# Patient Record
Sex: Female | Born: 1955 | Race: White | Hispanic: No | Marital: Married | Smoking: Never smoker
Health system: Southern US, Community
[De-identification: ages and names within clinical notes are randomized; demographics above are authoritative.]

## PROBLEM LIST (undated history)

## (undated) DIAGNOSIS — E039 Hypothyroidism, unspecified: Secondary | ICD-10-CM

## (undated) DIAGNOSIS — I2699 Other pulmonary embolism without acute cor pulmonale: Secondary | ICD-10-CM

## (undated) DIAGNOSIS — C801 Malignant (primary) neoplasm, unspecified: Secondary | ICD-10-CM

## (undated) DIAGNOSIS — R51 Headache: Secondary | ICD-10-CM

## (undated) DIAGNOSIS — T4145XA Adverse effect of unspecified anesthetic, initial encounter: Secondary | ICD-10-CM

## (undated) DIAGNOSIS — E669 Obesity, unspecified: Secondary | ICD-10-CM

## (undated) DIAGNOSIS — R2243 Localized swelling, mass and lump, lower limb, bilateral: Secondary | ICD-10-CM

## (undated) DIAGNOSIS — R42 Dizziness and giddiness: Secondary | ICD-10-CM

## (undated) DIAGNOSIS — E1169 Type 2 diabetes mellitus with other specified complication: Secondary | ICD-10-CM

## (undated) DIAGNOSIS — R519 Headache, unspecified: Secondary | ICD-10-CM

## (undated) DIAGNOSIS — I10 Essential (primary) hypertension: Secondary | ICD-10-CM

## (undated) DIAGNOSIS — R202 Paresthesia of skin: Secondary | ICD-10-CM

## (undated) DIAGNOSIS — R112 Nausea with vomiting, unspecified: Secondary | ICD-10-CM

## (undated) DIAGNOSIS — R600 Localized edema: Secondary | ICD-10-CM

## (undated) DIAGNOSIS — T8859XA Other complications of anesthesia, initial encounter: Secondary | ICD-10-CM

## (undated) DIAGNOSIS — E785 Hyperlipidemia, unspecified: Secondary | ICD-10-CM

## (undated) DIAGNOSIS — F32A Depression, unspecified: Secondary | ICD-10-CM

## (undated) DIAGNOSIS — M1990 Unspecified osteoarthritis, unspecified site: Secondary | ICD-10-CM

## (undated) DIAGNOSIS — K219 Gastro-esophageal reflux disease without esophagitis: Secondary | ICD-10-CM

## (undated) DIAGNOSIS — Z9889 Other specified postprocedural states: Secondary | ICD-10-CM

## (undated) DIAGNOSIS — M542 Cervicalgia: Secondary | ICD-10-CM

## (undated) DIAGNOSIS — F329 Major depressive disorder, single episode, unspecified: Secondary | ICD-10-CM

## (undated) DIAGNOSIS — R200 Anesthesia of skin: Secondary | ICD-10-CM

## (undated) DIAGNOSIS — R0600 Dyspnea, unspecified: Secondary | ICD-10-CM

## (undated) HISTORY — DX: Hyperlipidemia, unspecified: E78.5

## (undated) HISTORY — DX: Gastro-esophageal reflux disease without esophagitis: K21.9

## (undated) HISTORY — DX: Essential (primary) hypertension: I10

## (undated) HISTORY — PX: OTHER SURGICAL HISTORY: SHX169

## (undated) HISTORY — PX: THYROIDECTOMY: SHX17

---
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
## 2004-12-12 ENCOUNTER — Ambulatory Visit: Payer: Self-pay | Admitting: Family Medicine

## 2006-05-27 ENCOUNTER — Other Ambulatory Visit: Payer: Self-pay

## 2006-05-27 ENCOUNTER — Emergency Department: Payer: Self-pay | Admitting: Emergency Medicine

## 2007-01-22 ENCOUNTER — Ambulatory Visit: Payer: Self-pay | Admitting: Family Medicine

## 2007-01-29 ENCOUNTER — Ambulatory Visit: Payer: Self-pay | Admitting: Family Medicine

## 2007-04-25 ENCOUNTER — Ambulatory Visit: Payer: Self-pay | Admitting: Unknown Physician Specialty

## 2007-06-30 ENCOUNTER — Emergency Department: Payer: Self-pay | Admitting: Emergency Medicine

## 2007-07-25 ENCOUNTER — Ambulatory Visit (HOSPITAL_COMMUNITY): Admission: RE | Admit: 2007-07-25 | Discharge: 2007-07-26 | Payer: Self-pay | Admitting: Surgery

## 2007-07-25 ENCOUNTER — Encounter (INDEPENDENT_AMBULATORY_CARE_PROVIDER_SITE_OTHER): Payer: Self-pay | Admitting: Surgery

## 2007-08-30 ENCOUNTER — Inpatient Hospital Stay: Payer: Self-pay | Admitting: Internal Medicine

## 2007-08-30 ENCOUNTER — Ambulatory Visit: Payer: Self-pay | Admitting: Family Medicine

## 2007-08-30 ENCOUNTER — Other Ambulatory Visit: Payer: Self-pay

## 2007-10-21 ENCOUNTER — Ambulatory Visit (HOSPITAL_COMMUNITY): Admission: RE | Admit: 2007-10-21 | Discharge: 2007-10-21 | Payer: Self-pay | Admitting: Surgery

## 2007-10-23 ENCOUNTER — Ambulatory Visit (HOSPITAL_COMMUNITY): Admission: RE | Admit: 2007-10-23 | Discharge: 2007-10-23 | Payer: Self-pay | Admitting: Surgery

## 2007-11-01 ENCOUNTER — Encounter (HOSPITAL_COMMUNITY): Admission: RE | Admit: 2007-11-01 | Discharge: 2008-01-30 | Payer: Self-pay | Admitting: Surgery

## 2007-12-11 ENCOUNTER — Emergency Department: Payer: Self-pay | Admitting: Emergency Medicine

## 2008-03-19 ENCOUNTER — Ambulatory Visit: Payer: Self-pay | Admitting: Family Medicine

## 2008-07-09 ENCOUNTER — Ambulatory Visit: Payer: Self-pay | Admitting: Family Medicine

## 2008-07-10 ENCOUNTER — Ambulatory Visit: Payer: Self-pay | Admitting: Family Medicine

## 2009-01-26 ENCOUNTER — Ambulatory Visit: Payer: Self-pay | Admitting: Family Medicine

## 2009-04-01 ENCOUNTER — Ambulatory Visit: Payer: Self-pay | Admitting: Family Medicine

## 2009-07-26 ENCOUNTER — Ambulatory Visit: Payer: Self-pay | Admitting: Internal Medicine

## 2009-08-25 ENCOUNTER — Emergency Department: Payer: Self-pay | Admitting: Emergency Medicine

## 2009-08-25 ENCOUNTER — Encounter: Payer: Self-pay | Admitting: Cardiovascular Disease

## 2009-09-22 ENCOUNTER — Ambulatory Visit: Payer: Self-pay | Admitting: Cardiovascular Disease

## 2009-09-22 DIAGNOSIS — E785 Hyperlipidemia, unspecified: Secondary | ICD-10-CM | POA: Insufficient documentation

## 2009-09-22 DIAGNOSIS — R079 Chest pain, unspecified: Secondary | ICD-10-CM | POA: Insufficient documentation

## 2009-09-22 DIAGNOSIS — E119 Type 2 diabetes mellitus without complications: Secondary | ICD-10-CM | POA: Insufficient documentation

## 2009-09-22 DIAGNOSIS — R5383 Other fatigue: Secondary | ICD-10-CM

## 2009-09-22 DIAGNOSIS — R609 Edema, unspecified: Secondary | ICD-10-CM | POA: Insufficient documentation

## 2009-09-22 DIAGNOSIS — I10 Essential (primary) hypertension: Secondary | ICD-10-CM

## 2009-09-22 DIAGNOSIS — R5381 Other malaise: Secondary | ICD-10-CM | POA: Insufficient documentation

## 2010-07-24 ENCOUNTER — Encounter: Payer: Self-pay | Admitting: Surgery

## 2010-08-02 NOTE — Progress Notes (Signed)
Summary: PHI  PHI   Imported By: Harlon Flor 09/23/2009 09:10:17  _____________________________________________________________________  External Attachment:    Type:   Image     Comment:   External Document

## 2010-08-02 NOTE — Assessment & Plan Note (Signed)
Summary: NP6/AMD   Visit Type:  new patient  Referring Provider:  [facility] Primary Provider:  Jerl Mina, MD  CC:  numbness in hands and lower arms. no cp's. no sob. edema everyday...  History of Present Illness: Alisha Sanchez is a pleasant 55 year old woman, patient of Dr. Burnett Sheng,  seen in the emergency room in February of this year for generalized malaise, arm pain, back pain, nausea and "feeling funny all over ".  she had a chest CT scan to rule out PE, chest x-ray, routine blood work which was negative. EKG showed no notable changes. She was discharged home. Since then she has had no further episodes. She has had significant stress at home. She reports occasional nausea, some tingling in her arms bilaterally and below the elbow to her hands on a very occasional basis. 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 Otherwise she feels well  She does not do any exercise program I was very active at baseline and has no significant symptoms as detailed above. No lightheadedness, no dizziness. the nausea comes on sometimes at rest and she is uncertain if this is associated with food   Medications from what she can remember include HCTZ, metformin 1000 mg b.i.d., simvastatin. She's also on a second blood pressure medication. She is uncertain of her doses.  Preventive Screening-Counseling & Management  Alcohol-Tobacco     Alcohol drinks/day: <1     Smoking Status: never  Caffeine-Diet-Exercise     Caffeine use/day: 3      Does Patient Exercise: yes      Drug Use:  no.    Current Problems (verified): 1)  Other and Unspecified Hyperlipidemia  (ICD-272.4) 2)  Unspecified Essential Hypertension  (ICD-401.9) 3)  Chest Pain Unspecified  (ICD-786.50)  Allergies (verified): No Known Drug Allergies  Past History:  Past Medical History: G E R  D Hyperlipidemia Hypertension  Past Surgical History: thyroidectomy 2 c-sections  Family History: Mother: living 82: valve replaced Father: deceased 47: lung cancer and bone marrow cancer and liver cancer Sister: living 21 : heathly Brother: living 73: healthy  Social History: Full Time Married  Tobacco Use - No.  Alcohol Use - yes Regular Exercise - yes Drug Use - no Alcohol drinks/day:  <1 Smoking Status:  never Caffeine use/day:  3  Does Patient Exercise:  yes Drug Use:  no  Review of Systems  The patient denies fatigue, malaise, fever, weight gain/loss, vision loss, decreased hearing, hoarseness, chest pain, palpitations, shortness of breath, prolonged cough, wheezing, sleep apnea, coughing up blood, abdominal pain, blood in stool, nausea, vomiting, diarrhea, heartburn, incontinence, blood in urine, muscle weakness, joint pain, leg swelling, rash, skin lesions, headache, fainting, dizziness, depression, anxiety, enlarged lymph nodes, easy bruising or bleeding, environmental allergies, and dyspnea on exertion.         Nausea, arm tingling and numbness, back pain  Vital Signs:  Patient profile:   55 year old female Height:      67 inches Weight:      210.50 pounds BMI:     33.09 Pulse rate:   87 / minute Pulse rhythm:   regular BP sitting:   140 / 94  (left arm) Cuff size:   large  Vitals Entered By: Alisha Sanchez (September 22, 2009 3:42 PM)  Physical Exam  General:  well-appearing middle-aged woman in no apparent distress,  HENT exam is benign, neck is supple with no JVP or carotid bruits, heart sounds are regular with S1-S2 and no murmurs appreciated, lungs are clear to auscultation with no wheezes Rales, abdominal exam is benign, she has trace to 1+ lower extremity edema to below the knees, neurologic exam grossly nonfocal. Skin is warm and dry. Pulses are equal and symmetrical in her upper and lower extremities.    EKG  Procedure date:   08/25/2009  Findings:      normal sinus rhythm with rate of 81 beats per minute, no significant ST or T wave changes.  Impression & Recommendations:  Problem # 1:  FATIGUE / MALAISE (ICD-780.79) etiology of her symptoms of malaise, nausea or, arm and back pain is uncertain. Back in February when her symptoms last presented, she had negative CT and negative workup at that time. She's otherwise been very reactive and seems to not have any active cardiac symptoms suggestive of ischemia.  I suggested that we hold off on any additional workup unless she has worsening symptoms with exertion to suggest cardiac etiology.  Problem # 2:  DIAB W/O COMP TYPE II/UNS NOT STATED UNCNTRL (ICD-250.00) type 2 diabetes. She states that her sugars have been in the 100s to 130 range. She takes metformin 1000 mg b.i.d. She is uncertain of her cholesterol and takes simvastatin though she does not know the dose. We'll try to obtain a copy of her lipid panel for our records.  Her updated medication list for this problem includes:    Metformin Hcl 1000 Mg Tabs (Metformin hcl) .Marland Kitchen... 1 by mouth once daily  Problem # 3:  EDEMA (ICD-782.3) Her edema is likely due to chronic venous stasis. I suggested that she purchase some TED hose and I have given her a prescription for this. She does comment on having a dry mouth all the time and I've asked her to talk about possibly changing her HCTZ to an alternate blood pressure medication as she states this medication has not significantly helped her edema.  Patient Instructions: 1)  Your physician recommends that you schedule a follow-up appointment as needed. 2)  Your physician has requested you begin wearing TED hose.

## 2010-11-15 NOTE — Op Note (Signed)
NAME:  Alisha Sanchez, Alisha Sanchez          ACCOUNT NO.:  [id number]   MEDICAL RECORD NO.:  [id number]          PATIENT TYPE:  AMB   LOCATION:  DAY                          FACILITY:  [facility]   PHYSICIAN:  Velora Heckler, MD      DATE OF BIRTH:  10/03/1955   DATE OF PROCEDURE:  07/25/2007  DATE OF DISCHARGE:                               OPERATIVE REPORT   PREOPERATIVE DIAGNOSIS:  Thyroid goiter with compressive symptoms.   POSTOPERATIVE DIAGNOSIS:  Thyroid goiter with substernal component,  compressive symptoms.   PROCEDURE:  Total thyroidectomy.   SURGEON:  Velora Heckler, M.D., FACS   ASSISTANT:  Ollen Gross. Eudelia Bunch., M.D., FACS   ANESTHESIA:  General.   ESTIMATED BLOOD LOSS:  Minimal.   PREPARATION:  Betadine.   COMPLICATIONS:  None.   INDICATIONS:  The patient is a 55 year old white female from [place], [place].  She developed compressive symptoms due to thyroid  enlargement.  She first noted symptoms June 2008.  She was evaluated by  Dr. Silver Huguenin at the [facility].  The patient has both solid  and liquid food dysphagia.  She now comes to surgery for thyroidectomy.   BODY OF REPORT:  Procedure was done in OR #1 at the [facility].  The patient was brought to the operating room and  placed in supine position on the operating room table.  Following  administration of general anesthesia, the patient was positioned and  then prepped and draped in usual strict aseptic fashion.  After  ascertaining that an adequate level of anesthesia had been achieved, a  Kocher incision was made a #15 blade.  Dissection was carried through  subcutaneous tissues and platysma.  Hemostasis was obtained with  electrocautery.  Skin flaps were elevated cephalad and caudad from the  thyroid notch to the sternal notch.  A Mahorner self-retaining retractor  was placed for exposure.  Strap muscles were incised in the midline, and  dissection was begun on the left side.  There  was a large nodule in the  isthmus.  There was a large nodule occupying the left thyroid lobe.  There was a large posterior nodule extending behind the esophagus to the  precervical fascia.  There was an inferior component extending into the  posterior mediastinum near to the base of the heart.  With gentle blunt  dissection, this was mobilized.  Middle thyroid vein was divided between  Ligaclips with the harmonic scalpel.  Inferior venous tributaries were  divided between hemoclips with the harmonic scalpel.  Superior pole  vessels were dissected out and ligated in continuity with 2-0 silk ties  and medium hemoclips and divided with the harmonic scalpel.  Gland was  gradually rolled anteriorly.  Both superior and inferior parathyroid  glands were identified and preserved on their vascular pedicle.  Care  was taken to avoid the recurrent laryngeal nerve.  Gland was delivered  out of the posterior mediastinum.  It was rolled anteriorly.  Branches  of the inferior thyroid artery were divided between small hemoclips with  the harmonic scalpel.  Ligament of Allyson Sabal was  transected, and the gland  was mobilized up and onto the anterior trachea.  There was no  significant pyramidal lobe.  Gland was mobilized across the midline.  Inferior venous tributaries in the midline were ligated in continuity  with 2-0 silk ties and divided with the harmonic scalpel.  Dry pack was  placed in the left neck.   Next, we turned our attention to the right thyroid lobe.  Again strap  muscles were reflected laterally.  Right lobe was mobilized with a  Administrator[employer].  Anatomy was much more normal on the right side.  Middle vein was divided between hemoclips with the harmonic scalpel.  Superior pole vessels were dissected out, ligated in continuity with 2-0  silk ties, and medium hemoclips and divided with the harmonic scalpel.  Gland was rolled anteriorly.  Branches of the inferior thyroid artery  were divided  between small hemoclips.  Parathyroid tissue and recurrent  nerve were identified and preserved.  Ligament of Allyson Sabal was transected  with electrocautery, and the gland was delivered up and onto the  anterior trachea.  Remaining inferior venous tributaries were ligated  with a 2-0 silk tie and divided with the harmonic scalpel.  Specimen was  marked with a suture at the left superior pole.  The entire thyroid  gland and associated substernal component were submitted to pathology  for review.  Good hemostasis was obtained after irrigating the neck.  Surgicel was placed in the operative field bilaterally.  Strap muscles  were reapproximated in the midline with interrupted 3-0 Vicryl sutures.  Platysma was closed with interrupted 3-0 Vicryl sutures.  Skin was  closed with a running 4-0 Monocryl subcuticular suture.  Wound was  washed and dried, and Benzoin and Steri-Strips were applied.  Sterile  dressings were applied.  The patient was awakened from anesthesia and  brought to the recovery room in stable condition.  The patient tolerated  the procedure well.      Velora Heckler, MD  Electronically Signed     TMG/MEDQ  D:  07/25/2007  T:  07/25/2007  Job:  [id number]   cc:   Silver Huguenin, M.D.  [facility]  [address] [place]  Fax: [phone]   Velora Heckler, MD  [phone] [place]  [place] [address]

## 2011-01-17 ENCOUNTER — Encounter: Payer: Self-pay | Admitting: Cardiovascular Disease

## 2011-03-01 ENCOUNTER — Ambulatory Visit: Payer: Self-pay | Admitting: Family Medicine

## 2011-03-06 ENCOUNTER — Emergency Department: Payer: Self-pay | Admitting: Emergency Medicine

## 2011-03-23 LAB — CBC
MCHC: 34.9
MCV: 80.1
Platelets: 403 — ABNORMAL HIGH
WBC: 6.7

## 2011-03-23 LAB — BASIC METABOLIC PANEL
CO2: 26
Calcium: 9.5
Creatinine, Ser: 0.72
GFR calc non Af Amer: >60
Glucose, Bld: 112 — ABNORMAL HIGH

## 2011-03-23 LAB — URINALYSIS, ROUTINE W REFLEX MICROSCOPIC
Glucose, UA: NEGATIVE
Ketones, ur: NEGATIVE
Protein, ur: NEGATIVE
Urobilinogen, UA: 0.2

## 2011-03-23 LAB — DIFFERENTIAL
Basophils Relative: 1
Eosinophils Absolute: 0.1
Neutrophils Relative %: 62

## 2011-03-23 LAB — PROTIME-INR
INR: 0.9
Prothrombin Time: 12.2

## 2011-04-17 ENCOUNTER — Emergency Department: Payer: Self-pay | Admitting: Emergency Medicine

## 2011-07-20 ENCOUNTER — Emergency Department (HOSPITAL_COMMUNITY): Payer: BC Managed Care – PPO

## 2011-07-20 ENCOUNTER — Encounter (HOSPITAL_COMMUNITY): Payer: Self-pay | Admitting: Emergency Medicine

## 2011-07-20 ENCOUNTER — Emergency Department (HOSPITAL_COMMUNITY)
Admission: EM | Admit: 2011-07-20 | Discharge: 2011-07-20 | Disposition: A | Discharge status: Home / Self Care | Payer: BC Managed Care – PPO | Attending: Emergency Medicine | Admitting: Emergency Medicine

## 2011-07-20 DIAGNOSIS — E669 Obesity, unspecified: Secondary | ICD-10-CM | POA: Insufficient documentation

## 2011-07-20 DIAGNOSIS — E119 Type 2 diabetes mellitus without complications: Secondary | ICD-10-CM | POA: Insufficient documentation

## 2011-07-20 DIAGNOSIS — R51 Headache: Secondary | ICD-10-CM | POA: Insufficient documentation

## 2011-07-20 DIAGNOSIS — K219 Gastro-esophageal reflux disease without esophagitis: Secondary | ICD-10-CM | POA: Insufficient documentation

## 2011-07-20 DIAGNOSIS — I10 Essential (primary) hypertension: Secondary | ICD-10-CM | POA: Insufficient documentation

## 2011-07-20 DIAGNOSIS — Z86718 Personal history of other venous thrombosis and embolism: Secondary | ICD-10-CM | POA: Insufficient documentation

## 2011-07-20 DIAGNOSIS — E785 Hyperlipidemia, unspecified: Secondary | ICD-10-CM | POA: Insufficient documentation

## 2011-07-20 DIAGNOSIS — R519 Headache, unspecified: Secondary | ICD-10-CM

## 2011-07-20 HISTORY — DX: Type 2 diabetes mellitus with other specified complication: E66.9

## 2011-07-20 HISTORY — DX: Type 2 diabetes mellitus with other specified complication: E11.69

## 2011-07-20 HISTORY — DX: Other pulmonary embolism without acute cor pulmonale: I26.99

## 2011-07-20 MED ORDER — SODIUM CHLORIDE 0.9 % IV BOLUS (SEPSIS)
1000.0000 mL | Freq: Once | INTRAVENOUS | Status: AC
  Administered 2011-07-20: 1000 mL via INTRAVENOUS

## 2011-07-20 MED ORDER — ACETAMINOPHEN 500 MG PO TABS
1000.0000 mg | ORAL_TABLET | Freq: Once | ORAL | Status: AC
  Administered 2011-07-20: 1000 mg via ORAL
  Filled 2011-07-20: qty 2

## 2011-07-20 NOTE — Discharge Instructions (Signed)
CT scan of head was normal. Rest. Increase fluids. Tylenol or ibuprofen for headache

## 2011-07-20 NOTE — ED Notes (Signed)
Pt placed on 2 LMP [place] per order. Pt states headache is worsening. Tylenol given per orders.

## 2011-07-20 NOTE — ED Notes (Signed)
Pt states sudden onset of severe pain at base of head that radiated up to top of forehead from back of head. Pt states has nausea but denies vomiting. Pt complaining of sensitivity to light and blurred vision. Pt complaining of dull ache with head heaviness at present time.

## 2011-07-20 NOTE — ED Provider Notes (Signed)
History   This chart was scribed for Alisha Hutching, MD by Clarita Crane. The patient was seen in room APA06/APA06 and the patient's care was started at 11:17AM.   CSN: [id number]  Arrival date & time 07/20/11  1035   First MD Initiated Contact with Patient 07/20/11 1054      Chief Complaint  Patient presents with  . Headache    (Consider location/radiation/quality/duration/timing/severity/associated sxs/prior treatment) HPI Alisha MCARTHY is a 56 y.o. female who presents to the Emergency Department complaining of constant moderate to severe HA described as shooting and throbbing which extended from inferior aspect of neck radiating to posterior aspect of head and persistent since but moderately improved since initial onset. Reports HA began suddenly when she went from seated position to standing position. Denies weakness, confusion, aphasia. Patient with h/o GERD, HLD, HTN, diabetes- type 2, PE.   Past Medical History  Diagnosis Date  . GERD (gastroesophageal reflux disease)   . HLD (hyperlipidemia)   . HTN (hypertension)   . Diabetes mellitus type 2 in obese   . Pulmonary embolus     Past Surgical History  Procedure Date  . Thyroidectomy   . C-sections     x2     Family History  Problem Relation Age of Onset  . Liver cancer Father   . Lung cancer Father   . Diabetes Father   . Stroke Other     History  Substance Use Topics  . Smoking status: Never Smoker   . Smokeless tobacco: Not on file   Comment: tobacco use- no  . Alcohol Use: Yes     occassional wine    OB History    Grav Para Term Preterm Abortions TAB SAB Ect Mult Living                  Review of Systems 10 Systems reviewed and are negative for acute change except as noted in the HPI.  Allergies  Review of patient's allergies indicates no known allergies.  Home Medications   Current Outpatient Rx  Name Route Sig Dispense Refill  . AMLODIPINE BESYLATE 5 MG PO TABS Oral Take 5 mg by mouth  daily.    . CYCLOBENZAPRINE HCL 5 MG PO TABS Oral Take 5 mg by mouth 3 (three) times daily as needed. Muscle spasms    . LEVOTHYROXINE SODIUM 112 MCG PO TABS Oral Take 112 mcg by mouth daily.    Marland Kitchen METFORMIN HCL ER 500 MG PO TB24 Oral Take 500 mg by mouth daily with breakfast.    . ONDANSETRON HCL 4 MG PO TABS Oral Take 4 mg by mouth every 8 (eight) hours as needed. nausea    . PANTOPRAZOLE SODIUM 40 MG PO TBEC Oral Take 40 mg by mouth daily.    Marland Kitchen ROSUVASTATIN CALCIUM 10 MG PO TABS Oral Take 10 mg by mouth daily.    . VENLAFAXINE HCL ER 37.5 MG PO CP24 Oral Take 37.5 mg by mouth daily.      BP 142/78  Pulse 80  Temp(Src) 98 F (36.7 C) (Oral)  Resp 18  Ht 5\' 7"  (1.702 m)  Wt 211 lb (95.709 kg)  BMI 33.05 kg/m2  SpO2 98%  Physical Exam  Nursing note and vitals reviewed. Constitutional: She is oriented to person, place, and time. She appears well-developed and well-nourished. No distress.  HENT:  Head: Normocephalic and atraumatic.  Eyes: EOM are normal. Pupils are equal, round, and reactive to light. Right eye exhibits no discharge.  Left eye exhibits no discharge. No scleral icterus.  Neck: Neck supple. No tracheal deviation present.  Cardiovascular: Normal rate and regular rhythm.   No murmur heard. Pulmonary/Chest: Effort normal. No respiratory distress. She has no wheezes.  Abdominal: Soft. She exhibits no distension.  Musculoskeletal: Normal range of motion. She exhibits no edema.  Neurological: She is alert and oriented to person, place, and time. No sensory deficit.  Skin: Skin is warm and dry.  Psychiatric: She has a normal mood and affect. Her behavior is normal.    ED Course  Procedures (including critical care time)  DIAGNOSTIC STUDIES: Oxygen Saturation is 98% on room air, normal by my interpretation.    COORDINATION OF CARE: 11:19AM- Patient explained treatment plan at this time and intent to obtain CT-Head. Patient agrees with plan set forth at this time.     Labs Reviewed - No data to display Ct Head Wo Contrast  07/20/2011  *RADIOLOGY REPORT*  Clinical Data: Severe occipital headache, history of diabetes, hypertension, GERD, thyroidectomy, pulmonary embolism  CT HEAD WITHOUT CONTRAST  Technique:  Contiguous axial images were obtained from the base of the skull through the vertex without contrast.  Comparison: None  Findings: Normal ventricular morphology. No midline shift or mass effect. Normal appearance of brain parenchyma. No intracranial hemorrhage, mass lesion, or acute infarction. Visualized paranasal sinuses and mastoid air cells clear. Bones demineralized.  IMPRESSION: No acute intracranial abnormalities.  Original Report Authenticated By: Lollie Marrow, M.D.     No diagnosis found.    MDM  Patient has normal neurological exam. Head CT normal. Headache dramatically decreased.       I personally performed the services described in this documentation, which was scribed in my presence. The recorded information has been reviewed and considered.    Alisha Hutching, MD 07/20/11 1426

## 2012-03-05 ENCOUNTER — Ambulatory Visit: Payer: Self-pay | Admitting: Family Medicine

## 2013-08-11 ENCOUNTER — Emergency Department: Payer: Self-pay | Admitting: Emergency Medicine

## 2013-08-11 LAB — BASIC METABOLIC PANEL
Anion Gap: 8 (ref 7–16)
BUN: 14 mg/dL (ref 7–18)
CHLORIDE: 105 mmol/L (ref 98–107)
CO2: 26 mmol/L (ref 21–32)
Calcium, Total: 9 mg/dL (ref 8.5–10.1)
Creatinine: 0.77 mg/dL (ref 0.60–1.30)
EGFR (African American): >60
GLUCOSE: 172 mg/dL — AB (ref 65–99)
OSMOLALITY: 282 (ref 275–301)
POTASSIUM: 3.2 mmol/L — AB (ref 3.5–5.1)
Sodium: 139 mmol/L (ref 136–145)

## 2013-08-11 LAB — CBC
HCT: 39.1 % (ref 35.0–47.0)
HGB: 13.8 g/dL (ref 12.0–16.0)
MCH: 28.6 pg (ref 26.0–34.0)
MCHC: 35.2 g/dL (ref 32.0–36.0)
MCV: 81 fL (ref 80–100)
PLATELETS: 278 10*3/uL (ref 150–440)
RBC: 4.82 10*6/uL (ref 3.80–5.20)
RDW: 14.8 % — AB (ref 11.5–14.5)
WBC: 6 10*3/uL (ref 3.6–11.0)

## 2013-08-11 LAB — TROPONIN I: Troponin-I: <0.02 ng/mL

## 2013-08-11 LAB — PRO B NATRIURETIC PEPTIDE: B-TYPE NATIURETIC PEPTID: 16 pg/mL (ref 0–125)

## 2013-09-09 ENCOUNTER — Ambulatory Visit: Payer: Self-pay | Admitting: Ophthalmology

## 2013-09-09 LAB — POTASSIUM: POTASSIUM: 3.2 mmol/L — AB (ref 3.5–5.1)

## 2013-09-29 ENCOUNTER — Ambulatory Visit: Payer: Self-pay | Admitting: Ophthalmology

## 2014-09-07 ENCOUNTER — Other Ambulatory Visit: Payer: Self-pay | Admitting: Adult Health

## 2014-10-24 NOTE — Op Note (Signed)
PATIENT NAME:  Alisha Sanchez, Alisha Sanchez MR#:  [id number] DATE OF BIRTH:  1955/11/15  DATE OF PROCEDURE:  09/29/2013  PREOPERATIVE DIAGNOSIS:  Cataract, right eye.    POSTOPERATIVE DIAGNOSIS:  Cataract, right eye.  PROCEDURE PERFORMED:  Extracapsular cataract extraction using phacoemulsification with placement of an Alcon SN6CWF, 20.5-diopter posterior chamber lens, serial number [id number].  SURGEON:  Loura Back. Lis Savitt, MD  ASSISTANT:  None.  ANESTHESIA:  4% lidocaine and 0.75% Marcaine in a 50/50 mixture with Hylenex 10 units/mL added, given as a peribulbar.  ANESTHESIOLOGIST:  Dr. Myra Gianotti.   COMPLICATIONS:  None.  ESTIMATED BLOOD LOSS:  Less than 1 ml.  DESCRIPTION OF PROCEDURE:  The patient was brought to the operating room and given a peribulbar block.  The patient was then prepped and draped in the usual fashion.  The vertical rectus muscles were imbricated using 5-0 silk sutures.  These sutures were then clamped to the sterile drapes as bridle sutures.  A limbal peritomy was performed extending two clock hours and hemostasis was obtained with cautery.  A partial thickness scleral groove was made at the surgical limbus and dissected anteriorly in a lamellar dissection using an Alcon crescent knife.  The anterior chamber was entered supero-temporally with a Superblade and through the lamellar dissection with a 2.6 mm keratome.  DisCoVisc was used to replace the aqueous and a continuous tear capsulorrhexis was carried out.  Hydrodissection and hydrodelineation were carried out with balanced salt and a 27 gauge canula.  The nucleus was rotated to confirm the effectiveness of the hydrodissection.  Phacoemulsification was carried out using a divide-and-conquer technique.  Total ultrasound time was 1 minute and 8 seconds with an average power of 24.3 percent.  CDE 27.78.    Irrigation/aspiration was used to remove the residual cortex.  DisCoVisc was used to inflate the capsule and the  internal incision was enlarged to 3 mm with the crescent knife.  The intraocular lens was folded and inserted into the capsular bag using the AcrySert delivery system.  Irrigation/aspiration was used to remove the residual DisCoVisc.  Miostat was injected into the anterior chamber through the paracentesis track to inflate the anterior chamber and induce miosis.  Cefuroxime 0.1 mL containing 1 mg of drug was injected via the paracentesis track.  The wound was checked for leaks and none were found. No suture was added.  The conjunctiva was closed with cautery and the bridle sutures were removed.  Two drops of 0.3% Vigamox were placed on the eye.   An eye shield was placed on the eye.  The patient was discharged to the recovery room in good condition.  ____________________________ Loura Back Irean Kendricks, MD sad:cs D: 09/29/2013 13:23:13 ET T: 09/29/2013 15:36:03 ET JOB#: [id number]  cc: Remo Lipps A. Mackinley Cassaday, MD, <Dictator> Martie Lee MD ELECTRONICALLY SIGNED 10/06/2013 12:16

## 2015-03-19 ENCOUNTER — Other Ambulatory Visit: Payer: Self-pay | Admitting: Family Medicine

## 2015-03-19 DIAGNOSIS — Z1231 Encounter for screening mammogram for malignant neoplasm of breast: Secondary | ICD-10-CM

## 2015-04-02 ENCOUNTER — Ambulatory Visit
Admission: RE | Admit: 2015-04-02 | Discharge: 2015-04-02 | Disposition: A | Discharge status: Home / Self Care | Payer: BC Managed Care – PPO | Source: Ambulatory Visit | Attending: Family Medicine | Admitting: Family Medicine

## 2015-04-02 DIAGNOSIS — Z1231 Encounter for screening mammogram for malignant neoplasm of breast: Secondary | ICD-10-CM | POA: Diagnosis not present

## 2015-10-27 ENCOUNTER — Emergency Department
Admission: EM | Admit: 2015-10-27 | Discharge: 2015-10-27 | Disposition: A | Discharge status: Home / Self Care | Payer: BC Managed Care – PPO | Attending: Emergency Medicine | Admitting: Emergency Medicine

## 2015-10-27 ENCOUNTER — Encounter: Payer: Self-pay | Admitting: Radiology

## 2015-10-27 ENCOUNTER — Emergency Department: Payer: BC Managed Care – PPO

## 2015-10-27 ENCOUNTER — Other Ambulatory Visit: Payer: Self-pay

## 2015-10-27 DIAGNOSIS — E119 Type 2 diabetes mellitus without complications: Secondary | ICD-10-CM | POA: Insufficient documentation

## 2015-10-27 DIAGNOSIS — Z7984 Long term (current) use of oral hypoglycemic drugs: Secondary | ICD-10-CM | POA: Diagnosis not present

## 2015-10-27 DIAGNOSIS — E785 Hyperlipidemia, unspecified: Secondary | ICD-10-CM | POA: Diagnosis not present

## 2015-10-27 DIAGNOSIS — M25512 Pain in left shoulder: Secondary | ICD-10-CM

## 2015-10-27 DIAGNOSIS — Z86711 Personal history of pulmonary embolism: Secondary | ICD-10-CM | POA: Diagnosis not present

## 2015-10-27 DIAGNOSIS — I10 Essential (primary) hypertension: Secondary | ICD-10-CM | POA: Diagnosis not present

## 2015-10-27 DIAGNOSIS — M5412 Radiculopathy, cervical region: Secondary | ICD-10-CM | POA: Diagnosis not present

## 2015-10-27 DIAGNOSIS — Z79899 Other long term (current) drug therapy: Secondary | ICD-10-CM | POA: Insufficient documentation

## 2015-10-27 HISTORY — DX: Malignant (primary) neoplasm, unspecified: C80.1

## 2015-10-27 LAB — COMPREHENSIVE METABOLIC PANEL
ALT: 18 U/L (ref 14–54)
AST: 19 U/L (ref 15–41)
Albumin: 4.2 g/dL (ref 3.5–5.0)
Alkaline Phosphatase: 50 U/L (ref 38–126)
Anion gap: 9 (ref 5–15)
BILIRUBIN TOTAL: 0.7 mg/dL (ref 0.3–1.2)
BUN: 14 mg/dL (ref 6–20)
CHLORIDE: 107 mmol/L (ref 101–111)
CO2: 23 mmol/L (ref 22–32)
CREATININE: 0.59 mg/dL (ref 0.44–1.00)
Calcium: 9 mg/dL (ref 8.9–10.3)
Glucose, Bld: 162 mg/dL — ABNORMAL HIGH (ref 65–99)
POTASSIUM: 3.5 mmol/L (ref 3.5–5.1)
Sodium: 139 mmol/L (ref 135–145)
TOTAL PROTEIN: 7.3 g/dL (ref 6.5–8.1)

## 2015-10-27 LAB — CBC WITH DIFFERENTIAL/PLATELET
Basophils Absolute: 0.1 10*3/uL (ref 0–0.1)
Basophils Relative: 2 %
EOS PCT: 2 %
Eosinophils Absolute: 0.1 10*3/uL (ref 0–0.7)
HCT: 39.2 % (ref 35.0–47.0)
Hemoglobin: 13.6 g/dL (ref 12.0–16.0)
LYMPHS ABS: 0.8 10*3/uL — AB (ref 1.0–3.6)
LYMPHS PCT: 14 %
MCH: 28.1 pg (ref 26.0–34.0)
MCHC: 34.6 g/dL (ref 32.0–36.0)
MCV: 81.2 fL (ref 80.0–100.0)
MONO ABS: 0.4 10*3/uL (ref 0.2–0.9)
MONOS PCT: 6 %
Neutro Abs: 4.6 10*3/uL (ref 1.4–6.5)
Neutrophils Relative %: 76 %
PLATELETS: 236 10*3/uL (ref 150–440)
RBC: 4.83 MIL/uL (ref 3.80–5.20)
RDW: 14.1 % (ref 11.5–14.5)
WBC: 6 10*3/uL (ref 3.6–11.0)

## 2015-10-27 LAB — FIBRIN DERIVATIVES D-DIMER (ARMC ONLY): FIBRIN DERIVATIVES D-DIMER (ARMC): 289 (ref 0–499)

## 2015-10-27 LAB — TROPONIN I: Troponin I: <0.03 ng/mL (ref <0.031)

## 2015-10-27 MED ORDER — MORPHINE SULFATE (PF) 4 MG/ML IV SOLN
4.0000 mg | Freq: Once | INTRAVENOUS | Status: AC
  Administered 2015-10-27: 4 mg via INTRAVENOUS
  Filled 2015-10-27: qty 1

## 2015-10-27 MED ORDER — HYDROCODONE-ACETAMINOPHEN 5-325 MG PO TABS: 1.0000 | ORAL_TABLET | ORAL | Status: DC | PRN

## 2015-10-27 MED ORDER — IOPAMIDOL (ISOVUE-370) INJECTION 76%
100.0000 mL | Freq: Once | INTRAVENOUS | Status: AC | PRN
  Administered 2015-10-27: 80 mL via INTRAVENOUS

## 2015-10-27 MED ORDER — DOCUSATE SODIUM 100 MG PO CAPS: ORAL_CAPSULE | ORAL | Status: DC

## 2015-10-27 MED ORDER — SODIUM CHLORIDE 0.9 % IV BOLUS (SEPSIS)
1000.0000 mL | INTRAVENOUS | Status: AC
  Administered 2015-10-27: 1000 mL via INTRAVENOUS

## 2015-10-27 MED ORDER — ONDANSETRON HCL 4 MG/2ML IJ SOLN
4.0000 mg | Freq: Once | INTRAMUSCULAR | Status: AC
Start: 2015-10-27 — End: 2015-10-27
  Administered 2015-10-27: 4 mg via INTRAVENOUS
  Filled 2015-10-27: qty 2

## 2015-10-27 MED ORDER — ONDANSETRON HCL 4 MG PO TABS: ORAL_TABLET | ORAL | Status: DC

## 2015-10-27 NOTE — ED Provider Notes (Signed)
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 I ordered a second troponin and this was also negative. Patient is feeling better at present discharge her as planned by Dr. Bosie Helper, MD 10/27/15 [phone]

## 2015-10-27 NOTE — ED Notes (Signed)
Report recvd from [place], Therapist, sports. Pt still needs a 20guage IV for CT angio. Pt has been stuck three times for access.

## 2015-10-27 NOTE — Discharge Instructions (Signed)
As we discussed, your workup today was reassuring.  Though we do not know exactly what is causing your symptoms, it appears that you have no emergent medical condition at this time are safe to go home and follow up as recommended in this paperwork.  Although much of your symptoms sounds musculoskeletal in nature, you may also be suffering from cervical radiculopathy (please read through the information listed below).  We recommend that you follow-up either with your regular doctor or with an orthopedic specialist to discuss additional management options, particularly if you continued to have problems with the left shoulder and neck.  Please return immediately to the Emergency Department if you develop any new or worsening symptoms that concern you.   Cervical Radiculopathy Cervical radiculopathy happens when a nerve in the neck (cervical nerve) is pinched or bruised. This condition can develop because of an injury or as part of the normal aging process. Pressure on the cervical nerves can cause pain or numbness that runs from the neck all the way down into the arm and fingers. Usually, this condition gets better with rest. Treatment may be needed if the condition does not improve.  CAUSES This condition may be caused by:  Injury.  Slipped (herniated) disk.  Muscle tightness in the neck because of overuse.  Arthritis.  Breakdown or degeneration in the bones and joints of the spine (spondylosis) due to aging.  Bone spurs that may develop near the cervical nerves. SYMPTOMS Symptoms of this condition include:  Pain that runs from the neck to the arm and hand. The pain can be severe or irritating. It may be worse when the neck is moved.  Numbness or weakness in the affected arm and hand. DIAGNOSIS This condition may be diagnosed based on symptoms, medical history, and a physical exam. You may also have tests, including:  X-rays.  CT scan.  MRI.  Electromyogram (EMG).  Nerve  conduction tests. TREATMENT In many cases, treatment is not needed for this condition. With rest, the condition usually gets better over time. If treatment is needed, options may include:  Wearing a soft neck collar for short periods of time.  Physical therapy to strengthen your neck muscles.  Medicines, such as NSAIDs, oral corticosteroids, or spinal injections.  Surgery. This may be needed if other treatments do not help. Various types of surgery may be done depending on the cause of your problems. HOME CARE INSTRUCTIONS Managing Pain  Take over-the-counter and prescription medicines only as told by your health care provider.  If directed, apply ice to the affected area.  Put ice in a plastic bag.  Place a towel between your skin and the bag.  Leave the ice on for 20 minutes, 2-3 times per day.  If ice does not help, you can try using heat. Take a warm shower or warm bath, or use a heat pack as told by your health care provider.  Try a gentle neck and shoulder massage to help relieve symptoms. Activity  Rest as needed. Follow instructions from your health care provider about any restrictions on activities.  Do stretching and strengthening exercises as told by your health care provider or physical therapist. General Instructions  If you were given a soft collar, wear it as told by your health care provider.  Use a flat pillow when you sleep.  Keep all follow-up visits as told by your health care provider. This is important. SEEK MEDICAL CARE IF:  Your condition does not improve with treatment. SEEK IMMEDIATE  MEDICAL CARE IF:  Your pain gets much worse and cannot be controlled with medicines.  You have weakness or numbness in your hand, arm, face, or leg.  You have a high fever.  You have a stiff, rigid neck.  You lose control of your bowels or your bladder (have incontinence).  You have trouble with walking, balance, or speaking.   This information is not  intended to replace advice given to you by your health care provider. Make sure you discuss any questions you have with your health care provider.   Document Released: 03/14/2001 Document Revised: 03/10/2015 Document Reviewed: 08/13/2014 Elsevier Interactive Patient Education 2016 [place] [facility] Joint pain, which is also called arthralgia, can be caused by many things. Joint pain often goes away when you follow your health care provider's instructions for relieving pain at home. However, joint pain can also be caused by conditions that require further treatment. Common causes of joint pain include:  Bruising in the area of the joint.  Overuse of the joint.  Wear and tear on the joints that occur with aging (osteoarthritis).  Various other forms of arthritis.  A buildup of a crystal form of uric acid in the joint (gout).  Infections of the joint (septic arthritis) or of the bone (osteomyelitis). Your health care provider may recommend medicine to help with the pain. If your joint pain continues, additional tests may be needed to diagnose your condition. HOME CARE INSTRUCTIONS Watch your condition for any changes. Follow these instructions as directed to lessen the pain that you are feeling.  Take medicines only as directed by your health care provider.  Rest the affected area for as long as your health care provider says that you should. If directed to do so, raise the painful joint above the level of your heart while you are sitting or lying down.  Do not do things that cause or worsen pain.  If directed, apply ice to the painful area:  Put ice in a plastic bag.  Place a towel between your skin and the bag.  Leave the ice on for 20 minutes, 2-3 times per day.  Wear an elastic bandage, splint, or sling as directed by your health care provider. Loosen the elastic bandage or splint if your fingers or toes become numb and tingle, or if they turn cold and blue.  Begin  exercising or stretching the affected area as directed by your health care provider. Ask your health care provider what types of exercise are safe for you.  Keep all follow-up visits as directed by your health care provider. This is important. SEEK MEDICAL CARE IF:  Your pain increases, and medicine does not help.  Your joint pain does not improve within 3 days.  You have increased bruising or swelling.  You have a fever.  You lose 10 lb (4.5 kg) or more without trying. SEEK IMMEDIATE MEDICAL CARE IF:  You are not able to move the joint.  Your fingers or toes become numb or they turn cold and blue.   This information is not intended to replace advice given to you by your health care provider. Make sure you discuss any questions you have with your health care provider.   Document Released: 06/19/2005 Document Revised: 07/10/2014 Document Reviewed: 03/31/2014 Elsevier Interactive Patient Education 2016 [place] therapy can help ease sore, stiff, injured, and tight muscles and joints. Heat relaxes your muscles, which may help ease your pain.  RISKS AND  COMPLICATIONS If you have any of the following conditions, do not use heat therapy unless your health care provider has approved:  Poor circulation.  Healing wounds or scarred skin in the area being treated.  Diabetes, heart disease, or high blood pressure.  Not being able to feel (numbness) the area being treated.  Unusual swelling of the area being treated.  Active infections.  Blood clots.  Cancer.  Inability to communicate pain. This may include young children and people who have problems with their brain function (dementia).  Pregnancy. Heat therapy should only be used on old, pre-existing, or long-lasting (chronic) injuries. Do not use heat therapy on new injuries unless directed by your health care provider. HOW TO USE HEAT THERAPY There are several different kinds of heat therapy,  including:  Moist heat pack.  Warm water bath.  Hot water bottle.  Electric heating pad.  Heated gel pack.  Heated wrap.  Electric heating pad. Use the heat therapy method suggested by your health care provider. Follow your health care provider's instructions on when and how to use heat therapy. GENERAL HEAT THERAPY RECOMMENDATIONS  Do not sleep while using heat therapy. Only use heat therapy while you are awake.  Your skin may turn pink while using heat therapy. Do not use heat therapy if your skin turns red.  Do not use heat therapy if you have new pain.  High heat or long exposure to heat can cause burns. Be careful when using heat therapy to avoid burning your skin.  Do not use heat therapy on areas of your skin that are already irritated, such as with a rash or sunburn. SEEK MEDICAL CARE IF:  You have blisters, redness, swelling, or numbness.  You have new pain.  Your pain is worse. MAKE SURE YOU:  Understand these instructions.  Will watch your condition.  Will get help right away if you are not doing well or get worse.   This information is not intended to replace advice given to you by your health care provider. Make sure you discuss any questions you have with your health care provider.   Document Released: 09/11/2011 Document Revised: 07/10/2014 Document Reviewed: 08/12/2013 Elsevier Interactive Patient Education [employer].

## 2015-10-27 NOTE — ED Provider Notes (Signed)
[facility] Emergency Department Provider Note  ____________________________________________  Time seen: Approximately 4:27 AM  I have reviewed the triage vital signs and the nursing notes.   HISTORY  Chief Complaint Neck Pain    HPI Alisha Sanchez is a 60 y.o. female with a past medical history that includes multiple pulmonary emboli about 9 years ago, type 2 diabetes, hypertension, and hyperlipidemia who presents with a variety of complaints that includes left-sided neck pain radiating down her left arm with some numbness and tingling as well as nausea and some shortness of breath.  1)  neck pain and left arm pain/tingling:  The patient states that she has had pain in her neck for a week or more and is seeing her primary care doctor who prescribed some muscle relaxers but they did not help.  The pain has radiated down her arm over the last 2 days and it is severe this morning, aching, and movement makes it worse and nothing makes it better.  Acute symptoms are the tingling down into her left hand and she has never experienced that before.  She describes the neck pain as severe, the tingling as mild.  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  She states that she is very nauseated and she feels some pressure and discomfort in the center of her chest up high or possibly at the bottom of her neck.  She feels like something is stuck there although she did not take any medications.  She states that this feels just like when she had audible pulmonary emboli about 9 years ago.  She was on anticoagulation for about a year but she has not had any subsequent blood clot although she has been evaluated multiple times to make sure they are not recurring.She describes the nausea is severe and the shortness of breath is mild.  She denies abdominal pain,  dysuria, fever/chills, headache.    Past Medical History  Diagnosis Date  . GERD (gastroesophageal reflux disease)   . HLD (hyperlipidemia)   . HTN (hypertension)   . Diabetes mellitus type 2 in obese   . Pulmonary embolus     Patient Active Problem List   Diagnosis Date Noted  . DIAB W/O COMP TYPE II/UNS NOT STATED UNCNTRL 09/22/2009  . OTHER AND UNSPECIFIED HYPERLIPIDEMIA 09/22/2009  . UNSPECIFIED ESSENTIAL HYPERTENSION 09/22/2009  . FATIGUE / MALAISE 09/22/2009  . EDEMA 09/22/2009  . CHEST PAIN UNSPECIFIED 09/22/2009    Past Surgical History  Procedure Laterality Date  . Thyroidectomy    . C-sections      x2     Current Outpatient Rx  Name  Route  Sig  Dispense  Refill  . amLODipine (NORVASC) 5 MG tablet   Oral   Take 5 mg by mouth daily.         . cyclobenzaprine (FLEXERIL) 5 MG tablet   Oral   Take 5 mg by mouth 3 (three) times daily as needed. Muscle spasms         . docusate sodium (COLACE) 100 MG capsule      Take 1 tablet once or twice daily as needed for constipation while taking narcotic pain medicine   30 capsule   0   . HYDROcodone-acetaminophen (NORCO/VICODIN) 5-325 MG tablet   Oral   Take 1-2 tablets by mouth every 4 (four) hours as needed for  moderate pain.   15 tablet   0   . levothyroxine (SYNTHROID, LEVOTHROID) 112 MCG tablet   Oral   Take 112 mcg by mouth daily.         . metFORMIN (GLUCOPHAGE-XR) 500 MG 24 hr tablet   Oral   Take 500 mg by mouth daily with breakfast.         . ondansetron (ZOFRAN) 4 MG tablet      Take 1-2 tabs by mouth every 8 hours as needed for nausea/vomiting   30 tablet   0   . pantoprazole (PROTONIX) 40 MG tablet   Oral   Take 40 mg by mouth daily.         . rosuvastatin (CRESTOR) 10 MG tablet   Oral   Take 10 mg by mouth daily.         Marland Kitchen venlafaxine (EFFEXOR-XR) 37.5 MG 24 hr capsule   Oral   Take 37.5 mg by mouth daily.           Allergies Review of patient's allergies  indicates no known allergies.  Family History  Problem Relation Age of Onset  . Liver cancer Father   . Lung cancer Father   . Diabetes Father   . Stroke Other   . Breast cancer Maternal Aunt 32  . Breast cancer Paternal Aunt 52    Social History Social History  Substance Use Topics  . Smoking status: Never Smoker   . Smokeless tobacco: Not on file     Comment: tobacco use- no  . Alcohol Use: Yes     Comment: occassional wine    Review of Systems Constitutional: No fever/chills Eyes: No visual changes. ENT: No sore throat.  Pain in left side of neck radiating into LUE Cardiovascular: +chest discomfort. Respiratory: +SOB. Gastrointestinal: No abdominal pain.  Severe nausea, no vomiting.  No diarrhea.  No constipation. Genitourinary: Negative for dysuria. Musculoskeletal: Negative for back pain.  Pain in left shoulder, left side of neck, and down left arm Skin: Negative for rash. Neurological: Tingling in left upper extremity, otherwise negative  10-point ROS otherwise negative.  ____________________________________________   PHYSICAL EXAM:  VITAL SIGNS: ED Triage Vitals  Enc Vitals Group     BP 10/27/15 0422 143/78 mmHg     Pulse Rate 10/27/15 0422 77     Resp 10/27/15 0422 20     Temp 10/27/15 0422 97.6 F (36.4 C)     Temp Source 10/27/15 0422 Oral     SpO2 --      Weight 10/27/15 0422 191 lb (86.637 kg)     Height 10/27/15 0422 5\' 7"  (1.702 m)     Head Cir --      Peak Flow --      Pain Score 10/27/15 0418 9     Pain Loc --      Pain Edu? --      Excl. in [place]? --     Constitutional: Alert and oriented. Nontoxic but appears very uncomfortable Eyes: Conjunctivae are normal. PERRL. EOMI. Head: Atraumatic. Nose: No congestion/rhinnorhea. Mouth/Throat: Mucous membranes are moist.  Oropharynx non-erythematous. Neck: No stridor.  No meningeal signs.  No cervical spine tenderness to palpation.  No hematomas and no bruits.  Tenderness to palpation of the left  side of her neck. Cardiovascular: Normal rate, regular rhythm. Good peripheral circulation. Grossly normal heart sounds.   Respiratory: Normal respiratory effort.  No retractions. Lungs CTAB. Gastrointestinal: Soft and nontender. No distention.  Musculoskeletal: No lower  extremity tenderness nor edema. No gross deformities of extremities.  Tenderness to palpation of the left shoulder including the scapular region and the deltoid.  Reproducible tenderness with abduction of the left arm/shoulder.  No distal tenderness in the left upper extremity. Neurologic:  Normal speech and language. No gross focal neurologic deficits are appreciated.  Skin:  Skin is warm, dry and intact. No rash noted.   ____________________________________________   LABS (all labs ordered are listed, but only abnormal results are displayed)  Labs Reviewed  CBC WITH DIFFERENTIAL/PLATELET - Abnormal; Notable for the following:    Lymphs Abs 0.8 (*)    All other components within normal limits  COMPREHENSIVE METABOLIC PANEL - Abnormal; Notable for the following:    Glucose, Bld 162 (*)    All other components within normal limits  FIBRIN DERIVATIVES D-DIMER ([facility] ONLY)  TROPONIN I   ____________________________________________  EKG  ED ECG REPORT I, Kielee Care, the attending physician, personally viewed and interpreted this ECG.  Date: 10/27/2015 EKG Time: 04:21 Rate: 72 Rhythm: normal sinus rhythm QRS Axis: normal Intervals: normal ST/T Wave abnormalities: normal Conduction Disturbances: none Narrative Interpretation: unremarkable  ____________________________________________  RADIOLOGY   No results found.  ____________________________________________   PROCEDURES  Procedure(s) performed: None  Critical Care performed: No ____________________________________________   INITIAL IMPRESSION / ASSESSMENT AND PLAN / ED COURSE  Pertinent labs & imaging results that were available during my  care of the patient were reviewed by me and considered in my medical decision making (see chart for details).  Although there are some elements of the patient's physical exam that strongly suggests musculoskeletal etiology such as the reproducible tenderness and tenderness with range of motion of the left arm, I am concerned about the gradual worsening of the symptoms and the fact that it starts in her neck and radiates down, now with neurological symptoms.  I feel it is appropriate to evaluate her neck for possible dissection.  Additionally, she is presenting with acute onset of severe nausea and some mild shortness of breath that feels just like it did in the past when she had pulmonary emboli.  I will check a d-dimer because she remains low risk (Wells Score for PE 2-level = 1.5) and if we can rule her out with a d-dimer that is within normal limits we can avoid an unnecessary CT scan of her chest.  However I will likely proceed with a CT angiogram of her neck, but we need to ensure normal kidney function prior to that assessment.  Currently I am treating her nausea and her pain with Zofran and morphine and will reassess.  ----------------------------------------- 6:09 AM on 10/27/2015 -----------------------------------------  Labs including d-dimer are WNL.  PE effectively ruled out.  Patient feels better after Zofran and Morphine, still w/ some residual tingling in L hand.  I suspect cervical radiculopathy, but will proceed with CT angio of neck given the initial concerning presentation and constellation of symptoms.  ----------------------------------------- 7:03 AM on 10/27/2015 -----------------------------------------  I am transferring emergency department care to Dr. Cinda Quest to follow-up the patient's CT scan.  I have prepared discharge paperwork assuming that the diagnosis will be she will skeletal pain and cervical radiculopathy, but he will modify as needed based on the CT scan  findings. ____________________________________________  FINAL CLINICAL IMPRESSION(S) / ED DIAGNOSES  Final diagnoses:  Cervical radiculopathy  Left shoulder pain      NEW MEDICATIONS STARTED DURING THIS VISIT:  New Prescriptions   DOCUSATE SODIUM (COLACE) 100 MG CAPSULE  Take 1 tablet once or twice daily as needed for constipation while taking narcotic pain medicine   HYDROCODONE-ACETAMINOPHEN (NORCO/VICODIN) 5-325 MG TABLET    Take 1-2 tablets by mouth every 4 (four) hours as needed for moderate pain.   ONDANSETRON (ZOFRAN) 4 MG TABLET    Take 1-2 tabs by mouth every 8 hours as needed for nausea/vomiting      Note:  This document was prepared using Dragon voice recognition software and may include unintentional dictation errors.   Hinda Kehr, MD 10/27/15 [phone]

## 2015-10-27 NOTE — ED Notes (Signed)
Patient ambulatory to triage with steady gait, without difficulty or distress noted ; pt reports last few days having left side neck/shoulder pain with movement; st pain worse tonight accomp by nausea and left hand tingling; denies hx of same

## 2015-11-10 ENCOUNTER — Encounter
Admission: RE | Disposition: A | Discharge status: Home / Self Care | Payer: Self-pay | Source: Ambulatory Visit | Attending: Gastroenterology

## 2015-11-10 ENCOUNTER — Ambulatory Visit
Admission: RE | Admit: 2015-11-10 | Discharge: 2015-11-10 | Disposition: A | Discharge status: Home / Self Care | Payer: BC Managed Care – PPO | Source: Ambulatory Visit | Attending: Gastroenterology | Admitting: Gastroenterology

## 2015-11-10 ENCOUNTER — Ambulatory Visit: Payer: BC Managed Care – PPO | Admitting: Anesthesiology

## 2015-11-10 DIAGNOSIS — Z8249 Family history of ischemic heart disease and other diseases of the circulatory system: Secondary | ICD-10-CM | POA: Insufficient documentation

## 2015-11-10 DIAGNOSIS — Z86711 Personal history of pulmonary embolism: Secondary | ICD-10-CM | POA: Insufficient documentation

## 2015-11-10 DIAGNOSIS — Z823 Family history of stroke: Secondary | ICD-10-CM | POA: Diagnosis not present

## 2015-11-10 DIAGNOSIS — Z79899 Other long term (current) drug therapy: Secondary | ICD-10-CM | POA: Insufficient documentation

## 2015-11-10 DIAGNOSIS — Z7984 Long term (current) use of oral hypoglycemic drugs: Secondary | ICD-10-CM | POA: Diagnosis not present

## 2015-11-10 DIAGNOSIS — K210 Gastro-esophageal reflux disease with esophagitis: Secondary | ICD-10-CM | POA: Insufficient documentation

## 2015-11-10 DIAGNOSIS — E785 Hyperlipidemia, unspecified: Secondary | ICD-10-CM | POA: Diagnosis not present

## 2015-11-10 DIAGNOSIS — E119 Type 2 diabetes mellitus without complications: Secondary | ICD-10-CM | POA: Diagnosis not present

## 2015-11-10 DIAGNOSIS — G43909 Migraine, unspecified, not intractable, without status migrainosus: Secondary | ICD-10-CM | POA: Insufficient documentation

## 2015-11-10 DIAGNOSIS — K222 Esophageal obstruction: Secondary | ICD-10-CM | POA: Diagnosis not present

## 2015-11-10 DIAGNOSIS — Z801 Family history of malignant neoplasm of trachea, bronchus and lung: Secondary | ICD-10-CM | POA: Insufficient documentation

## 2015-11-10 DIAGNOSIS — I10 Essential (primary) hypertension: Secondary | ICD-10-CM | POA: Diagnosis not present

## 2015-11-10 DIAGNOSIS — Z833 Family history of diabetes mellitus: Secondary | ICD-10-CM | POA: Diagnosis not present

## 2015-11-10 DIAGNOSIS — Z811 Family history of alcohol abuse and dependence: Secondary | ICD-10-CM | POA: Insufficient documentation

## 2015-11-10 DIAGNOSIS — Z85850 Personal history of malignant neoplasm of thyroid: Secondary | ICD-10-CM | POA: Diagnosis not present

## 2015-11-10 DIAGNOSIS — R1310 Dysphagia, unspecified: Secondary | ICD-10-CM | POA: Diagnosis not present

## 2015-11-10 DIAGNOSIS — K5730 Diverticulosis of large intestine without perforation or abscess without bleeding: Secondary | ICD-10-CM | POA: Diagnosis not present

## 2015-11-10 DIAGNOSIS — Z1211 Encounter for screening for malignant neoplasm of colon: Secondary | ICD-10-CM | POA: Diagnosis present

## 2015-11-10 DIAGNOSIS — Z8349 Family history of other endocrine, nutritional and metabolic diseases: Secondary | ICD-10-CM | POA: Diagnosis not present

## 2015-11-10 DIAGNOSIS — Z800 Family history of malignant neoplasm of digestive organs: Secondary | ICD-10-CM | POA: Insufficient documentation

## 2015-11-10 HISTORY — DX: Anesthesia of skin: R20.0

## 2015-11-10 HISTORY — DX: Headache: R51

## 2015-11-10 HISTORY — PX: ESOPHAGOGASTRODUODENOSCOPY: SHX5428

## 2015-11-10 HISTORY — DX: Localized swelling, mass and lump, lower limb, bilateral: R22.43

## 2015-11-10 HISTORY — DX: Hypothyroidism, unspecified: E03.9

## 2015-11-10 HISTORY — DX: Cervicalgia: M54.2

## 2015-11-10 HISTORY — DX: Headache, unspecified: R51.9

## 2015-11-10 HISTORY — DX: Paresthesia of skin: R20.2

## 2015-11-10 HISTORY — PX: COLONOSCOPY: SHX5424

## 2015-11-10 LAB — GLUCOSE, CAPILLARY: Glucose-Capillary: 146 mg/dL — ABNORMAL HIGH (ref 65–99)

## 2015-11-10 SURGERY — COLONOSCOPY
Anesthesia: Monitor Anesthesia Care | Wound class: Contaminated

## 2015-11-10 MED ORDER — LIDOCAINE HCL (CARDIAC) 20 MG/ML IV SOLN
INTRAVENOUS | Status: DC | PRN
  Administered 2015-11-10: 40 mg via INTRAVENOUS

## 2015-11-10 MED ORDER — LACTATED RINGERS IV SOLN
INTRAVENOUS | Status: DC
  Administered 2015-11-10: 11:00:00 via INTRAVENOUS

## 2015-11-10 MED ORDER — SODIUM CHLORIDE 0.9 % IV SOLN: INTRAVENOUS | Status: DC

## 2015-11-10 MED ORDER — STERILE WATER FOR IRRIGATION IR SOLN
Status: DC | PRN
  Administered 2015-11-10: 11:00:00

## 2015-11-10 MED ORDER — PROPOFOL 10 MG/ML IV BOLUS
INTRAVENOUS | Status: DC | PRN
Start: 2015-11-10 — End: 2015-11-10
  Administered 2015-11-10 (×3): 20 mg via INTRAVENOUS
  Administered 2015-11-10: 70 mg via INTRAVENOUS
  Administered 2015-11-10 (×2): 30 mg via INTRAVENOUS
  Administered 2015-11-10 (×2): 20 mg via INTRAVENOUS
  Administered 2015-11-10: 10 mg via INTRAVENOUS

## 2015-11-10 MED ORDER — GLYCOPYRROLATE 0.2 MG/ML IJ SOLN
INTRAMUSCULAR | Status: DC | PRN
  Administered 2015-11-10: 0.2 mg via INTRAVENOUS

## 2015-11-10 SURGICAL SUPPLY — 41 items
BALLN DILATOR 10-12 8 (BALLOONS)
BALLN DILATOR 12-15 8 (BALLOONS)
BALLN DILATOR 15-18 8 (BALLOONS)
BALLN DILATOR CRE 0-12 8 (BALLOONS)
BALLN DILATOR ESOPH 8 10 CRE (MISCELLANEOUS) IMPLANT
BALLOON DILATOR 12-15 8 (BALLOONS) IMPLANT
BALLOON DILATOR 15-18 8 (BALLOONS) IMPLANT
BALLOON DILATOR CRE 0-12 8 (BALLOONS) IMPLANT
BLOCK BITE 60FR ADLT L/F GRN (MISCELLANEOUS) ×3 IMPLANT
CANISTER SUCT 1200ML W/VALVE (MISCELLANEOUS) ×3 IMPLANT
FCP ESCP3.2XJMB 240X2.8X (MISCELLANEOUS)
FORCEPS BIOP RAD 4 LRG CAP 4 (CUTTING FORCEPS) IMPLANT
FORCEPS BIOP RJ4 240 W/NDL (MISCELLANEOUS)
FORCEPS ESCP3.2XJMB 240X2.8X (MISCELLANEOUS) IMPLANT
GOWN CVR UNV OPN BCK APRN NK (MISCELLANEOUS) ×1 IMPLANT
GOWN ISOL THUMB LOOP REG UNIV (MISCELLANEOUS) ×2
GOWN STRL REUS W/ TWL LRG LVL3 (GOWN DISPOSABLE) ×1 IMPLANT
GOWN STRL REUS W/TWL LRG LVL3 (GOWN DISPOSABLE) ×2
HEMOCLIP INSTINCT (CLIP) IMPLANT
INJECTOR VARIJECT VIN23 (MISCELLANEOUS) IMPLANT
KIT CO2 TUBING (TUBING) IMPLANT
KIT DEFENDO VALVE AND CONN (KITS) IMPLANT
KIT ENDO PROCEDURE OLY (KITS) ×3 IMPLANT
LIGATOR MULTIBAND 6SHOOTER MBL (MISCELLANEOUS) IMPLANT
MARKER SPOT ENDO TATTOO 5ML (MISCELLANEOUS) IMPLANT
PAD GROUND ADULT SPLIT (MISCELLANEOUS) IMPLANT
SNARE SHORT THROW 13M SML OVAL (MISCELLANEOUS) IMPLANT
SNARE SHORT THROW 30M LRG OVAL (MISCELLANEOUS) IMPLANT
SPOT EX ENDOSCOPIC TATTOO (MISCELLANEOUS)
SUCTION POLY TRAP 4CHAMBER (MISCELLANEOUS) IMPLANT
SYR INFLATION 60ML (SYRINGE) IMPLANT
TRAP SUCTION POLY (MISCELLANEOUS) IMPLANT
TUBING CONN 6MMX3.1M (TUBING)
TUBING SUCTION CONN 0.25 STRL (TUBING) IMPLANT
UNDERPAD 30X60 958B10 (PK) (MISCELLANEOUS) IMPLANT
VALVE BIOPSY ENDO (VALVE) IMPLANT
VARIJECT INJECTOR VIN23 (MISCELLANEOUS)
WATER AUXILLARY (MISCELLANEOUS) IMPLANT
WATER STERILE IRR 250ML POUR (IV SOLUTION) ×3 IMPLANT
WATER STERILE IRR 500ML POUR (IV SOLUTION) IMPLANT
WIRE CRE 18-20MM 8CM F G (MISCELLANEOUS) IMPLANT

## 2015-11-10 NOTE — Anesthesia Preprocedure Evaluation (Signed)
Anesthesia Evaluation  Patient identified by MRN, date of birth, ID band Patient awake    Reviewed: Allergy & Precautions, H&P , NPO status , Patient's Chart, lab work & pertinent test results, reviewed documented beta blocker date and time   Airway Mallampati: II  TM Distance: >3 FB Neck ROM: full    Dental no notable dental hx.    Pulmonary neg pulmonary ROS,    Pulmonary exam normal breath sounds clear to auscultation       Cardiovascular Exercise Tolerance: Good hypertension,  Rhythm:regular Rate:Normal     Neuro/Psych  Headaches, negative psych ROS   GI/Hepatic Neg liver ROS, GERD  ,  Endo/Other  diabetesHypothyroidism   Renal/GU negative Renal ROS  negative genitourinary   Musculoskeletal   Abdominal   Peds  Hematology negative hematology ROS (+)   Anesthesia Other Findings   Reproductive/Obstetrics negative OB ROS                             Anesthesia Physical Anesthesia Plan  ASA: II  Anesthesia Plan: MAC   Post-op Pain Management:    Induction:   Airway Management Planned:   Additional Equipment:   Intra-op Plan:   Post-operative Plan:   Informed Consent: I have reviewed the patients History and Physical, chart, labs and discussed the procedure including the risks, benefits and alternatives for the proposed anesthesia with the patient or authorized representative who has indicated his/her understanding and acceptance.   Dental Advisory Given  Plan Discussed with: CRNA  Anesthesia Plan Comments:         Anesthesia Quick Evaluation

## 2015-11-10 NOTE — H&P (Signed)
  Date of Initial H&P: 10/19/2015  History reviewed, patient examined, no change in status, stable for surgery. 

## 2015-11-10 NOTE — Transfer of Care (Signed)
Immediate Anesthesia Transfer of Care Note  Patient: Alisha Sanchez  Procedure(s) Performed: Procedure(s): COLONOSCOPY (N/A) ESOPHAGOGASTRODUODENOSCOPY (EGD) with dialation (N/A)  Patient Location: PACU  Anesthesia Type: MAC  Level of Consciousness: awake, alert  and patient cooperative  Airway and Oxygen Therapy: Patient Spontanous Breathing and Patient connected to supplemental oxygen  Post-op Assessment: Post-op Vital signs reviewed, Patient's Cardiovascular Status Stable, Respiratory Function Stable, Patent Airway and No signs of Nausea or vomiting  Post-op Vital Signs: Reviewed and stable  Complications: No apparent anesthesia complications

## 2015-11-10 NOTE — Anesthesia Procedure Notes (Signed)
Procedure Name: MAC Performed by: Hamdan Toscano Pre-anesthesia Checklist: Patient identified, Emergency Drugs available, Suction available, Timeout performed and Patient being monitored Patient Re-evaluated:Patient Re-evaluated prior to inductionOxygen Delivery Method: Nasal cannula Placement Confirmation: positive ETCO2       

## 2015-11-10 NOTE — Anesthesia Postprocedure Evaluation (Signed)
Anesthesia Post Note  Patient: Alisha Sanchez  Procedure(s) Performed: Procedure(s) (LRB): COLONOSCOPY (N/A) ESOPHAGOGASTRODUODENOSCOPY (EGD) with dialation (N/A)  Patient location during evaluation: PACU Anesthesia Type: MAC Level of consciousness: awake and alert Pain management: pain level controlled Vital Signs Assessment: post-procedure vital signs reviewed and stable Respiratory status: spontaneous breathing, nonlabored ventilation, respiratory function stable and patient connected to nasal cannula oxygen Cardiovascular status: blood pressure returned to baseline and stable Postop Assessment: no signs of nausea or vomiting Anesthetic complications: no    Alisa Graff

## 2015-11-10 NOTE — Discharge Instructions (Signed)

## 2015-11-10 NOTE — Op Note (Signed)
[facility] Gastroenterology Patient Name: Alisha Sanchez Procedure Date: 11/10/2015 11:04 AM MRN: [id number] Account #: [id number] Date of Birth: 1956-04-21 Admit Type: Outpatient Age: 60 Room: [facility] OR ROOM 01 Gender: Female Note Status: Finalized Procedure:            Colonoscopy Indications:          Screening for colorectal malignant neoplasm Providers:            Lupita Dawn. Candace Cruise, MD Medicines:            Monitored Anesthesia Care Complications:        No immediate complications. Procedure:            Pre-Anesthesia Assessment:                       - Prior to the procedure, a History and Physical was                        performed, and patient medications, allergies and                        sensitivities were reviewed. The patient's tolerance of                        previous anesthesia was reviewed.                       - The risks and benefits of the procedure and the                        sedation options and risks were discussed with the                        patient. All questions were answered and informed                        consent was obtained.                       - After reviewing the risks and benefits, the patient                        was deemed in satisfactory condition to undergo the                        procedure.                       After obtaining informed consent, the colonoscope was                        passed under direct vision. Throughout the procedure,                        the patient's blood pressure, pulse, and oxygen                        saturations were monitored continuously. The Olympus CF                        H180AL colonoscope (S#: [id number]) was introduced through  the anus and advanced to the the cecum, identified by                        appendiceal orifice and ileocecal valve. The                        colonoscopy was performed without difficulty. The   patient tolerated the procedure well. The quality of                        the bowel preparation was fair. Findings:      Multiple small and large-mouthed diverticula were found in the sigmoid       colon.      The exam was otherwise without abnormality. Impression:           - Preparation of the colon was fair.                       - Diverticulosis in the sigmoid colon.                       - The examination was otherwise normal.                       - No specimens collected. Recommendation:       - Discharge patient to home.                       - Repeat colonoscopy in 10 years for surveillance.                       - The findings and recommendations were discussed with                        the patient's family. Procedure Code(s):    --- Professional ---                       [phone], Colonoscopy, flexible; diagnostic, including                        collection of specimen(s) by brushing or washing, when                        performed (separate procedure) Diagnosis Code(s):    --- Professional ---                       Z12.11, Encounter for screening for malignant neoplasm                        of colon                       K57.30, Diverticulosis of large intestine without                        perforation or abscess without bleeding CPT copyright 2016 [employer]. All rights reserved. The codes documented in this report are preliminary and upon coder review may  be revised to meet current compliance requirements. Hulen Luster, MD 11/10/2015 11:30:22 AM This report has been signed electronically. Number of Addenda: 0 Note Initiated On: 11/10/2015 11:04  AM Scope Withdrawal Time: 0 hours 5 minutes 27 seconds  Total Procedure Duration: 0 hours 9 minutes 16 seconds       [facility]

## 2015-11-10 NOTE — Op Note (Signed)
[facility] Gastroenterology Patient Name: Alisha Sanchez Procedure Date: 11/10/2015 11:04 AM MRN: [id number] Account #: [id number] Date of Birth: October 15, 1955 Admit Type: Outpatient Age: 59 Room: [facility] OR ROOM 01 Gender: Female Note Status: Finalized Procedure:            Upper GI endoscopy Indications:          Dysphagia Providers:            Lupita Dawn. Candace Cruise, MD Referring MD:         Irven Easterly. Kary Kos, MD (Referring MD) Medicines:            Monitored Anesthesia Care Complications:        No immediate complications. Procedure:            Pre-Anesthesia Assessment:                       - Prior to the procedure, a History and Physical was                        performed, and patient medications, allergies and                        sensitivities were reviewed. The patient's tolerance of                        previous anesthesia was reviewed.                       - The risks and benefits of the procedure and the                        sedation options and risks were discussed with the                        patient. All questions were answered and informed                        consent was obtained.                       - After reviewing the risks and benefits, the patient                        was deemed in satisfactory condition to undergo the                        procedure.                       After obtaining informed consent, the endoscope was                        passed under direct vision. Throughout the procedure,                        the patient's blood pressure, pulse, and oxygen                        saturations were monitored continuously. The Olympus  GIF H180J colonscope ([id number]) was introduced                        through the mouth, and advanced to the second part of                        duodenum. The upper GI endoscopy was accomplished                        without difficulty. The patient tolerated the  procedure                        well. Findings:      One mild benign-appearing, intrinsic stenosis was found at the       gastroesophageal junction. And was traversed. The scope was withdrawn.       Dilation was performed with a Maloney dilator with mild resistance at 70       Fr.      LA Grade A (one or more mucosal breaks less than 5 mm, not extending       between tops of 2 mucosal folds) esophagitis was found at the       gastroesophageal junction.      The exam was otherwise without abnormality.      The entire examined stomach was normal.      The examined duodenum was normal. Impression:           - Benign-appearing esophageal stenosis. Dilated.                       - LA Grade A reflux esophagitis.                       - The examination was otherwise normal.                       - Normal stomach.                       - Normal examined duodenum.                       - No specimens collected. Recommendation:       - Discharge patient to home.                       - Observe patient's clinical course.                       - Continue present medications.                       - The findings and recommendations were discussed with                        the patient. Procedure Code(s):    --- Professional ---                       [phone], Esophagogastroduodenoscopy, flexible, transoral;                        diagnostic, including collection of specimen(s) by  brushing or washing, when performed (separate procedure)                       [id number], Dilation of esophagus, by unguided sound or                        bougie, single or multiple passes Diagnosis Code(s):    --- Professional ---                       K22.2, Esophageal obstruction                       K21.0, Gastro-esophageal reflux disease with esophagitis                       R13.10, Dysphagia, unspecified CPT copyright 2016 American [employer] Association. All rights reserved. The codes  documented in this report are preliminary and upon coder review may  be revised to meet current compliance requirements. Hulen Luster, MD 11/10/2015 11:18:13 AM This report has been signed electronically. Number of Addenda: 0 Note Initiated On: 11/10/2015 11:04 AM Total Procedure Duration: 0 hours 2 minutes 14 seconds       [facility]

## 2015-11-11 ENCOUNTER — Encounter: Payer: Self-pay | Admitting: Gastroenterology

## 2016-11-03 ENCOUNTER — Other Ambulatory Visit: Payer: Self-pay | Admitting: Family Medicine

## 2016-11-03 DIAGNOSIS — Z1231 Encounter for screening mammogram for malignant neoplasm of breast: Secondary | ICD-10-CM

## 2016-11-08 ENCOUNTER — Ambulatory Visit
Admission: RE | Admit: 2016-11-08 | Discharge: 2016-11-08 | Disposition: A | Discharge status: Home / Self Care | Payer: BC Managed Care – PPO | Source: Ambulatory Visit | Attending: Family Medicine | Admitting: Family Medicine

## 2016-11-08 DIAGNOSIS — Z1231 Encounter for screening mammogram for malignant neoplasm of breast: Secondary | ICD-10-CM | POA: Diagnosis present

## 2017-08-21 ENCOUNTER — Other Ambulatory Visit: Payer: Self-pay

## 2017-08-21 ENCOUNTER — Encounter: Payer: Self-pay | Admitting: *Deleted

## 2017-08-24 NOTE — Discharge Instructions (Signed)

## 2017-08-27 ENCOUNTER — Ambulatory Visit: Payer: BC Managed Care – PPO | Admitting: Anesthesiology

## 2017-08-27 ENCOUNTER — Ambulatory Visit
Admission: RE | Admit: 2017-08-27 | Discharge: 2017-08-27 | Disposition: A | Discharge status: Home / Self Care | Payer: BC Managed Care – PPO | Source: Ambulatory Visit | Attending: Ophthalmology | Admitting: Ophthalmology

## 2017-08-27 ENCOUNTER — Encounter
Admission: RE | Disposition: A | Discharge status: Home / Self Care | Payer: Self-pay | Source: Ambulatory Visit | Attending: Ophthalmology

## 2017-08-27 DIAGNOSIS — I10 Essential (primary) hypertension: Secondary | ICD-10-CM | POA: Diagnosis not present

## 2017-08-27 DIAGNOSIS — Z7984 Long term (current) use of oral hypoglycemic drugs: Secondary | ICD-10-CM | POA: Insufficient documentation

## 2017-08-27 DIAGNOSIS — E039 Hypothyroidism, unspecified: Secondary | ICD-10-CM | POA: Insufficient documentation

## 2017-08-27 DIAGNOSIS — Z86711 Personal history of pulmonary embolism: Secondary | ICD-10-CM | POA: Diagnosis not present

## 2017-08-27 DIAGNOSIS — Z79899 Other long term (current) drug therapy: Secondary | ICD-10-CM | POA: Diagnosis not present

## 2017-08-27 DIAGNOSIS — H2512 Age-related nuclear cataract, left eye: Secondary | ICD-10-CM | POA: Insufficient documentation

## 2017-08-27 DIAGNOSIS — K219 Gastro-esophageal reflux disease without esophagitis: Secondary | ICD-10-CM | POA: Diagnosis not present

## 2017-08-27 DIAGNOSIS — E7800 Pure hypercholesterolemia, unspecified: Secondary | ICD-10-CM | POA: Diagnosis not present

## 2017-08-27 DIAGNOSIS — E119 Type 2 diabetes mellitus without complications: Secondary | ICD-10-CM | POA: Diagnosis not present

## 2017-08-27 HISTORY — DX: Dizziness and giddiness: R42

## 2017-08-27 HISTORY — PX: CATARACT EXTRACTION W/PHACO: SHX586

## 2017-08-27 HISTORY — DX: Unspecified osteoarthritis, unspecified site: M19.90

## 2017-08-27 LAB — GLUCOSE, CAPILLARY
GLUCOSE-CAPILLARY: 213 mg/dL — AB (ref 65–99)
GLUCOSE-CAPILLARY: 228 mg/dL — AB (ref 65–99)

## 2017-08-27 SURGERY — PHACOEMULSIFICATION, CATARACT, WITH IOL INSERTION
Anesthesia: Monitor Anesthesia Care | Site: Eye | Laterality: Left | Wound class: Clean

## 2017-08-27 MED ORDER — ARMC OPHTHALMIC DILATING DROPS
1.0000 "application " | OPHTHALMIC | Status: DC | PRN
  Administered 2017-08-27 (×3): 1 via OPHTHALMIC

## 2017-08-27 MED ORDER — NA HYALUR & NA CHOND-NA HYALUR 0.4-0.35 ML IO KIT
PACK | INTRAOCULAR | Status: DC | PRN
  Administered 2017-08-27: 1 mL via INTRAOCULAR

## 2017-08-27 MED ORDER — MIDAZOLAM HCL 2 MG/2ML IJ SOLN
INTRAMUSCULAR | Status: DC | PRN
  Administered 2017-08-27 (×2): 1 mg via INTRAVENOUS

## 2017-08-27 MED ORDER — FENTANYL CITRATE (PF) 100 MCG/2ML IJ SOLN
INTRAMUSCULAR | Status: DC | PRN
  Administered 2017-08-27: 50 ug via INTRAVENOUS

## 2017-08-27 MED ORDER — BRIMONIDINE TARTRATE-TIMOLOL 0.2-0.5 % OP SOLN
OPHTHALMIC | Status: DC | PRN
  Administered 2017-08-27: 1 [drp] via OPHTHALMIC

## 2017-08-27 MED ORDER — LIDOCAINE HCL (PF) 2 % IJ SOLN
INTRAOCULAR | Status: DC | PRN
  Administered 2017-08-27: 1 mL via INTRAMUSCULAR

## 2017-08-27 MED ORDER — MOXIFLOXACIN HCL 0.5 % OP SOLN
1.0000 [drp] | OPHTHALMIC | Status: DC | PRN
  Administered 2017-08-27 (×3): 1 [drp] via OPHTHALMIC

## 2017-08-27 MED ORDER — CEFUROXIME OPHTHALMIC INJECTION 1 MG/0.1 ML
INJECTION | OPHTHALMIC | Status: DC | PRN
  Administered 2017-08-27: 0.1 mL via INTRACAMERAL

## 2017-08-27 MED ORDER — EPINEPHRINE PF 1 MG/ML IJ SOLN
INTRAOCULAR | Status: DC | PRN
  Administered 2017-08-27: 66 mL via OPHTHALMIC

## 2017-08-27 SURGICAL SUPPLY — 19 items
CANNULA ANT/CHMB 27GA (MISCELLANEOUS) ×3 IMPLANT
GLOVE SURG LX 7.5 STRW (GLOVE) ×2
GLOVE SURG LX STRL 7.5 STRW (GLOVE) ×1 IMPLANT
GLOVE SURG TRIUMPH 8.0 PF LTX (GLOVE) ×3 IMPLANT
GOWN STRL REUS W/ TWL LRG LVL3 (GOWN DISPOSABLE) ×2 IMPLANT
GOWN STRL REUS W/TWL LRG LVL3 (GOWN DISPOSABLE) ×4
LENS IOL ACRSF IQ ULTRA 15.5 (Intraocular Lens) ×1 IMPLANT
LENS IOL ACRYSOF IQ 15.5 (Intraocular Lens) ×3 IMPLANT
MARKER SKIN DUAL TIP RULER LAB (MISCELLANEOUS) ×3 IMPLANT
NEEDLE FILTER BLUNT 18X 1/2SAF (NEEDLE) ×2
NEEDLE FILTER BLUNT 18X1 1/2 (NEEDLE) ×1 IMPLANT
PACK CATARACT BRASINGTON (MISCELLANEOUS) ×3 IMPLANT
PACK EYE AFTER SURG (MISCELLANEOUS) ×3 IMPLANT
PACK OPTHALMIC (MISCELLANEOUS) ×3 IMPLANT
SYR 3ML LL SCALE MARK (SYRINGE) ×3 IMPLANT
SYR 5ML LL (SYRINGE) ×3 IMPLANT
SYR TB 1ML LUER SLIP (SYRINGE) ×3 IMPLANT
WATER STERILE IRR 500ML POUR (IV SOLUTION) ×3 IMPLANT
WIPE NON LINTING 3.25X3.25 (MISCELLANEOUS) ×3 IMPLANT

## 2017-08-27 NOTE — Transfer of Care (Signed)
Immediate Anesthesia Transfer of Care Note  Patient: Alisha Sanchez  Procedure(s) Performed: CATARACT EXTRACTION PHACO AND INTRAOCULAR LENS PLACEMENT (IOC) LEFT DIABETIC (Left Eye)  Patient Location: PACU  Anesthesia Type: MAC  Level of Consciousness: awake, alert  and patient cooperative  Airway and Oxygen Therapy: Patient Spontanous Breathing and Patient connected to supplemental oxygen  Post-op Assessment: Post-op Vital signs reviewed, Patient's Cardiovascular Status Stable, Respiratory Function Stable, Patent Airway and No signs of Nausea or vomiting  Post-op Vital Signs: Reviewed and stable  Complications: No apparent anesthesia complications

## 2017-08-27 NOTE — Op Note (Signed)
OPERATIVE NOTE  Alisha Sanchez [id number] 08/27/2017   PREOPERATIVE DIAGNOSIS:  Nuclear sclerotic cataract left eye. H25.12   POSTOPERATIVE DIAGNOSIS:    Nuclear sclerotic cataract left eye.     PROCEDURE:  Phacoemusification with posterior chamber intraocular lens placement of the left eye   LENS:   Implant Name Type Inv. Item Serial No. Manufacturer Lot No. LRB No. Used  LENS IOL ACRYSOF IQ 15.5 - [id number] Intraocular Lens LENS IOL ACRYSOF IQ 15.5 [id number] ALCON  Left 1        ULTRASOUND TIME: 17  % of 1 minutes 26 seconds, CDE 14.5  SURGEON:  Wyonia Hough, MD   ANESTHESIA:  Topical with tetracaine drops and 2% Xylocaine jelly, augmented with 1% preservative-free intracameral lidocaine.    COMPLICATIONS:  None.   DESCRIPTION OF PROCEDURE:  The patient was identified in the holding room and transported to the operating room and placed in the supine position under the operating microscope.  The left eye was identified as the operative eye and it was prepped and draped in the usual sterile ophthalmic fashion.   A 1 millimeter clear-corneal paracentesis was made at the 1:30 position.  0.5 ml of preservative-free 1% lidocaine was injected into the anterior chamber.  The anterior chamber was filled with Viscoat viscoelastic.  A 2.4 millimeter keratome was used to make a near-clear corneal incision at the 10:30 position.  .  A curvilinear capsulorrhexis was made with a cystotome and capsulorrhexis forceps.  Balanced salt solution was used to hydrodissect and hydrodelineate the nucleus.   Phacoemulsification was then used in stop and chop fashion to remove the lens nucleus and epinucleus.  The remaining cortex was then removed using the irrigation and aspiration handpiece. Provisc was then placed into the capsular bag to distend it for lens placement.  A lens was then injected into the capsular bag.  The remaining viscoelastic was aspirated.   Wounds were hydrated  with balanced salt solution.  The anterior chamber was inflated to a physiologic pressure with balanced salt solution.  No wound leaks were noted. Cefuroxime 0.1 ml of a 10mg /ml solution was injected into the anterior chamber for a dose of 1 mg of intracameral antibiotic at the completion of the case.   Timolol and Brimonidine drops were applied to the eye.  The patient was taken to the recovery room in stable condition without complications of anesthesia or surgery.  [name] 08/27/2017, 8:06 AM

## 2017-08-27 NOTE — H&P (Signed)
The History and Physical notes are on paper, have been signed, and are to be scanned. The patient remains stable and unchanged from the H&P.   Previous H&P reviewed, patient examined, and there are no changes.  Alisha Sanchez 08/27/2017 7:42 AM

## 2017-08-27 NOTE — Anesthesia Preprocedure Evaluation (Signed)
Anesthesia Evaluation  Patient identified by MRN, date of birth, ID band Patient awake    Reviewed: Allergy & Precautions, H&P , NPO status , Patient's Chart, lab work & pertinent test results, reviewed documented beta blocker date and time   Airway Mallampati: II  TM Distance: >3 FB Neck ROM: full    Dental no notable dental hx.    Pulmonary neg pulmonary ROS,    Pulmonary exam normal breath sounds clear to auscultation       Cardiovascular Exercise Tolerance: Good hypertension,  Rhythm:regular Rate:Normal     Neuro/Psych  Headaches, negative psych ROS   GI/Hepatic Neg liver ROS, GERD  ,  Endo/Other  diabetesHypothyroidism   Renal/GU negative Renal ROS  negative genitourinary   Musculoskeletal   Abdominal   Peds  Hematology negative hematology ROS (+)   Anesthesia Other Findings   Reproductive/Obstetrics negative OB ROS                             Anesthesia Physical  Anesthesia Plan  ASA: II  Anesthesia Plan: MAC   Post-op Pain Management:    Induction:   PONV Risk Score and Plan:   Airway Management Planned:   Additional Equipment:   Intra-op Plan:   Post-operative Plan:   Informed Consent: I have reviewed the patients History and Physical, chart, labs and discussed the procedure including the risks, benefits and alternatives for the proposed anesthesia with the patient or authorized representative who has indicated his/her understanding and acceptance.   Dental Advisory Given  Plan Discussed with: CRNA  Anesthesia Plan Comments:         Anesthesia Quick Evaluation

## 2017-08-27 NOTE — Anesthesia Postprocedure Evaluation (Signed)
Anesthesia Post Note  Patient: Alisha Sanchez  Procedure(s) Performed: CATARACT EXTRACTION PHACO AND INTRAOCULAR LENS PLACEMENT (IOC) LEFT DIABETIC (Left Eye)  Patient location during evaluation: PACU Anesthesia Type: MAC Level of consciousness: awake and alert Pain management: pain level controlled Vital Signs Assessment: post-procedure vital signs reviewed and stable Respiratory status: spontaneous breathing, nonlabored ventilation, respiratory function stable and patient connected to nasal cannula oxygen Cardiovascular status: stable and blood pressure returned to baseline Postop Assessment: no apparent nausea or vomiting Anesthetic complications: no    Alisa Graff

## 2017-08-27 NOTE — Anesthesia Procedure Notes (Signed)
Procedure Name: MAC Date/Time: 08/27/2017 7:46 AM Performed by: Cameron Ali, CRNA Pre-anesthesia Checklist: Patient identified, Emergency Drugs available, Suction available, Timeout performed and Patient being monitored Patient Re-evaluated:Patient Re-evaluated prior to induction Oxygen Delivery Method: Nasal cannula Placement Confirmation: positive ETCO2

## 2018-01-08 ENCOUNTER — Encounter: Payer: Self-pay | Admitting: Student in an Organized Health Care Education/Training Program

## 2018-01-08 ENCOUNTER — Ambulatory Visit
Payer: BC Managed Care – PPO | Attending: Student in an Organized Health Care Education/Training Program | Admitting: Student in an Organized Health Care Education/Training Program

## 2018-01-08 ENCOUNTER — Other Ambulatory Visit: Payer: Self-pay

## 2018-01-08 VITALS — BP 140/80 | HR 78 | Temp 98.3°F | Resp 18 | Ht 66.0 in | Wt 196.0 lb

## 2018-01-08 DIAGNOSIS — Z6831 Body mass index (BMI) 31.0-31.9, adult: Secondary | ICD-10-CM | POA: Insufficient documentation

## 2018-01-08 DIAGNOSIS — E119 Type 2 diabetes mellitus without complications: Secondary | ICD-10-CM | POA: Insufficient documentation

## 2018-01-08 DIAGNOSIS — Z79890 Hormone replacement therapy (postmenopausal): Secondary | ICD-10-CM | POA: Insufficient documentation

## 2018-01-08 DIAGNOSIS — E039 Hypothyroidism, unspecified: Secondary | ICD-10-CM | POA: Diagnosis not present

## 2018-01-08 DIAGNOSIS — M25562 Pain in left knee: Secondary | ICD-10-CM | POA: Diagnosis not present

## 2018-01-08 DIAGNOSIS — Z86711 Personal history of pulmonary embolism: Secondary | ICD-10-CM | POA: Insufficient documentation

## 2018-01-08 DIAGNOSIS — I10 Essential (primary) hypertension: Secondary | ICD-10-CM | POA: Diagnosis not present

## 2018-01-08 DIAGNOSIS — M1712 Unilateral primary osteoarthritis, left knee: Secondary | ICD-10-CM | POA: Insufficient documentation

## 2018-01-08 DIAGNOSIS — E785 Hyperlipidemia, unspecified: Secondary | ICD-10-CM | POA: Diagnosis not present

## 2018-01-08 DIAGNOSIS — Z794 Long term (current) use of insulin: Secondary | ICD-10-CM | POA: Insufficient documentation

## 2018-01-08 DIAGNOSIS — K219 Gastro-esophageal reflux disease without esophagitis: Secondary | ICD-10-CM | POA: Diagnosis not present

## 2018-01-08 DIAGNOSIS — G8929 Other chronic pain: Secondary | ICD-10-CM | POA: Diagnosis not present

## 2018-01-08 DIAGNOSIS — Z79899 Other long term (current) drug therapy: Secondary | ICD-10-CM | POA: Insufficient documentation

## 2018-01-08 DIAGNOSIS — E669 Obesity, unspecified: Secondary | ICD-10-CM | POA: Diagnosis not present

## 2018-01-08 DIAGNOSIS — G894 Chronic pain syndrome: Secondary | ICD-10-CM | POA: Diagnosis not present

## 2018-01-08 NOTE — Progress Notes (Signed)
Safety precautions to be maintained throughout the outpatient stay will include: orient to surroundings, keep bed in low position, maintain call bell within reach at all times, provide assistance with transfer out of bed and ambulation.  

## 2018-01-08 NOTE — Progress Notes (Signed)
Patient's Name: Alisha Sanchez  MRN: [id number]  Referring Provider: Duanne Guess, [place]  DOB: [birth year]  PCP: [name], MD  DOS: 01/08/2018  Note by: Gillis Santa, MD  Service setting: Ambulatory outpatient  Specialty: Interventional Pain Management  Location: [facility] (AMB) Pain Management Facility  Visit type: Initial Patient Evaluation  Patient type: New Patient   Primary Reason(s) for Visit: Encounter for initial evaluation of one or more chronic problems (new to examiner) potentially causing chronic pain, and posing a threat to normal musculoskeletal function. (Level of risk: High) CC: Knee Pain (left)  HPI  Alisha Sanchez is a 62 y.o. year old, female patient, who comes today to see [place] for the first time for an initial evaluation of her chronic pain. 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 Today she comes in for evaluation of her Knee Pain (left)  Pain Assessment: Location:    Left knee Radiating:  No Onset: More than a month ago Duration:  Greater than 2 years  Quality: Larence Penning, Constant Severity: 8 /10 (subjective, self-reported pain score)  Note: Reported level is inconsistent with clinical observations. Clinically the patient looks like a 3/10 A 3/10 is viewed as "Moderate" and described as significantly interfering with activities of daily living (ADL). It becomes difficult to feed, bathe, get dressed, get on and off the toilet or to perform personal hygiene functions. Difficult to get in and out of bed or a chair without assistance. Very distracting. With effort, it can be ignored when deeply involved in activities.       When using our objective Pain Scale, levels between 6 and 10/10 are said to belong in an emergency room, as it progressively worsens from a 6/10, described as severely limiting, requiring  emergency care not usually available at an outpatient pain management facility. At a 6/10 level, communication becomes difficult and requires great effort. Assistance to reach the emergency department may be required. Facial flushing and profuse sweating along with potentially dangerous increases in heart rate and blood pressure will be evident. Effect on ADL:   Timing: Intermittent Modifying factors: rest, topicals, advil BP: 140/80  HR: 78  Onset and Duration: Gradual and Present longer than 3 months Cause of pain: Unknown Severity: Getting worse, NAS-11 at its worse: 9/10, NAS-11 at its best: 2/10, NAS-11 now: 5/10 and NAS-11 on the average: 8/10 Timing: Not influenced by the time of the day, During activity or exercise and After activity or exercise Aggravating Factors: Climbing, Kneeling, Lifiting, Motion, Prolonged sitting, Prolonged standing, Squatting, Stooping , Twisting, Walking, Walking uphill, Walking downhill and Working Alleviating Factors: Cold packs, Hot packs, Lying down, Medications and Using a brace Associated Problems: Night-time cramps, Nausea, Sweating, Tingling, Pain that wakes patient up and Pain that does not allow patient to sleep Quality of Pain: Aching, Agonizing, Constant, Disabling, Exhausting, Nagging, Sharp, Shooting, Stabbing, Tender, Throbbing, Tiring and Uncomfortable Previous Examinations or Tests: Bone scan, CT scan, MRI scan, X-rays and Orthopedic evaluation Previous Treatments: Trigger point injections  The patient comes into the clinics today for the first time for a chronic pain management evaluation.   62 year old female with a history of left knee osteoarthritis status post left knee intra-articular steroid injections as well as left knee Visco supplementation which did not result in any significant long-term benefit for her left knee pain.  Patient is tried various over-the-counter as  well as prescription strength medications including ibuprofen,  acetaminophen, Mobic without any significant improvement.  Patient is referred here for a diagnostic left genicular nerve block and possible radiofrequency ablation.  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  Today I took the time to provide the patient with information regarding my pain practice. The patient was informed that my practice is divided into two sections: an interventional pain management section, as well as a completely separate and distinct medication management section. I explained that I have procedure days for my interventional therapies, and evaluation days for follow-ups and medication management. Because of the amount of documentation required during both, they are kept separated. This means that there is the possibility that she may be scheduled for a procedure on one day, and medication management the next. I have also informed her that because of staffing and facility limitations, I no longer take patients for medication management only. To illustrate the reasons for this, I gave the patient the example of surgeons, and how inappropriate it would be to refer a patient to his/her care, just to write for the post-surgical antibiotics on a surgery done by a different surgeon.   Because interventional pain management is my board-certified specialty, the patient was informed that joining my practice means that they are open to any and all interventional therapies. I made it clear that this does not mean that they will be forced to have any procedures done. What this means is that I believe interventional therapies to be essential part of the diagnosis and proper management of chronic pain conditions. Therefore, patients not interested in these interventional alternatives will be better served under the care of a different practitioner.  The patient was also made aware of my Comprehensive Pain Management Safety Guidelines where  by joining my practice, they limit all of their nerve blocks and joint injections to those done by our practice, for as long as we are retained to manage their care.    Meds   Current Outpatient Medications:  .  alendronate (FOSAMAX) 70 MG tablet, Take 70 mg by mouth once a week. Take with a full glass of water on an empty stomach., Disp: , Rfl:  .  amLODipine (NORVASC) 5 MG tablet, Take 5 mg by mouth daily., Disp: , Rfl:  .  Cyanocobalamin (VITAMIN B-12 PO), Take by mouth daily., Disp: , Rfl:  .  hydrochlorothiazide (HYDRODIURIL) 25 MG tablet, Take 25 mg by mouth daily., Disp: , Rfl:  .  ibuprofen (ADVIL,MOTRIN) 200 MG tablet, Take 400 mg by mouth every 6 (six) hours as needed., Disp: , Rfl:  .  levothyroxine (SYNTHROID, LEVOTHROID) 112 MCG tablet, Take 125 mcg by mouth daily. , Disp: , Rfl:  .  Multiple Vitamins-Minerals (MULTIVITAMIN ADULT PO), Take by mouth daily., Disp: , Rfl:  .  pantoprazole (PROTONIX) 40 MG tablet, Take 40 mg by mouth daily., Disp: , Rfl:  .  rosuvastatin (CRESTOR) 10 MG tablet, Take 10 mg by mouth daily., Disp: , Rfl:  .  sitaGLIPtin-metformin (JANUMET) 50-500 MG tablet, Take 1 tablet by mouth 2 (two) times daily with a meal., Disp: , Rfl:  .  TURMERIC PO, Take by mouth daily., Disp: , Rfl:  .  meclizine (ANTIVERT) 25 MG tablet, Take 25 mg by mouth 3 (three) times daily as needed for dizziness., Disp: , Rfl:  .  ondansetron (ZOFRAN) 4 MG tablet, Take 1-2 tabs by mouth every 8 hours as  needed for nausea/vomiting (Patient not taking: Reported on 11/10/2015), Disp: 30 tablet, Rfl: 0  Imaging Review  Left KNEE XRAY  AP lateral and sunrise views of the left knee are ordered and interpreted  by me in the office today.Impression: Patient has severe degenerative  changes with complete loss of joint space in the medial compartment. 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 Patella tracks laterally and the trochlear groove with mild patellofemoral  osteoarthritis.No evidence of acute bony normality or effusion.  Complexity Note: Imaging results reviewed. Results shared with Ms. Rhines, using Layman's terms.                         ROS  Cardiovascular: Daily Aspirin intake, High blood pressure and Blood thinners:  Anticoagulant Pulmonary or Respiratory: Shortness of breath and Snoring  Neurological: No reported neurological signs or symptoms such as seizures, abnormal skin sensations, urinary and/or fecal incontinence, being born with an abnormal open spine and/or a tethered spinal cord Review of Past Neurological Studies:  Results for orders placed or performed during the hospital encounter of 07/20/11  CT Head Wo Contrast   Narrative   *RADIOLOGY REPORT*  Clinical Data: Severe occipital headache, history of diabetes, hypertension, GERD, thyroidectomy, pulmonary embolism  CT HEAD WITHOUT CONTRAST  Technique:  Contiguous axial images were obtained from the base of the skull through the vertex without contrast.  Comparison: None  Findings: Normal ventricular morphology. No midline shift or mass effect. Normal appearance of brain parenchyma. No intracranial hemorrhage, mass lesion, or acute infarction. Visualized paranasal sinuses and mastoid air cells clear. Bones demineralized.  IMPRESSION: No acute intracranial abnormalities.  Original Report Authenticated By: Burnetta Sabin, M.D.   Psychological-Psychiatric: No reported psychological or psychiatric signs or symptoms such as difficulty sleeping, anxiety, depression, delusions or hallucinations (schizophrenial), mood swings (bipolar disorders) or suicidal ideations or attempts Gastrointestinal: Heartburn due to stomach pushing into lungs (Hiatal hernia) and Reflux or heatburn Genitourinary: No reported renal  or genitourinary signs or symptoms such as difficulty voiding or producing urine, peeing blood, non-functioning kidney, kidney stones, difficulty emptying the bladder, difficulty controlling the flow of urine, or chronic kidney disease Hematological: No reported hematological signs or symptoms such as prolonged bleeding, low or poor functioning platelets, bruising or bleeding easily, hereditary bleeding problems, low energy levels due to low hemoglobin or being anemic Endocrine: High blood sugar requiring insulin (IDDM) and Slow thyroid Rheumatologic: No reported rheumatological signs and symptoms such as fatigue, joint pain, tenderness, swelling, redness, heat, stiffness, decreased range of motion, with or without associated rash Musculoskeletal: Negative for myasthenia gravis, muscular dystrophy, multiple sclerosis or malignant hyperthermia Work History: Retired  Allergies  Ms. Selman has No Known Allergies.  Laboratory Chemistry  Inflammation Markers (CRP: Acute Phase) (ESR: Chronic Phase) No results found for: CRP, ESRSEDRATE, LATICACIDVEN                       Rheumatology Markers No results found for: RF, ANA, LABURIC, URICUR, LYMEIGGIGMAB, LYMEABIGMQN, HLAB27                      Renal Function Markers Lab Results  Component Value Date   BUN 14 10/27/2015   CREATININE 0.59 10/27/2015   GFRAA >60 10/27/2015   GFRNONAA >60 10/27/2015  Hepatic Function Markers Lab Results  Component Value Date   AST 19 10/27/2015   ALT 18 10/27/2015   ALBUMIN 4.2 10/27/2015   ALKPHOS 50 10/27/2015                        Electrolytes Lab Results  Component Value Date   NA 139 10/27/2015   K 3.5 10/27/2015   CL 107 10/27/2015   CALCIUM 9.0 10/27/2015                        Neuropathy Markers No results found for: VITAMINB12, FOLATE, HGBA1C, HIV                      Bone Pathology Markers No results found for: VD25OH, VD125OH2TOT, GL8756EP3, IR5188CZ6,  25OHVITD1, 25OHVITD2, 25OHVITD3, TESTOFREE, TESTOSTERONE                       Coagulation Parameters Lab Results  Component Value Date   INR 0.9 07/22/2007   LABPROT 12.2 07/22/2007   PLT 236 10/27/2015                        Cardiovascular Markers Lab Results  Component Value Date   BNP 16 08/11/2013   TROPONINI <0.03 10/27/2015   HGB 13.6 10/27/2015   HCT 39.2 10/27/2015                         CA Markers No results found for: CEA, CA125, LABCA2                      Note: Lab results reviewed.  PFSH  Drug: Ms. Riede  reports that she does not use drugs. Alcohol:  reports that she drinks about 0.6 oz of alcohol per week. Tobacco:  reports that she has never smoked. She has never used smokeless tobacco. Medical:  has a past medical history of Arthritis, Cancer ([place]), Diabetes mellitus type 2 in obese ([place]), GERD (gastroesophageal reflux disease), Headache, HLD (hyperlipidemia), HTN (hypertension), Hypothyroidism, Localized swelling of both lower legs, Neck pain, Numbness and tingling, Pulmonary embolus ([place]), and Vertigo. Family: family history includes Breast cancer (age of onset: 57) in her maternal aunt; Breast cancer (age of onset: 20) in her paternal aunt; Diabetes in her father; Liver cancer in her father; Lung cancer in her father; Stroke in her other.  Past Surgical History:  Procedure Laterality Date  . c-sections     x2   . CATARACT EXTRACTION W/PHACO Left 08/27/2017   Procedure: CATARACT EXTRACTION PHACO AND INTRAOCULAR LENS PLACEMENT ([place]) LEFT DIABETIC;  Surgeon: Leandrew Koyanagi, MD;  Location: [place];  Service: Ophthalmology;  Laterality: Left;  Diabetic - oral meds  . COLONOSCOPY N/A 11/10/2015   Procedure: COLONOSCOPY;  Surgeon: Hulen Luster, MD;  Location: [place];  Service: Gastroenterology;  Laterality: N/A;  . ESOPHAGOGASTRODUODENOSCOPY N/A 11/10/2015   Procedure: ESOPHAGOGASTRODUODENOSCOPY (EGD) with dialation;  Surgeon: Hulen Luster, MD;  Location: [place];  Service: Gastroenterology;  Laterality: N/A;  . THYROIDECTOMY     Active Ambulatory Problems    Diagnosis Date Noted  . DIAB W/O COMP TYPE II/UNS NOT STATED UNCNTRL 09/22/2009  . OTHER AND UNSPECIFIED HYPERLIPIDEMIA 09/22/2009  . UNSPECIFIED ESSENTIAL HYPERTENSION 09/22/2009  . FATIGUE / MALAISE 09/22/2009  . EDEMA 09/22/2009  . CHEST PAIN UNSPECIFIED  09/22/2009   Resolved Ambulatory Problems    Diagnosis Date Noted  . No Resolved Ambulatory Problems   Past Medical History:  Diagnosis Date  . Arthritis   . Cancer ([place])   . Diabetes mellitus type 2 in obese ([place])   . GERD (gastroesophageal reflux disease)   . Headache   . HLD (hyperlipidemia)   . HTN (hypertension)   . Hypothyroidism   . Localized swelling of both lower legs   . Neck pain   . Numbness and tingling   . Pulmonary embolus ([place])   . Vertigo    Constitutional Exam  General appearance: Well nourished, well developed, and well hydrated. In no apparent acute distress Vitals:   01/08/18 1300  BP: 140/80  Pulse: 78  Resp: 18  Temp: 98.3 F (36.8 C)  TempSrc: Oral  SpO2: 98%  Weight: 196 lb (88.9 kg)  Height: '5\' 6"'$  (1.676 m)   BMI Assessment: Estimated body mass index is 31.64 kg/m as calculated from the following:   Height as of this encounter: '5\' 6"'$  (1.676 m).   Weight as of this encounter: 196 lb (88.9 kg).  BMI interpretation table: BMI level Category Range association with higher incidence of chronic pain  <18 kg/m2 Underweight   18.5-24.9 kg/m2 Ideal body weight   25-29.9 kg/m2 Overweight Increased incidence by 20%  30-34.9 kg/m2 Obese (Class I) Increased incidence by 68%  35-39.9 kg/m2 Severe obesity (Class II) Increased incidence by 136%  >40 kg/m2 Extreme obesity (Class III) Increased incidence by 254%   Patient's current BMI Ideal Body weight  Body mass index is 31.64 kg/m. Ideal body weight: 59.3 kg (130 lb 11.7 oz) Adjusted ideal body weight:  71.1 kg (156 lb 13.4 oz)   BMI Readings from Last 4 Encounters:  01/08/18 31.64 kg/m  08/27/17 29.91 kg/m  11/10/15 29.29 kg/m  10/27/15 29.91 kg/m   Wt Readings from Last 4 Encounters:  01/08/18 196 lb (88.9 kg)  08/27/17 191 lb (86.6 kg)  11/10/15 187 lb (84.8 kg)  10/27/15 191 lb (86.6 kg)  Psych/Mental status: Alert, oriented x 3 (person, place, & time)       Eyes: PERLA Respiratory: No evidence of acute respiratory distress  Cervical Spine Area Exam  Skin & Axial Inspection: No masses, redness, edema, swelling, or associated skin lesions Alignment: Symmetrical Functional ROM: Unrestricted ROM      Stability: No instability detected Muscle Tone/Strength: Functionally intact. No obvious neuro-muscular anomalies detected. Sensory (Neurological): Unimpaired Palpation: No palpable anomalies              Upper Extremity (UE) Exam    Side: Right upper extremity  Side: Left upper extremity  Skin & Extremity Inspection: Skin color, temperature, and hair growth are WNL. No peripheral edema or cyanosis. No masses, redness, swelling, asymmetry, or associated skin lesions. No contractures.  Skin & Extremity Inspection: Skin color, temperature, and hair growth are WNL. No peripheral edema or cyanosis. No masses, redness, swelling, asymmetry, or associated skin lesions. No contractures.  Functional ROM: Unrestricted ROM          Functional ROM: Unrestricted ROM          Muscle Tone/Strength: Functionally intact. No obvious neuro-muscular anomalies detected.  Muscle Tone/Strength: Functionally intact. No obvious neuro-muscular anomalies detected.  Sensory (Neurological): Unimpaired          Sensory (Neurological): Unimpaired          Palpation: No palpable anomalies  Palpation: No palpable anomalies              Provocative Test(s):  Phalen's test: deferred Tinel's test: deferred Apley's scratch test (touch opposite shoulder):  Action 1 (Across chest): deferred Action 2  (Overhead): deferred Action 3 (LB reach): deferred   Provocative Test(s):  Phalen's test: deferred Tinel's test: deferred Apley's scratch test (touch opposite shoulder):  Action 1 (Across chest): deferred Action 2 (Overhead): deferred Action 3 (LB reach): deferred    Thoracic Spine Area Exam  Skin & Axial Inspection: No masses, redness, or swelling Alignment: Symmetrical Functional ROM: Unrestricted ROM Stability: No instability detected Muscle Tone/Strength: Functionally intact. No obvious neuro-muscular anomalies detected. Sensory (Neurological): Unimpaired Muscle strength & Tone: No palpable anomalies  Lumbar Spine Area Exam  Skin & Axial Inspection: No masses, redness, or swelling Alignment: Symmetrical Functional ROM: Unrestricted ROM       Stability: No instability detected Muscle Tone/Strength: Functionally intact. No obvious neuro-muscular anomalies detected. Sensory (Neurological): Unimpaired Palpation: No palpable anomalies       Provocative Tests: Lumbar Hyperextension/rotation test: deferred today       Lumbar quadrant test (Kemp's test): deferred today       Lumbar Lateral bending test: deferred today       Patrick's Maneuver: deferred today                   FABER test: deferred today       Thigh-thrust test: deferred today       S-I compression test: deferred today       S-I distraction test: deferred today        Gait & Posture Assessment  Ambulation: Unassisted Gait: Relatively normal for age and body habitus Posture: WNL   Lower Extremity Exam    Side: Right lower extremity  Side: Left lower extremity  Stability: No instability observed          Stability: No instability observed          Skin & Extremity Inspection: Skin color, temperature, and hair growth are WNL. No peripheral edema or cyanosis. No masses, redness, swelling, asymmetry, or associated skin lesions. No contractures.  Skin & Extremity Inspection: Skin color, temperature, and hair growth  are WNL. No peripheral edema or cyanosis. No masses, redness, swelling, asymmetry, or associated skin lesions. No contractures.  Functional ROM: Unrestricted ROM                  Functional ROM: Decreased ROM for knee joint          Muscle Tone/Strength: Functionally intact. No obvious neuro-muscular anomalies detected.  Muscle Tone/Strength: Functionally intact. No obvious neuro-muscular anomalies detected.  Sensory (Neurological): Unimpaired  Sensory (Neurological): Arthropathic arthralgia  Palpation: No palpable anomalies  Palpation: Complains of area being tender to palpation   Assessment  Primary Diagnosis & Pertinent Problem List: The primary encounter diagnosis was Primary osteoarthritis of left knee. Diagnoses of Chronic pain of left knee and Chronic pain syndrome were also pertinent to this visit.  Visit Diagnosis (New problems to examiner): 1. Primary osteoarthritis of left knee   2. Chronic pain of left knee   3. Chronic pain syndrome   General Recommendations: The pain condition that the patient suffers from is best treated with a multidisciplinary approach that involves an increase in physical activity to prevent de-conditioning and worsening of the pain cycle, as well as psychological counseling (formal and/or informal) to address the co-morbid psychological affects of pain. Treatment will often involve  judicious use of pain medications and interventional procedures to decrease the pain, allowing the patient to participate in the physical activity that will ultimately produce long-lasting pain reductions. The goal of the multidisciplinary approach is to return the patient to a higher level of overall function and to restore their ability to perform activities of daily living.  62 year old female with a history of left knee osteoarthritis status post left knee intra-articular steroid injections as well as left knee Visco supplementation which did not result in any significant long-term  benefit for her left knee pain.  Patient is tried various over-the-counter as well as prescription strength medications including ibuprofen, acetaminophen, Mobic without any significant improvement.  Patient is referred here for a diagnostic left genicular nerve block and possible radiofrequency ablation.  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  Risks and benefits of diagnostic left knee genicular nerve block were discussed with the patient.  Patient would like to proceed.  If diagnostic block is helpful, can proceed with left knee genicular nerve radiofrequency ablation.  Plan: -Diagnostic left knee genicular nerve block under fluoroscopy without sedation.  Can consider left knee genicular nerve radiofrequency ablation thereafter if diagnostic block effective.  Ordered Lab-work, Procedure(s), Referral(s), & Consult(s): Orders Placed This Encounter  Procedures  . GENICULAR NERVE BLOCK   Provider-requested follow-up: Return in about 6 days (around 01/14/2018) for Procedure.  Future Appointments  Date Time Provider [place]  01/14/2018  9:45 AM Gillis Santa, MD [facility] None    Primary Care Physician: [name], MD Location: [facility] Note by: Gillis Santa, M.D, Date: 01/08/2018; Time: 2:19 PM  Patient Instructions  ____________________________________________________________________________________________  Preparing for your procedure (without sedation)  Instructions: . Oral Intake: Do not eat or drink anything for at least 3 hours prior to your procedure. . Transportation: Unless otherwise stated by your physician, you may drive yourself after the procedure. . Blood Pressure Medicine: Take your blood pressure medicine with a sip of water the morning of the procedure. . Blood thinners:  . Diabetics on insulin: Notify the staff so that you can be scheduled 1st case in the  morning. If your diabetes requires high dose insulin, take only  of your normal insulin dose the morning of the procedure and notify the staff that you have done so. . Preventing infections: Shower with an antibacterial soap the morning of your procedure.  . Build-up your immune system: Take 1000 mg of Vitamin C with every meal (3 times a day) the day prior to your procedure. Marland Kitchen Antibiotics: Inform the staff if you have a condition or reason that requires you to take antibiotics before dental procedures. . Pregnancy: If you are pregnant, call and cancel the procedure. . Sickness: If you have a cold, fever, or any active infections, call and cancel the procedure. . Arrival: You must be in the facility at least 30 minutes prior to your scheduled procedure. . Children: Do not bring any children with you. . Dress appropriately: Bring dark clothing that you would not mind if they get stained. . Valuables: Do not bring any jewelry or valuables.  Procedure appointments are reserved for interventional treatments only. Marland Kitchen No Prescription Refills. . No medication changes will be discussed during procedure appointments. . No disability issues will be discussed.  Remember:  Regular Business hours are:  Monday to Thursday 8:00 AM to 4:00 PM  Provider's Schedule: Milinda Pointer, MD:  Procedure days: Tuesday and  Thursday 7:30 AM to 4:00 PM  Gillis Santa, MD:  Procedure days: Monday and Wednesday 7:30 AM to 4:00 PM ____________________________________________________________________________________________  ____________________________________________________________________________________________  Genicular Nerve Block  What is a genicular nerve block? A genicular nerve block is the injection of a local anesthetic to block the nerves that transmits pain from the knee.  What is the purpose of a facet nerve block? A genicular nerve block is a diagnostic procedure to determine if the pathologic  changes (i.e. arthritis, meniscal tears, etc) and inflammation within the knee joint is the source of your knee pain. It also confirms that the knee pain will respond well to the actual treatment procedure. If a genicular nerve block works, it will give you relief for several hours. After that, the pain is expected to return to normal. This test is always performed twice (usually a week or two apart) because two successful tests are required to move onto treatment. If both diagnostic tests are positive, then we schedule a treatment called radiofrequency (RF) ablation. In this procedure, the same nerves are cauterized, which typically leads to pain relief for 4 -18 months. If this process works well for one knee, it can be performed on the other knee if needed.  How is the procedure performed? You will be placed on the procedure table. The injection site is sterilized with either iodine or chlorhexadine. The site to be injected is numbed with a local anesthetic, and a needle is directed to the target area. X-ray guidance is used to ensure proper placement and positioning of the needle. When the needle is properly positioned near the genicular nerve, local anesthetic is injected to numb that nerve. This will be repeated at multiple sites around the knee to block all genicular nerves.  Will the procedure be painful? The injection can be painful and we therefore provide the option of receiving IV sedation. IV sedation, combined with local anesthetic, can make the injection nearly pain free. It allows you to remain very still during the procedure, which can also make the injection easier, faster, and more successful. If you decide to have IV sedation, you must have a driver to get you home safely afterwards. In addition, you cannot have anything to eat or drink within 8 hours of your appointment (clear liquids are allowed until 3 hours before the procedure). If you take medications for diabetes, these medications  may need to be adjusted the morning of the procedure. Your primary care physician can help you with this adjustment.  What are the discharge instructions? If you received IV sedation do not drive or operate machinery for at least 24 hours after the procedure. You may return to work the next day following your procedure. You may resume your normal diet immediately. Do not engage in any strenuous activity for 24 hours. You should, however, engage in moderate activity that typically causes your ususal pain. If the block works, those activities should not be painful for several hours after the injection. Do not take a bath, swim, or use a hot tub for 24 hours (you may take a shower). Call the office if you have any of the following: severe pain afterwards (different than your usual symptoms), redness/swelling/discharge at the injection site(s), fevers/chills, difficulty with bowel or bladder functions.  What are the risks and side effects? The complication rate for this procedure is very low. Whenever a needle enters the skin, bleeding or infection can occur. Some other serious but extremely rare risks include paralysis and death. You  may have an allergic reaction to any of the medications used. If you have a known allergy to any medications, especially local anesthetics, notify our staff before the procedure takes place. You may experience any of the following side effects up to 4 - 6 hours after the procedure: . Leg muscle weakness or numbness may occur due to the local anesthetic affecting the nerves that control your legs (this is a temporary affect and it is not paralysis). If you have any leg weakness or numbness, walk only with assistance in order to prevent falls and injury. Your leg strength will return slowly and completely. . Dizziness may occur due to a decrease in your blood pressure. If this occurs, remain in a seated or lying position. Gradually sit up, and then stand after at least 10 minutes  of sitting. . Mild headaches may occur. Drink fluids and take pain medications if needed. If the headaches persist or become severe, call the office. . Mild discomfort at the injection site can occur. This typically lasts for a few hours but can persist for a couple days. If this occurs, take anti-inflammatories or pain medications, apply ice to the area the day of the procedure. If it persists, apply moist heat in the day(s) following.  The side effects listed above can be normal. They are not dangerous and will resolve on their own. If, however, you experience any of the following, a complication may have occurred and you should either contact your doctor. If he is not readily available, then you should proceed to the closest urgent care center for evaluation: . Severe or progressive pain at the injection site(s) . Arm or leg weakness that progressively worsens or persists for longer than 8 hours . Severe or progressive redness, swelling, or discharge from the injections site(s) . Fevers, chills, nausea, or vomiting . Bowel or bladder dysfunction (i.e. inability to urinate or pass stool or difficulty controlling either)  How long does it take for the procedure to work? You should feel relief from your usual pain within the first hour. Again, this is only expected to last for several hours, at the most. Remember, you may be sore in the middle part of your back from the needles, and you must distinguish this from your usual pain. ____________________________________________________________________________________________

## 2018-01-08 NOTE — Patient Instructions (Signed)
____________________________________________________________________________________________  Preparing for your procedure (without sedation)  Instructions: . Oral Intake: Do not eat or drink anything for at least 3 hours prior to your procedure. . Transportation: Unless otherwise stated by your physician, you may drive yourself after the procedure. . Blood Pressure Medicine: Take your blood pressure medicine with a sip of water the morning of the procedure. . Blood thinners:  . Diabetics on insulin: Notify the staff so that you can be scheduled 1st case in the morning. If your diabetes requires high dose insulin, take only  of your normal insulin dose the morning of the procedure and notify the staff that you have done so. . Preventing infections: Shower with an antibacterial soap the morning of your procedure.  . Build-up your immune system: Take 1000 mg of Vitamin C with every meal (3 times a day) the day prior to your procedure. Marland Kitchen Antibiotics: Inform the staff if you have a condition or reason that requires you to take antibiotics before dental procedures. . Pregnancy: If you are pregnant, call and cancel the procedure. . Sickness: If you have a cold, fever, or any active infections, call and cancel the procedure. . Arrival: You must be in the facility at least 30 minutes prior to your scheduled procedure. . Children: Do not bring any children with you. . Dress appropriately: Bring dark clothing that you would not mind if they get stained. . Valuables: Do not bring any jewelry or valuables.  Procedure appointments are reserved for interventional treatments only. Marland Kitchen No Prescription Refills. . No medication changes will be discussed during procedure appointments. . No disability issues will be discussed.  Remember:  Regular Business hours are:  Monday to Thursday 8:00 AM to 4:00 PM  Provider's Schedule: Milinda Pointer, MD:  Procedure days: Tuesday and Thursday 7:30 AM to 4:00  PM  Gillis Santa, MD:  Procedure days: Monday and Wednesday 7:30 AM to 4:00 PM ____________________________________________________________________________________________  ____________________________________________________________________________________________  Genicular Nerve Block  What is a genicular nerve block? A genicular nerve block is the injection of a local anesthetic to block the nerves that transmits pain from the knee.  What is the purpose of a facet nerve block? A genicular nerve block is a diagnostic procedure to determine if the pathologic changes (i.e. arthritis, meniscal tears, etc) and inflammation within the knee joint is the source of your knee pain. It also confirms that the knee pain will respond well to the actual treatment procedure. If a genicular nerve block works, it will give you relief for several hours. After that, the pain is expected to return to normal. This test is always performed twice (usually a week or two apart) because two successful tests are required to move onto treatment. If both diagnostic tests are positive, then we schedule a treatment called radiofrequency (RF) ablation. In this procedure, the same nerves are cauterized, which typically leads to pain relief for 4 -18 months. If this process works well for one knee, it can be performed on the other knee if needed.  How is the procedure performed? You will be placed on the procedure table. The injection site is sterilized with either iodine or chlorhexadine. The site to be injected is numbed with a local anesthetic, and a needle is directed to the target area. X-ray guidance is used to ensure proper placement and positioning of the needle. When the needle is properly positioned near the genicular nerve, local anesthetic is injected to numb that nerve. This will be repeated at multiple sites  around the knee to block all genicular nerves.  Will the procedure be painful? The injection can be  painful and we therefore provide the option of receiving IV sedation. IV sedation, combined with local anesthetic, can make the injection nearly pain free. It allows you to remain very still during the procedure, which can also make the injection easier, faster, and more successful. If you decide to have IV sedation, you must have a driver to get you home safely afterwards. In addition, you cannot have anything to eat or drink within 8 hours of your appointment (clear liquids are allowed until 3 hours before the procedure). If you take medications for diabetes, these medications may need to be adjusted the morning of the procedure. Your primary care physician can help you with this adjustment.  What are the discharge instructions? If you received IV sedation do not drive or operate machinery for at least 24 hours after the procedure. You may return to work the next day following your procedure. You may resume your normal diet immediately. Do not engage in any strenuous activity for 24 hours. You should, however, engage in moderate activity that typically causes your ususal pain. If the block works, those activities should not be painful for several hours after the injection. Do not take a bath, swim, or use a hot tub for 24 hours (you may take a shower). Call the office if you have any of the following: severe pain afterwards (different than your usual symptoms), redness/swelling/discharge at the injection site(s), fevers/chills, difficulty with bowel or bladder functions.  What are the risks and side effects? The complication rate for this procedure is very low. Whenever a needle enters the skin, bleeding or infection can occur. Some other serious but extremely rare risks include paralysis and death. You may have an allergic reaction to any of the medications used. If you have a known allergy to any medications, especially local anesthetics, notify our staff before the procedure takes place. You may  experience any of the following side effects up to 4 - 6 hours after the procedure: . Leg muscle weakness or numbness may occur due to the local anesthetic affecting the nerves that control your legs (this is a temporary affect and it is not paralysis). If you have any leg weakness or numbness, walk only with assistance in order to prevent falls and injury. Your leg strength will return slowly and completely. . Dizziness may occur due to a decrease in your blood pressure. If this occurs, remain in a seated or lying position. Gradually sit up, and then stand after at least 10 minutes of sitting. . Mild headaches may occur. Drink fluids and take pain medications if needed. If the headaches persist or become severe, call the office. . Mild discomfort at the injection site can occur. This typically lasts for a few hours but can persist for a couple days. If this occurs, take anti-inflammatories or pain medications, apply ice to the area the day of the procedure. If it persists, apply moist heat in the day(s) following.  The side effects listed above can be normal. They are not dangerous and will resolve on their own. If, however, you experience any of the following, a complication may have occurred and you should either contact your doctor. If he is not readily available, then you should proceed to the closest urgent care center for evaluation: . Severe or progressive pain at the injection site(s) . Arm or leg weakness that progressively worsens or persists for  longer than 8 hours . Severe or progressive redness, swelling, or discharge from the injections site(s) . Fevers, chills, nausea, or vomiting . Bowel or bladder dysfunction (i.e. inability to urinate or pass stool or difficulty controlling either)  How long does it take for the procedure to work? You should feel relief from your usual pain within the first hour. Again, this is only expected to last for several hours, at the most. Remember, you may  be sore in the middle part of your back from the needles, and you must distinguish this from your usual pain. ____________________________________________________________________________________________

## 2018-01-14 ENCOUNTER — Encounter: Payer: Self-pay | Admitting: Student in an Organized Health Care Education/Training Program

## 2018-01-14 ENCOUNTER — Ambulatory Visit
Admission: RE | Admit: 2018-01-14 | Discharge: 2018-01-14 | Disposition: A | Discharge status: Home / Self Care | Payer: BC Managed Care – PPO | Source: Ambulatory Visit | Attending: Student in an Organized Health Care Education/Training Program | Admitting: Student in an Organized Health Care Education/Training Program

## 2018-01-14 ENCOUNTER — Other Ambulatory Visit: Payer: Self-pay

## 2018-01-14 ENCOUNTER — Ambulatory Visit (HOSPITAL_BASED_OUTPATIENT_CLINIC_OR_DEPARTMENT_OTHER): Payer: BC Managed Care – PPO | Admitting: Student in an Organized Health Care Education/Training Program

## 2018-01-14 VITALS — BP 147/97 | HR 79 | Temp 98.3°F | Resp 16 | Ht 66.5 in | Wt 195.0 lb

## 2018-01-14 DIAGNOSIS — M1712 Unilateral primary osteoarthritis, left knee: Secondary | ICD-10-CM

## 2018-01-14 DIAGNOSIS — M25562 Pain in left knee: Secondary | ICD-10-CM

## 2018-01-14 DIAGNOSIS — G894 Chronic pain syndrome: Secondary | ICD-10-CM | POA: Insufficient documentation

## 2018-01-14 DIAGNOSIS — G8929 Other chronic pain: Secondary | ICD-10-CM

## 2018-01-14 MED ORDER — DEXAMETHASONE SODIUM PHOSPHATE 10 MG/ML IJ SOLN
10.0000 mg | Freq: Once | INTRAMUSCULAR | Status: AC
  Administered 2018-01-14: 10 mg
  Filled 2018-01-14: qty 1

## 2018-01-14 MED ORDER — LIDOCAINE HCL 1 % IJ SOLN
10.0000 mL | Freq: Once | INTRAMUSCULAR | Status: AC
  Administered 2018-01-14: 5 mL
  Filled 2018-01-14: qty 10

## 2018-01-14 MED ORDER — ROPIVACAINE HCL 2 MG/ML IJ SOLN
10.0000 mL | Freq: Once | INTRAMUSCULAR | Status: AC
  Administered 2018-01-14: 10 mL
  Filled 2018-01-14: qty 10

## 2018-01-14 NOTE — Progress Notes (Signed)
Safety precautions to be maintained throughout the outpatient stay will include: orient to surroundings, keep bed in low position, maintain call bell within reach at all times, provide assistance with transfer out of bed and ambulation.  

## 2018-01-14 NOTE — Patient Instructions (Signed)

## 2018-01-14 NOTE — Progress Notes (Signed)
Patient's Name: Alisha Sanchez  MRN: [id number]  Referring Provider: [name], MD  DOB: [birth year]  PCP: [name], MD  DOS: 01/14/2018  Note by: Gillis Santa, MD  Service setting: Ambulatory outpatient  Specialty: Interventional Pain Management  Patient type: Established  Location: [facility] (AMB) Pain Management Facility  Visit type: Interventional Procedure   Primary Reason for Visit: Interventional Pain Management Treatment. CC: Procedure (Left Knee GNB)  Procedure:          Anesthesia, Analgesia, Anxiolysis:  Type: Genicular Nerves Block (Superior-lateral, Superior-medial, and Inferior-medial Genicular Nerves) #1  CPT: 62947      Primary Purpose: Diagnostic Region: Lateral, Anterior, and Medial aspects of the knee joint, above and below the knee joint proper. Level: Superior and inferior to the knee joint. Target Area: For Genicular Nerve block(s), the targets are: the superior-lateral genicular nerve, located in the lateral distal portion of the femoral shaft as it curves to form the lateral epicondyle, in the region of the distal femoral metaphysis; the superior-medial genicular nerve, located in the medial distal portion of the femoral shaft as it curves to form the medial epicondyle; and the inferior-medial genicular nerve, located in the medial, proximal portion of the tibial shaft, as it curves to form the medial epicondyle, in the region of the proximal tibial metaphysis. Approach: Anterior, percutaneous, ipsilateral approach. Laterality: Left knee Position: Modified Fowler's position with pillows under the targeted knee(s).  Type: Local Anesthesia Indication(s): Analgesia         Route: Infiltration ([place]/IM) IV Access: Declined Sedation: Declined  Local Anesthetic: Lidocaine 1%   Indications: 1. Primary osteoarthritis of left knee   2. Chronic pain of left knee   3. Chronic pain syndrome    Pain Score: Pre-procedure: 8 /10 Post-procedure: 0-No pain/10  Pre-op  Assessment:  Alisha Sanchez is a 62 y.o. (year old), female patient, seen today for interventional treatment. She  has a past surgical history that includes Thyroidectomy; c-sections; Colonoscopy (N/A, 11/10/2015); Esophagogastroduodenoscopy (N/A, 11/10/2015); and Cataract extraction w/PHACO (Left, 08/27/2017). Alisha Sanchez has a current medication list which includes the following prescription(s): alendronate, amlodipine, cyanocobalamin, hydrochlorothiazide, ibuprofen, levothyroxine, multiple vitamins-minerals, pantoprazole, rosuvastatin, sitagliptin-metformin, turmeric, meclizine, and ondansetron. Her primarily concern today is the Procedure (Left Knee GNB)  Initial Vital Signs:  Pulse/HCG Rate: 79ECG Heart Rate: 77 Temp: 98.3 F (36.8 C) Resp: 18 BP: 140/87 SpO2: 100 %  BMI: Estimated body mass index is 31 kg/m as calculated from the following:   Height as of this encounter: 5' 6.5" (1.689 m).   Weight as of this encounter: 195 lb (88.5 kg).  Risk Assessment: Allergies: Reviewed. She has No Known Allergies.  Allergy Precautions: None required Coagulopathies: Reviewed. None identified.  Blood-thinner therapy: None at this time Active Infection(s): Reviewed. None identified. Alisha Sanchez is afebrile  Site Confirmation: [name] was asked to confirm the procedure and laterality before marking the site Procedure checklist: Completed Consent: Before the procedure and under the influence of no sedative(s), amnesic(s), or anxiolytics, the patient was informed of the treatment options, risks and possible complications. To fulfill our ethical and legal obligations, as recommended by the American Medical Association's Code of Ethics, I have informed the patient of my clinical impression; the nature and purpose of the treatment or procedure; the risks, benefits, and possible complications of the intervention; the alternatives, including doing nothing; the risk(s) and benefit(s) of the  alternative treatment(s) or procedure(s); and the risk(s) and benefit(s) of doing nothing. The patient was provided information about the  general risks and possible complications associated with the procedure. These may include, but are not limited to: failure to achieve desired goals, infection, bleeding, organ or nerve damage, allergic reactions, paralysis, and death. In addition, the patient was informed of those risks and complications associated to the procedure, such as failure to decrease pain; infection; bleeding; organ or nerve damage with subsequent damage to sensory, motor, and/or autonomic systems, resulting in permanent pain, numbness, and/or weakness of one or several areas of the body; allergic reactions; (i.e.: anaphylactic reaction); and/or death. Furthermore, the patient was informed of those risks and complications associated with the medications. These include, but are not limited to: allergic reactions (i.e.: anaphylactic or anaphylactoid reaction(s)); adrenal axis suppression; blood sugar elevation that in diabetics may result in ketoacidosis or comma; water retention that in patients with history of congestive heart failure may result in shortness of breath, pulmonary edema, and decompensation with resultant heart failure; weight gain; swelling or edema; medication-induced neural toxicity; particulate matter embolism and blood vessel occlusion with resultant organ, and/or nervous system infarction; and/or aseptic necrosis of one or more joints. Finally, the patient was informed that Medicine is not an exact science; therefore, there is also the possibility of unforeseen or unpredictable risks and/or possible complications that may result in a catastrophic outcome. The patient indicated having understood very clearly. We have given the patient no guarantees and we have made no promises. Enough time was given to the patient to ask questions, all of which were answered to the patient's  satisfaction. Alisha Sanchez has indicated that she wanted to continue with the procedure. Attestation: I, the ordering provider, attest that I have discussed with the patient the benefits, risks, side-effects, alternatives, likelihood of achieving goals, and potential problems during recovery for the procedure that I have provided informed consent. Date  Time: 01/14/2018  9:18 AM  Pre-Procedure Preparation:  Monitoring: As per clinic protocol. Respiration, ETCO2, SpO2, BP, heart rate and rhythm monitor placed and checked for adequate function Safety Precautions: Patient was assessed for positional comfort and pressure points before starting the procedure. Time-out: I initiated and conducted the "Time-out" before starting the procedure, as per protocol. The patient was asked to participate by confirming the accuracy of the "Time Out" information. Verification of the correct person, site, and procedure were performed and confirmed by me, the nursing staff, and the patient. "Time-out" conducted as per Joint Commission's Universal Protocol (UP.01.01.01). Time: 0956  Description of Procedure:          Area Prepped: Entire knee area, from mid-thigh to mid-shin, lateral, anterior, and medial aspects. Prepping solution: ChloraPrep (2% chlorhexidine gluconate and 70% isopropyl alcohol) Safety Precautions: Aspiration looking for blood return was conducted prior to all injections. At no point did we inject any substances, as a needle was being advanced. No attempts were made at seeking any paresthesias. Safe injection practices and needle disposal techniques used. Medications properly checked for expiration dates. SDV (single dose vial) medications used. Latex Allergy precautions taken.   Description of the Procedure: Protocol guidelines were followed. The patient was placed in position over the procedure table. The target area was identified and the area prepped in the usual manner. Skin & deeper tissues  infiltrated with local anesthetic. Appropriate amount of time allowed to pass for local anesthetics to take effect. The procedure needles were then advanced to the target area. Proper needle placement secured. Negative aspiration confirmed. Solution injected in intermittent fashion, asking for systemic symptoms every 0.5cc of injectate. The needles were  then removed and the area cleansed, making sure to leave some of the prepping solution back to take advantage of its long term bactericidal properties.  Vitals:   01/14/18 0921 01/14/18 0958 01/14/18 1003 01/14/18 1011  BP: 140/87 140/78 139/80 (!) 147/97  Pulse: 79     Resp: 18 15 15 16   Temp: 98.3 F (36.8 C)     SpO2: 100% 98% 100% 98%  Weight: 195 lb (88.5 kg)     Height: 5' 6.5" (1.689 m)       Start Time: 0956 hrs. End Time: 1005 hrs. Materials:  Needle(s) Type: Regular needle Gauge: 22G Length: 3.5-in Medication(s): Please see orders for medications and dosing details. 5 cc solution made of 4 cc of 0.2% ropivacaine, 1 cc of Decadron 10 mg/cc.  1.5 cc injected at each level above. Imaging Guidance (Non-Spinal):          Type of Imaging Technique: Fluoroscopy Guidance (Non-Spinal) Indication(s): Assistance in needle guidance and placement for procedures requiring needle placement in or near specific anatomical locations not easily accessible without such assistance. Exposure Time: Please see nurses notes. Contrast: None used. Fluoroscopic Guidance: I was personally present during the use of fluoroscopy. "Tunnel Vision Technique" used to obtain the best possible view of the target area. Parallax error corrected before commencing the procedure. "Direction-depth-direction" technique used to introduce the needle under continuous pulsed fluoroscopy. Once target was reached, antero-posterior, oblique, and lateral fluoroscopic projection used confirm needle placement in all planes. Images permanently stored in EMR. Interpretation: No contrast  injected.  Antibiotic Prophylaxis:   Anti-infectives (From admission, onward)   None     Indication(s): None identified  Post-operative Assessment:  Post-procedure Vital Signs:  Pulse/HCG Rate: 7978 Temp: 98.3 F (36.8 C) Resp: 16 BP: (!) 147/97 SpO2: 98 %  EBL: None  Complications: No immediate post-treatment complications observed by team, or reported by patient.  Note: The patient tolerated the entire procedure well. A repeat set of vitals were taken after the procedure and the patient was kept under observation following institutional policy, for this type of procedure. Post-procedural neurological assessment was performed, showing return to baseline, prior to discharge. The patient was provided with post-procedure discharge instructions, including a section on how to identify potential problems. Should any problems arise concerning this procedure, the patient was given instructions to immediately contact us, at any time, without hesitation. In any case, we plan to contact the patient by telephone for a follow-up status report regarding this interventional procedure.  Comments:  No additional relevant information. 5 out of 5 strength bilateral lower extremity: Plantar flexion, dorsiflexion, knee flexion, knee extension.  Plan of Care   Imaging Orders     DG C-Arm 1-60 Min-No Report Procedure Orders    No procedure(s) ordered today    Medications ordered for procedure: Meds ordered this encounter  Medications  . lidocaine (XYLOCAINE) 1 % (with pres) injection 10 mL  . ropivacaine (PF) 2 mg/mL (0.2%) (NAROPIN) injection 10 mL  . dexamethasone (DECADRON) injection 10 mg   Medications administered: We administered lidocaine, ropivacaine (PF) 2 mg/mL (0.2%), and dexamethasone.  See the medical record for exact dosing, route, and time of administration.  New Prescriptions   No medications on file   Disposition: Discharge home  Discharge Date & Time: 01/14/2018; 1015  hrs.   Physician-requested Follow-up: Return in about 3 weeks (around 02/05/2018) for Post Procedure Evaluation.  Future Appointments  Date Time Provider [place]  02/05/2018 12:00 PM Gillis Santa, MD [facility]  None   Primary Care Physician: [name], MD Location: [facility] Note by: Gillis Santa, MD Date: 01/14/2018; Time: 10:35 AM  Disclaimer:  Medicine is not an exact science. The only guarantee in medicine is that nothing is guaranteed. It is important to note that the decision to proceed with this intervention was based on the information collected from the patient. The Data and conclusions were drawn from the patient's questionnaire, the interview, and the physical examination. Because the information was provided in large part by the patient, it cannot be guaranteed that it has not been purposely or unconsciously manipulated. Every effort has been made to obtain as much relevant data as possible for this evaluation. It is important to note that the conclusions that lead to this procedure are derived in large part from the available data. Always take into account that the treatment will also be dependent on availability of resources and existing treatment guidelines, considered by other Pain Management Practitioners as being common knowledge and practice, at the time of the intervention. For Medico-Legal purposes, it is also important to point out that variation in procedural techniques and pharmacological choices are the acceptable norm. The indications, contraindications, technique, and results of the above procedure should only be interpreted and judged by a Board-Certified Interventional Pain Specialist with extensive familiarity and expertise in the same exact procedure and technique.

## 2018-01-15 ENCOUNTER — Telehealth: Payer: Self-pay | Admitting: *Deleted

## 2018-01-15 NOTE — Telephone Encounter (Signed)
Denies problems post procedure yesterday.

## 2018-02-05 ENCOUNTER — Other Ambulatory Visit: Payer: Self-pay

## 2018-02-05 ENCOUNTER — Ambulatory Visit
Payer: BC Managed Care – PPO | Attending: Student in an Organized Health Care Education/Training Program | Admitting: Student in an Organized Health Care Education/Training Program

## 2018-02-05 VITALS — BP 147/78 | HR 75 | Temp 98.3°F | Resp 18 | Ht 67.0 in | Wt 196.0 lb

## 2018-02-05 DIAGNOSIS — Z86711 Personal history of pulmonary embolism: Secondary | ICD-10-CM | POA: Insufficient documentation

## 2018-02-05 DIAGNOSIS — K219 Gastro-esophageal reflux disease without esophagitis: Secondary | ICD-10-CM | POA: Insufficient documentation

## 2018-02-05 DIAGNOSIS — G8929 Other chronic pain: Secondary | ICD-10-CM

## 2018-02-05 DIAGNOSIS — G894 Chronic pain syndrome: Secondary | ICD-10-CM | POA: Diagnosis present

## 2018-02-05 DIAGNOSIS — M1712 Unilateral primary osteoarthritis, left knee: Secondary | ICD-10-CM

## 2018-02-05 DIAGNOSIS — E785 Hyperlipidemia, unspecified: Secondary | ICD-10-CM | POA: Diagnosis not present

## 2018-02-05 DIAGNOSIS — Z7984 Long term (current) use of oral hypoglycemic drugs: Secondary | ICD-10-CM | POA: Insufficient documentation

## 2018-02-05 DIAGNOSIS — Z833 Family history of diabetes mellitus: Secondary | ICD-10-CM | POA: Diagnosis not present

## 2018-02-05 DIAGNOSIS — E669 Obesity, unspecified: Secondary | ICD-10-CM | POA: Diagnosis not present

## 2018-02-05 DIAGNOSIS — R5383 Other fatigue: Secondary | ICD-10-CM | POA: Diagnosis not present

## 2018-02-05 DIAGNOSIS — M25562 Pain in left knee: Secondary | ICD-10-CM | POA: Diagnosis not present

## 2018-02-05 DIAGNOSIS — R079 Chest pain, unspecified: Secondary | ICD-10-CM | POA: Insufficient documentation

## 2018-02-05 DIAGNOSIS — Z791 Long term (current) use of non-steroidal anti-inflammatories (NSAID): Secondary | ICD-10-CM | POA: Diagnosis not present

## 2018-02-05 DIAGNOSIS — E039 Hypothyroidism, unspecified: Secondary | ICD-10-CM | POA: Diagnosis not present

## 2018-02-05 DIAGNOSIS — R200 Anesthesia of skin: Secondary | ICD-10-CM | POA: Insufficient documentation

## 2018-02-05 DIAGNOSIS — R609 Edema, unspecified: Secondary | ICD-10-CM | POA: Diagnosis not present

## 2018-02-05 DIAGNOSIS — Z79890 Hormone replacement therapy (postmenopausal): Secondary | ICD-10-CM | POA: Insufficient documentation

## 2018-02-05 DIAGNOSIS — E119 Type 2 diabetes mellitus without complications: Secondary | ICD-10-CM | POA: Diagnosis not present

## 2018-02-05 DIAGNOSIS — Z6830 Body mass index (BMI) 30.0-30.9, adult: Secondary | ICD-10-CM | POA: Diagnosis not present

## 2018-02-05 DIAGNOSIS — I10 Essential (primary) hypertension: Secondary | ICD-10-CM | POA: Insufficient documentation

## 2018-02-05 DIAGNOSIS — M542 Cervicalgia: Secondary | ICD-10-CM | POA: Insufficient documentation

## 2018-02-05 DIAGNOSIS — Z79899 Other long term (current) drug therapy: Secondary | ICD-10-CM | POA: Diagnosis not present

## 2018-02-05 NOTE — Progress Notes (Signed)
Safety precautions to be maintained throughout the outpatient stay will include: orient to surroundings, keep bed in low position, maintain call bell within reach at all times, provide assistance with transfer out of bed and ambulation.  

## 2018-02-05 NOTE — Progress Notes (Signed)
Patient's Name: Alisha Sanchez  MRN: [id number]  Referring Provider: [name], MD  DOB: 05/15/1956  PCP: Alisha Pink, MD  DOS: 02/05/2018  Note by: Alisha Santa, MD  Service setting: Ambulatory outpatient  Specialty: Interventional Pain Management  Location: [facility] (AMB) Pain Management Facility    Patient type: Established   Primary Reason(s) for Visit: Encounter for post-procedure evaluation of chronic illness with mild to moderate exacerbation CC: 2 Week Follow-up (L Genicular Nerve Block )  HPI  Alisha Sanchez is a 62 y.o. year old, female patient, who comes today for a post-procedure evaluation. 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 Her primarily concern today is the 2 Week Follow-up (L Genicular Nerve Block )  Pain Assessment: Location: Left Knee Radiating: Radiates from anterior to posterior  Onset: More than a month ago Duration: Chronic pain Quality: Throbbing, Aching, Constant Severity: 7 /10 (subjective, self-reported pain score)  Note: Reported level is inconsistent with clinical observations. Clinically the patient looks like a 3/10 A 3/10 is viewed as "Moderate" and described as significantly interfering with activities of daily living (ADL). It becomes difficult to feed, bathe, get dressed, get on and off the toilet or to perform personal hygiene functions. Difficult to get in and out of bed or a chair without assistance. Very distracting. With effort, it can be ignored when deeply involved in activities. Information on the proper use of the pain scale provided to the patient today. When using our objective Pain Scale, levels between 6 and 10/10 are said to belong in an emergency room, as it progressively worsens from a 6/10, described as severely limiting, requiring emergency care not usually available at an  outpatient pain management facility. At a 6/10 level, communication becomes difficult and requires great effort. Assistance to reach the emergency department may be required. Facial flushing and profuse sweating along with potentially dangerous increases in heart rate and blood pressure will be evident. Effect on ADL: "More weight/pressure on leg the harder it is to do anything, wears me out but not as much as before precedure"  Timing: Constant Modifying factors: Ibuprofen and advil; essential oils  BP: (!) 147/78  HR: 75  Alisha Sanchez comes in today for post-procedure evaluation after the treatment done on 01/15/2018.  Further details on both, my assessment(s), as well as the proposed treatment plan, please see below.  Post-Procedure Assessment  01/14/2018 Procedure: LEFT KNEE GNB Pre-procedure pain score:  8/10 Post-procedure pain score: 0/10         Influential Factors: BMI: 30.70 kg/m Intra-procedural challenges: None observed.         Assessment challenges: None detected.              Reported side-effects: None.        Post-procedural adverse reactions or complications: None reported         Sedation: Please see nurses note. When no sedatives are used, the analgesic levels obtained are directly associated to the effectiveness of the local anesthetics. However, when sedation is provided, the level of analgesia obtained during the initial 1 hour following the intervention, is believed to be the result of a combination of factors. These factors may include, but are not limited to: 1. The effectiveness of the local anesthetics used. 2. The effects of the analgesic(s) and/or anxiolytic(s) used. 3. The degree of discomfort experienced by the patient at the  time of the procedure. 4. The patients ability and reliability in recalling and recording the events. 5. The presence and influence of possible secondary gains and/or psychosocial factors. Reported result: Relief experienced during  the 1st hour after the procedure: 100 % (Ultra-Short Term Relief)            Interpretative annotation: Clinically appropriate result. Analgesia during this period is likely to be Local Anesthetic and/or IV Sedative (Analgesic/Anxiolytic) related.          Effects of local anesthetic: The analgesic effects attained during this period are directly associated to the localized infiltration of local anesthetics and therefore cary significant diagnostic value as to the etiological location, or anatomical origin, of the pain. Expected duration of relief is directly dependent on the pharmacodynamics of the local anesthetic used. Long-acting (4-6 hours) anesthetics used.  Reported result: Relief during the next 4 to 6 hour after the procedure: 50 % (Short-Term Relief)            Interpretative annotation: Clinically appropriate result. Analgesia during this period is likely to be Local Anesthetic-related.          Long-term benefit: Defined as the period of time past the expected duration of local anesthetics (1 hour for short-acting and 4-6 hours for long-acting). With the possible exception of prolonged sympathetic blockade from the local anesthetics, benefits during this period are typically attributed to, or associated with, other factors such as analgesic sensory neuropraxia, antiinflammatory effects, or beneficial biochemical changes provided by agents other than the local anesthetics.  Reported result: Extended relief following procedure:30%(Long-Term Relief)            Interpretative annotation: Clinically possible results. Good relief. No permanent benefit expected. Inflammation plays a part in the etiology to the pain.          Current benefits: Defined as reported results that persistent at this point in time.   Analgesia: 25 %            Function: Somewhat improved ROM: Somewhat improved Interpretative annotation: Recurrence of symptoms. No permanent benefit expected. Results would suggest further  treatment needed.          Interpretation: Results would suggest that repeating the procedure may be necessary,                  Plan:  Please see "Plan of Care" for details.                Laboratory Chemistry  Inflammation Markers (CRP: Acute Phase) (ESR: Chronic Phase) No results found for: CRP, ESRSEDRATE, LATICACIDVEN                       Rheumatology Markers No results found for: RF, ANA, LABURIC, URICUR, LYMEIGGIGMAB, LYMEABIGMQN, HLAB27                      Renal Function Markers Lab Results  Component Value Date   BUN 14 10/27/2015   CREATININE 0.59 10/27/2015   GFRAA >60 10/27/2015   GFRNONAA >60 10/27/2015                             Hepatic Function Markers Lab Results  Component Value Date   AST 19 10/27/2015   ALT 18 10/27/2015   ALBUMIN 4.2 10/27/2015   ALKPHOS 50 10/27/2015  Electrolytes Lab Results  Component Value Date   NA 139 10/27/2015   K 3.5 10/27/2015   CL 107 10/27/2015   CALCIUM 9.0 10/27/2015                        Neuropathy Markers No results found for: VITAMINB12, FOLATE, HGBA1C, HIV                      Bone Pathology Markers No results found for: VD25OH, Gertie Baron, [id number], [id number], 25OHVITD1, 25OHVITD2, 25OHVITD3, TESTOFREE, TESTOSTERONE                       Coagulation Parameters Lab Results  Component Value Date   INR 0.9 07/22/2007   LABPROT 12.2 07/22/2007   PLT 236 10/27/2015                        Cardiovascular Markers Lab Results  Component Value Date   BNP 16 08/11/2013   TROPONINI <0.03 10/27/2015   HGB 13.6 10/27/2015   HCT 39.2 10/27/2015                         CA Markers No results found for: CEA, CA125, LABCA2                      Note: Lab results reviewed.  Recent Diagnostic Imaging Results  DG C-Arm 1-60 Min-No Report Fluoroscopy was utilized by the requesting physician.  No radiographic  interpretation.   Complexity Note: Imaging results reviewed. Results shared  with Ms. Macneill, using Layman's terms.                         Meds   Current Outpatient Medications:  .  alendronate (FOSAMAX) 70 MG tablet, Take 70 mg by mouth once a week. Take with a full glass of water on an empty stomach., Disp: , Rfl:  .  amLODipine (NORVASC) 5 MG tablet, Take 5 mg by mouth daily., Disp: , Rfl:  .  Cyanocobalamin (VITAMIN B-12 PO), Take by mouth daily., Disp: , Rfl:  .  hydrochlorothiazide (HYDRODIURIL) 25 MG tablet, Take 25 mg by mouth daily., Disp: , Rfl:  .  ibuprofen (ADVIL,MOTRIN) 200 MG tablet, Take 400 mg by mouth every 6 (six) hours as needed., Disp: , Rfl:  .  levothyroxine (SYNTHROID, LEVOTHROID) 112 MCG tablet, Take 125 mcg by mouth daily. , Disp: , Rfl:  .  meclizine (ANTIVERT) 25 MG tablet, Take 25 mg by mouth 3 (three) times daily as needed for dizziness., Disp: , Rfl:  .  Multiple Vitamins-Minerals (MULTIVITAMIN ADULT PO), Take by mouth daily., Disp: , Rfl:  .  pantoprazole (PROTONIX) 40 MG tablet, Take 40 mg by mouth daily., Disp: , Rfl:  .  rosuvastatin (CRESTOR) 10 MG tablet, Take 10 mg by mouth daily., Disp: , Rfl:  .  sitaGLIPtin-metformin (JANUMET) 50-500 MG tablet, Take 1 tablet by mouth 2 (two) times daily with a meal., Disp: , Rfl:  .  TURMERIC PO, Take by mouth daily., Disp: , Rfl:  .  ondansetron (ZOFRAN) 4 MG tablet, Take 1-2 tabs by mouth every 8 hours as needed for nausea/vomiting (Patient not taking: Reported on 11/10/2015), Disp: 30 tablet, Rfl: 0  ROS  Constitutional: Denies any fever or chills Gastrointestinal: No reported hemesis, hematochezia, vomiting, or acute GI distress Musculoskeletal: Denies any  acute onset joint swelling, redness, loss of ROM, or weakness Neurological: No reported episodes of acute onset apraxia, aphasia, dysarthria, agnosia, amnesia, paralysis, loss of coordination, or loss of consciousness  Allergies  Ms. Holtmeyer has No Known Allergies.  PFSH  Drug: Ms. Lindenbaum  reports that she does not use  drugs. Alcohol:  reports that she drinks about 0.6 oz of alcohol per week. Tobacco:  reports that she has never smoked. She has never used smokeless tobacco. Medical:  has a past medical history of Arthritis, Cancer ([place]), Diabetes mellitus type 2 in obese ([place]), GERD (gastroesophageal reflux disease), Headache, HLD (hyperlipidemia), HTN (hypertension), Hypothyroidism, Localized swelling of both lower legs, Neck pain, Numbness and tingling, Pulmonary embolus ([place]), and Vertigo. Surgical: Ms. Bouse  has a past surgical history that includes Thyroidectomy; c-sections; Colonoscopy (N/A, 11/10/2015); Esophagogastroduodenoscopy (N/A, 11/10/2015); and Cataract extraction w/PHACO (Left, 08/27/2017). Family: family history includes Breast cancer (age of onset: 2) in her maternal aunt; Breast cancer (age of onset: 21) in her paternal aunt; Diabetes in her father; Liver cancer in her father; Lung cancer in her father; Stroke in her other.  Constitutional Exam  General appearance: Well nourished, well developed, and well hydrated. In no apparent acute distress Vitals:   02/05/18 1211  BP: (!) 147/78  Pulse: 75  Resp: 18  Temp: 98.3 F (36.8 C)  TempSrc: Oral  SpO2: 97%  Weight: 196 lb (88.9 kg)  Height: 5' 7" (1.702 m)   BMI Assessment: Estimated body mass index is 30.7 kg/m as calculated from the following:   Height as of this encounter: 5' 7" (1.702 m).   Weight as of this encounter: 196 lb (88.9 kg).  BMI interpretation table: BMI level Category Range association with higher incidence of chronic pain  <18 kg/m2 Underweight   18.5-24.9 kg/m2 Ideal body weight   25-29.9 kg/m2 Overweight Increased incidence by 20%  30-34.9 kg/m2 Obese (Class I) Increased incidence by 68%  35-39.9 kg/m2 Severe obesity (Class II) Increased incidence by 136%  >40 kg/m2 Extreme obesity (Class III) Increased incidence by 254%   Patient's current BMI Ideal Body weight  Body mass index is 30.7 kg/m. Ideal body  weight: 61.6 kg (135 lb 12.9 oz) Adjusted ideal body weight: 72.5 kg (159 lb 14.1 oz)   BMI Readings from Last 4 Encounters:  02/05/18 30.70 kg/m  01/14/18 31.00 kg/m  01/08/18 31.64 kg/m  08/27/17 29.91 kg/m   Wt Readings from Last 4 Encounters:  02/05/18 196 lb (88.9 kg)  01/14/18 195 lb (88.5 kg)  01/08/18 196 lb (88.9 kg)  08/27/17 191 lb (86.6 kg)  Psych/Mental status: Alert, oriented x 3 (person, place, & time)       Eyes: PERLA Respiratory: No evidence of acute respiratory distress  Cervical Spine Area Exam  Skin & Axial Inspection: No masses, redness, edema, swelling, or associated skin lesions Alignment: Symmetrical Functional ROM: Unrestricted ROM      Stability: No instability detected Muscle Tone/Strength: Functionally intact. No obvious neuro-muscular anomalies detected. Sensory (Neurological): Unimpaired Palpation: No palpable anomalies              Upper Extremity (UE) Exam    Side: Right upper extremity  Side: Left upper extremity  Skin & Extremity Inspection: Skin color, temperature, and hair growth are WNL. No peripheral edema or cyanosis. No masses, redness, swelling, asymmetry, or associated skin lesions. No contractures.  Skin & Extremity Inspection: Skin color, temperature, and hair growth are WNL. No peripheral edema or cyanosis. No masses, redness,  swelling, asymmetry, or associated skin lesions. No contractures.  Functional ROM: Unrestricted ROM          Functional ROM: Unrestricted ROM          Muscle Tone/Strength: Functionally intact. No obvious neuro-muscular anomalies detected.  Muscle Tone/Strength: Functionally intact. No obvious neuro-muscular anomalies detected.  Sensory (Neurological): Unimpaired          Sensory (Neurological): Unimpaired          Palpation: No palpable anomalies              Palpation: No palpable anomalies              Provocative Test(s):  Phalen's test: deferred Tinel's test: deferred Apley's scratch test (touch  opposite shoulder):  Action 1 (Across chest): deferred Action 2 (Overhead): deferred Action 3 (LB reach): deferred   Provocative Test(s):  Phalen's test: deferred Tinel's test: deferred Apley's scratch test (touch opposite shoulder):  Action 1 (Across chest): deferred Action 2 (Overhead): deferred Action 3 (LB reach): deferred    Thoracic Spine Area Exam  Skin & Axial Inspection: No masses, redness, or swelling Alignment: Symmetrical Functional ROM: Unrestricted ROM Stability: No instability detected Muscle Tone/Strength: Functionally intact. No obvious neuro-muscular anomalies detected. Sensory (Neurological): Unimpaired Muscle strength & Tone: No palpable anomalies  Lumbar Spine Area Exam  Skin & Axial Inspection: No masses, redness, or swelling Alignment: Symmetrical Functional ROM: Unrestricted ROM       Stability: No instability detected Muscle Tone/Strength: Functionally intact. No obvious neuro-muscular anomalies detected. Sensory (Neurological): Unimpaired Palpation: No palpable anomalies       Provocative Tests: Hyperextension/rotation test: deferred today       Lumbar quadrant test (Kemp's test): deferred today       Lateral bending test: deferred today       Patrick's Maneuver: deferred today                   FABER test: deferred today                   S-I anterior distraction/compression test: deferred today         S-I lateral compression test: deferred today         S-I Thigh-thrust test: deferred today         S-I Gaenslen's test: deferred today           Gait & Posture Assessment  Ambulation: Unassisted Gait: Relatively normal for age and body habitus Posture: WNL   Lower Extremity Exam    Side: Right lower extremity  Side: Left lower extremity  Stability: No instability observed          Stability: No instability observed          Skin & Extremity Inspection: Skin color, temperature, and hair growth are WNL. No peripheral edema or cyanosis. No  masses, redness, swelling, asymmetry, or associated skin lesions. No contractures.  Skin & Extremity Inspection: Skin color, temperature, and hair growth are WNL. No peripheral edema or cyanosis. No masses, redness, swelling, asymmetry, or associated skin lesions. No contractures.  Functional ROM: Unrestricted ROM                  Functional ROM: Unrestricted ROM                  Muscle Tone/Strength: Functionally intact. No obvious neuro-muscular anomalies detected.  Muscle Tone/Strength: Functionally intact. No obvious neuro-muscular anomalies detected.  Sensory (Neurological): Unimpaired  Sensory (Neurological): Arthropathic  arthralgia  Palpation: No palpable anomalies  Palpation: Complains of area being tender to palpation   Assessment  Primary Diagnosis & Pertinent Problem List: The primary encounter diagnosis was Primary osteoarthritis of left knee. Diagnoses of Chronic pain of left knee and Chronic pain syndrome were also pertinent to this visit.  Status Diagnosis  Persistent Persistent Persistent 1. Primary osteoarthritis of left knee   2. Chronic pain of left knee   3. Chronic pain syndrome     General Recommendations: The pain condition that the patient suffers from is best treated with a multidisciplinary approach that involves an increase in physical activity to prevent de-conditioning and worsening of the pain cycle, as well as psychological counseling (formal and/or informal) to address the co-morbid psychological affects of pain. Treatment will often involve judicious use of pain medications and interventional procedures to decrease the pain, allowing the patient to participate in the physical activity that will ultimately produce long-lasting pain reductions. The goal of the multidisciplinary approach is to return the patient to a higher level of overall function and to restore their ability to perform activities of daily living.   62 year old female with a history of left knee  osteoarthritis status post left knee genicular nerve block follows up for postprocedural evaluation.  Patient notes moderate pain relief in regards to her left knee pain for approximately 7 days after the procedure.  She also notes improved ambulation with less pain for approximately 7 days after the block.  We discussed repeating genicular nerve block #2 with possible radiofrequency ablation thereafter.  Risks and benefits were discussed.  Patient like to proceed.  Plan: -Repeat left genicular nerve block #2 without sedation   Lab-work, procedure(s), and/or referral(s): Orders Placed This Encounter  Procedures  . GENICULAR NERVE BLOCK   Time Note: Greater than 50% of the 25 minute(s) of face-to-face time spent with Ms. Saia, was spent in counseling/coordination of care regarding: Ms. Maulding primary cause of pain, the treatment plan, the risks and possible complications of proposed treatment, going over the informed consent and the results, interpretation and significance of  her recent diagnostic interventional treatment(s).  Provider-requested follow-up: Return in about 1 week (around 02/12/2018) for Procedure.  No future appointments.  Primary Care Physician: Alisha Pink, MD Location: [facility] Out[facility] Management Facility Note by: Alisha Sanchez, M.D Date: 02/05/2018; Time: 12:49 PM  There are no Patient Instructions on file for this visit.

## 2018-02-13 ENCOUNTER — Ambulatory Visit: Payer: BC Managed Care – PPO | Admitting: Student in an Organized Health Care Education/Training Program

## 2018-02-13 ENCOUNTER — Ambulatory Visit (HOSPITAL_BASED_OUTPATIENT_CLINIC_OR_DEPARTMENT_OTHER): Payer: BC Managed Care – PPO | Admitting: Student in an Organized Health Care Education/Training Program

## 2018-02-13 ENCOUNTER — Other Ambulatory Visit: Payer: Self-pay

## 2018-02-13 ENCOUNTER — Ambulatory Visit
Admission: RE | Admit: 2018-02-13 | Discharge: 2018-02-13 | Disposition: A | Discharge status: Home / Self Care | Payer: BC Managed Care – PPO | Source: Ambulatory Visit | Attending: Student in an Organized Health Care Education/Training Program | Admitting: Student in an Organized Health Care Education/Training Program

## 2018-02-13 ENCOUNTER — Encounter: Payer: Self-pay | Admitting: Student in an Organized Health Care Education/Training Program

## 2018-02-13 VITALS — BP 155/73 | HR 73 | Temp 98.9°F | Resp 15 | Ht 67.0 in | Wt 196.0 lb

## 2018-02-13 DIAGNOSIS — G894 Chronic pain syndrome: Secondary | ICD-10-CM | POA: Diagnosis present

## 2018-02-13 DIAGNOSIS — G8929 Other chronic pain: Secondary | ICD-10-CM

## 2018-02-13 DIAGNOSIS — M1712 Unilateral primary osteoarthritis, left knee: Secondary | ICD-10-CM | POA: Diagnosis not present

## 2018-02-13 DIAGNOSIS — M25562 Pain in left knee: Secondary | ICD-10-CM

## 2018-02-13 MED ORDER — DEXAMETHASONE SODIUM PHOSPHATE 10 MG/ML IJ SOLN
INTRAMUSCULAR | Status: AC
  Filled 2018-02-13: qty 1

## 2018-02-13 MED ORDER — LIDOCAINE HCL 2 % IJ SOLN
INTRAMUSCULAR | Status: AC
  Filled 2018-02-13: qty 20

## 2018-02-13 MED ORDER — LIDOCAINE HCL 2 % IJ SOLN
20.0000 mL | Freq: Once | INTRAMUSCULAR | Status: AC
  Administered 2018-02-13: 400 mg

## 2018-02-13 MED ORDER — ROPIVACAINE HCL 2 MG/ML IJ SOLN
10.0000 mL | Freq: Once | INTRAMUSCULAR | Status: AC
  Administered 2018-02-13: 10 mL

## 2018-02-13 MED ORDER — ROPIVACAINE HCL 2 MG/ML IJ SOLN
INTRAMUSCULAR | Status: AC
  Filled 2018-02-13: qty 10

## 2018-02-13 MED ORDER — DEXAMETHASONE SODIUM PHOSPHATE 10 MG/ML IJ SOLN
10.0000 mg | Freq: Once | INTRAMUSCULAR | Status: AC
  Administered 2018-02-13: 10 mg

## 2018-02-13 NOTE — Patient Instructions (Signed)

## 2018-02-13 NOTE — Progress Notes (Signed)
Safety precautions to be maintained throughout the outpatient stay will include: orient to surroundings, keep bed in low position, maintain call bell within reach at all times, provide assistance with transfer out of bed and ambulation.  

## 2018-02-13 NOTE — Progress Notes (Signed)
Patient's Name: Alisha Sanchez  MRN: [id number]  Referring Provider: [name], MD  DOB: Sep 24, 1955  PCP: [name], MD  DOS: 02/13/2018  Note by: Gillis Santa, MD  Service setting: Ambulatory outpatient  Specialty: Interventional Pain Management  Patient type: Established  Location: [facility] (AMB) Pain Management Facility  Visit type: Interventional Procedure   Primary Reason for Visit: Interventional Pain Management Treatment. CC: Follow-up (GNB #2)  Procedure:          Anesthesia, Analgesia, Anxiolysis:  Type: Genicular Nerves Block (Superior-lateral, Superior-medial, and Inferior-medial Genicular Nerves) #2  CPT: 44967      Primary Purpose: Diagnostic Region: Lateral, Anterior, and Medial aspects of the knee joint, above and below the knee joint proper. Level: Superior and inferior to the knee joint. Target Area: For Genicular Nerve block(s), the targets are: the superior-lateral genicular nerve, located in the lateral distal portion of the femoral shaft as it curves to form the lateral epicondyle, in the region of the distal femoral metaphysis; the superior-medial genicular nerve, located in the medial distal portion of the femoral shaft as it curves to form the medial epicondyle; and the inferior-medial genicular nerve, located in the medial, proximal portion of the tibial shaft, as it curves to form the medial epicondyle, in the region of the proximal tibial metaphysis. Approach: Anterior, percutaneous, ipsilateral approach. Laterality: Left knee Position: Modified Fowler's position with pillows under the targeted knee(s).  Type: Local Anesthesia Indication(s): Analgesia         Route: Infiltration (Millen/IM) IV Access: Declined Sedation: Declined  Local Anesthetic: Lidocaine 1%   Indications: 1. Primary osteoarthritis of left knee   2. Chronic pain of left knee   3. Chronic pain syndrome    Pain Score: Pre-procedure: 5 /10 Post-procedure: 0-No pain/10  Pre-op  Assessment:  Alisha Sanchez is a 62 y.o. (year old), female patient, seen today for interventional treatment. She  has a past surgical history that includes Thyroidectomy; c-sections; Colonoscopy (N/A, 11/10/2015); Esophagogastroduodenoscopy (N/A, 11/10/2015); and Cataract extraction w/PHACO (Left, 08/27/2017). Alisha Sanchez has a current medication list which includes the following prescription(s): alendronate, amlodipine, cyanocobalamin, hydrochlorothiazide, ibuprofen, levothyroxine, meclizine, multiple vitamins-minerals, pantoprazole, rosuvastatin, sitagliptin-metformin, turmeric, and ondansetron. Her primarily concern today is the Follow-up (GNB #2)  Initial Vital Signs:  Pulse/HCG Rate: 73ECG Heart Rate: 75 Temp: 98.9 F (37.2 C) Resp: 16 BP: 131/70 SpO2: 99 %  BMI: Estimated body mass index is 30.7 kg/m as calculated from the following:   Height as of this encounter: 5\' 7"  (1.702 m).   Weight as of this encounter: 196 lb (88.9 kg).  Risk Assessment: Allergies: Reviewed. She has No Known Allergies.  Allergy Precautions: None required Coagulopathies: Reviewed. None identified.  Blood-thinner therapy: None at this time Active Infection(s): Reviewed. None identified. Alisha Sanchez is afebrile  Site Confirmation: Alisha Sanchez was asked to confirm the procedure and laterality before marking the site Procedure checklist: Completed Consent: Before the procedure and under the influence of no sedative(s), amnesic(s), or anxiolytics, the patient was informed of the treatment options, risks and possible complications. To fulfill our ethical and legal obligations, as recommended by the American Medical Association's Code of Ethics, I have informed the patient of my clinical impression; the nature and purpose of the treatment or procedure; the risks, benefits, and possible complications of the intervention; the alternatives, including doing nothing; the risk(s) and benefit(s) of the alternative  treatment(s) or procedure(s); and the risk(s) and benefit(s) of doing nothing. The patient was provided information about the general risks  and possible complications associated with the procedure. These may include, but are not limited to: failure to achieve desired goals, infection, bleeding, organ or nerve damage, allergic reactions, paralysis, and death. In addition, the patient was informed of those risks and complications associated to the procedure, such as failure to decrease pain; infection; bleeding; organ or nerve damage with subsequent damage to sensory, motor, and/or autonomic systems, resulting in permanent pain, numbness, and/or weakness of one or several areas of the body; allergic reactions; (i.e.: anaphylactic reaction); and/or death. Furthermore, the patient was informed of those risks and complications associated with the medications. These include, but are not limited to: allergic reactions (i.e.: anaphylactic or anaphylactoid reaction(s)); adrenal axis suppression; blood sugar elevation that in diabetics may result in ketoacidosis or comma; water retention that in patients with history of congestive heart failure may result in shortness of breath, pulmonary edema, and decompensation with resultant heart failure; weight gain; swelling or edema; medication-induced neural toxicity; particulate matter embolism and blood vessel occlusion with resultant organ, and/or nervous system infarction; and/or aseptic necrosis of one or more joints. Finally, the patient was informed that Medicine is not an exact science; therefore, there is also the possibility of unforeseen or unpredictable risks and/or possible complications that may result in a catastrophic outcome. The patient indicated having understood very clearly. We have given the patient no guarantees and we have made no promises. Enough time was given to the patient to ask questions, all of which were answered to the patient's satisfaction. Ms.  Sanchez has indicated that she wanted to continue with the procedure. Attestation: I, the ordering provider, attest that I have discussed with the patient the benefits, risks, side-effects, alternatives, likelihood of achieving goals, and potential problems during recovery for the procedure that I have provided informed consent. Date  Time: 02/13/2018  8:43 AM  Pre-Procedure Preparation:  Monitoring: As per clinic protocol. Respiration, ETCO2, SpO2, BP, heart rate and rhythm monitor placed and checked for adequate function Safety Precautions: Patient was assessed for positional comfort and pressure points before starting the procedure. Time-out: I initiated and conducted the "Time-out" before starting the procedure, as per protocol. The patient was asked to participate by confirming the accuracy of the "Time Out" information. Verification of the correct person, site, and procedure were performed and confirmed by me, the nursing staff, and the patient. "Time-out" conducted as per Joint Commission's Universal Protocol (UP.01.01.01). Time: 0934  Description of Procedure:          Area Prepped: Entire knee area, from mid-thigh to mid-shin, lateral, anterior, and medial aspects. Prepping solution: ChloraPrep (2% chlorhexidine gluconate and 70% isopropyl alcohol) Safety Precautions: Aspiration looking for blood return was conducted prior to all injections. At no point did we inject any substances, as a needle was being advanced. No attempts were made at seeking any paresthesias. Safe injection practices and needle disposal techniques used. Medications properly checked for expiration dates. SDV (single dose vial) medications used. Latex Allergy precautions taken.   Description of the Procedure: Protocol guidelines were followed. The patient was placed in position over the procedure table. The target area was identified and the area prepped in the usual manner. Skin & deeper tissues infiltrated with local  anesthetic. Appropriate amount of time allowed to pass for local anesthetics to take effect. The procedure needles were then advanced to the target area. Proper needle placement secured. Negative aspiration confirmed. Solution injected in intermittent fashion, asking for systemic symptoms every 0.5cc of injectate. The needles were then removed  and the area cleansed, making sure to leave some of the prepping solution back to take advantage of its long term bactericidal properties.  Vitals:   02/13/18 0930 02/13/18 0935 02/13/18 0940 02/13/18 0944  BP: 130/80 135/78 135/79 (!) 155/73  Pulse:      Resp: 10 14 16 15   Temp:      TempSrc:      SpO2: 96% 97% 97% 98%  Weight:      Height:        Start Time: 0934 hrs. End Time: 0942 hrs. Materials:  Needle(s) Type: Regular needle Gauge: 22G Length: 3.5-in Medication(s): Please see orders for medications and dosing details. 5 cc solution made of 4 cc of 0.2% ropivacaine, 1 cc of Decadron 10 mg/cc.  1.5 cc injected at each level above. Imaging Guidance (Non-Spinal):          Type of Imaging Technique: Fluoroscopy Guidance (Non-Spinal) Indication(s): Assistance in needle guidance and placement for procedures requiring needle placement in or near specific anatomical locations not easily accessible without such assistance. Exposure Time: Please see nurses notes. Contrast: None used. Fluoroscopic Guidance: I was personally present during the use of fluoroscopy. "Tunnel Vision Technique" used to obtain the best possible view of the target area. Parallax error corrected before commencing the procedure. "Direction-depth-direction" technique used to introduce the needle under continuous pulsed fluoroscopy. Once target was reached, antero-posterior, oblique, and lateral fluoroscopic projection used confirm needle placement in all planes. Images permanently stored in EMR. Interpretation: No contrast injected.  Antibiotic Prophylaxis:   Anti-infectives  (From admission, onward)   None     Indication(s): None identified  Post-operative Assessment:  Post-procedure Vital Signs:  Pulse/HCG Rate: 7378 Temp: 98.9 F (37.2 C) Resp: 15 BP: (!) 155/73 SpO2: 98 %  EBL: None  Complications: No immediate post-treatment complications observed by team, or reported by patient.  Note: The patient tolerated the entire procedure well. A repeat set of vitals were taken after the procedure and the patient was kept under observation following institutional policy, for this type of procedure. Post-procedural neurological assessment was performed, showing return to baseline, prior to discharge. The patient was provided with post-procedure discharge instructions, including a section on how to identify potential problems. Should any problems arise concerning this procedure, the patient was given instructions to immediately contact us, at any time, without hesitation. In any case, we plan to contact the patient by telephone for a follow-up status report regarding this interventional procedure.  Comments:  No additional relevant information. 5 out of 5 strength bilateral lower extremity: Plantar flexion, dorsiflexion, knee flexion, knee extension.  Plan of Care   Imaging Orders     DG C-Arm 1-60 Min-No Report Procedure Orders    No procedure(s) ordered today    Medications ordered for procedure: Meds ordered this encounter  Medications  . lidocaine (XYLOCAINE) 2 % (with pres) injection 400 mg  . ropivacaine (PF) 2 mg/mL (0.2%) (NAROPIN) injection 10 mL  . dexamethasone (DECADRON) injection 10 mg   Medications administered: We administered lidocaine, ropivacaine (PF) 2 mg/mL (0.2%), and dexamethasone.  See the medical record for exact dosing, route, and time of administration.  New Prescriptions   No medications on file   Disposition: Discharge home  Discharge Date & Time: 02/13/2018; 0947 hrs.   Physician-requested Follow-up: Return in about 27  days (around 03/12/2018) for Post Procedure Evaluation.  Future Appointments  Date Time Provider [place]  03/14/2018  8:30 AM Gillis Santa, MD [facility] None   Primary  Care Physician: [name], MD Location: [facility] Note by: Gillis Santa, MD Date: 02/13/2018; Time: 10:01 AM  Disclaimer:  Medicine is not an exact science. The only guarantee in medicine is that nothing is guaranteed. It is important to note that the decision to proceed with this intervention was based on the information collected from the patient. The Data and conclusions were drawn from the patient's questionnaire, the interview, and the physical examination. Because the information was provided in large part by the patient, it cannot be guaranteed that it has not been purposely or unconsciously manipulated. Every effort has been made to obtain as much relevant data as possible for this evaluation. It is important to note that the conclusions that lead to this procedure are derived in large part from the available data. Always take into account that the treatment will also be dependent on availability of resources and existing treatment guidelines, considered by other Pain Management Practitioners as being common knowledge and practice, at the time of the intervention. For Medico-Legal purposes, it is also important to point out that variation in procedural techniques and pharmacological choices are the acceptable norm. The indications, contraindications, technique, and results of the above procedure should only be interpreted and judged by a Board-Certified Interventional Pain Specialist with extensive familiarity and expertise in the same exact procedure and technique.

## 2018-02-14 ENCOUNTER — Telehealth: Payer: Self-pay | Admitting: *Deleted

## 2018-02-14 NOTE — Telephone Encounter (Signed)
Spoke with patient re; procedure on yesterday. Denies any questions or concerns.  

## 2018-03-14 ENCOUNTER — Ambulatory Visit
Payer: BC Managed Care – PPO | Attending: Student in an Organized Health Care Education/Training Program | Admitting: Student in an Organized Health Care Education/Training Program

## 2018-03-14 ENCOUNTER — Encounter: Payer: Self-pay | Admitting: Student in an Organized Health Care Education/Training Program

## 2018-03-14 ENCOUNTER — Other Ambulatory Visit: Payer: Self-pay

## 2018-03-14 VITALS — BP 140/80 | HR 74 | Temp 98.7°F | Resp 16 | Ht 67.0 in | Wt 195.0 lb

## 2018-03-14 DIAGNOSIS — M1712 Unilateral primary osteoarthritis, left knee: Secondary | ICD-10-CM | POA: Insufficient documentation

## 2018-03-14 DIAGNOSIS — Z6830 Body mass index (BMI) 30.0-30.9, adult: Secondary | ICD-10-CM | POA: Diagnosis not present

## 2018-03-14 DIAGNOSIS — G894 Chronic pain syndrome: Secondary | ICD-10-CM | POA: Diagnosis not present

## 2018-03-14 DIAGNOSIS — Z79899 Other long term (current) drug therapy: Secondary | ICD-10-CM | POA: Diagnosis not present

## 2018-03-14 DIAGNOSIS — K219 Gastro-esophageal reflux disease without esophagitis: Secondary | ICD-10-CM | POA: Diagnosis not present

## 2018-03-14 DIAGNOSIS — R5383 Other fatigue: Secondary | ICD-10-CM | POA: Insufficient documentation

## 2018-03-14 DIAGNOSIS — E119 Type 2 diabetes mellitus without complications: Secondary | ICD-10-CM | POA: Diagnosis not present

## 2018-03-14 DIAGNOSIS — E669 Obesity, unspecified: Secondary | ICD-10-CM | POA: Insufficient documentation

## 2018-03-14 DIAGNOSIS — M1990 Unspecified osteoarthritis, unspecified site: Secondary | ICD-10-CM | POA: Diagnosis not present

## 2018-03-14 DIAGNOSIS — Z79890 Hormone replacement therapy (postmenopausal): Secondary | ICD-10-CM | POA: Insufficient documentation

## 2018-03-14 DIAGNOSIS — G8929 Other chronic pain: Secondary | ICD-10-CM

## 2018-03-14 DIAGNOSIS — Z86711 Personal history of pulmonary embolism: Secondary | ICD-10-CM | POA: Insufficient documentation

## 2018-03-14 DIAGNOSIS — I10 Essential (primary) hypertension: Secondary | ICD-10-CM | POA: Insufficient documentation

## 2018-03-14 DIAGNOSIS — E039 Hypothyroidism, unspecified: Secondary | ICD-10-CM | POA: Insufficient documentation

## 2018-03-14 DIAGNOSIS — E785 Hyperlipidemia, unspecified: Secondary | ICD-10-CM | POA: Insufficient documentation

## 2018-03-14 DIAGNOSIS — M25562 Pain in left knee: Secondary | ICD-10-CM

## 2018-03-14 NOTE — Progress Notes (Signed)
Patient's Name: Alisha Sanchez  MRN: [id number]  Referring Provider: [name], MD  DOB: 06/30/1956  PCP: [name], MD  DOS: 03/14/2018  Note by: Gillis Santa, MD  Service setting: Ambulatory outpatient  Specialty: Interventional Pain Management  Location: [facility] (AMB) Pain Management Facility    Patient type: Established   Primary Reason(s) for Visit: Encounter for post-procedure evaluation of chronic illness with mild to moderate exacerbation CC: Knee Pain (left)  HPI  Alisha Sanchez is a 62 y.o. year old, female patient, who comes today for a post-procedure evaluation. 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 Her primarily concern today is the Knee Pain (left)  Pain Assessment: Location: Left Knee Radiating: "can radiate from front of knee to back. Today pain is just front of left knee" Onset: More than a month ago Duration: Chronic pain Quality: Constant, Dull, Sharp Severity: 8 /10 (subjective, self-reported pain score)  Note: Reported level is compatible with observation.                         When using our objective Pain Scale, levels between 6 and 10/10 are said to belong in an emergency room, as it progressively worsens from a 6/10, described as severely limiting, requiring emergency care not usually available at an outpatient pain management facility. At a 6/10 level, communication becomes difficult and requires great effort. Assistance to reach the emergency department may be required. Facial flushing and profuse sweating along with potentially dangerous increases in heart rate and blood pressure will be evident. Effect on ADL: difficult to perform daily activities Timing: Constant Modifying factors: ibuprofen, essential oils, procedures BP: 140/80  HR: 74  Alisha Sanchez comes in today for  post-procedure evaluation after the treatment done on 02/14/2018.  Further details on both, my assessment(s), as well as the proposed treatment plan, please see below.  Post-Procedure Assessment  02/13/2018 Procedure: Left knee genicular nerve block Pre-procedure pain score:  5/10 Post-procedure pain score: 0/10         Influential Factors: BMI: 30.54 kg/m Intra-procedural challenges: None observed.         Assessment challenges: None detected.              Reported side-effects: None.        Post-procedural adverse reactions or complications: None reported         Sedation: Please see nurses note. When no sedatives are used, the analgesic levels obtained are directly associated to the effectiveness of the local anesthetics. However, when sedation is provided, the level of analgesia obtained during the initial 1 hour following the intervention, is believed to be the result of a combination of factors. These factors may include, but are not limited to: 1. The effectiveness of the local anesthetics used. 2. The effects of the analgesic(s) and/or anxiolytic(s) used. 3. The degree of discomfort experienced by the patient at the time of the procedure. 4. The patients ability and reliability in recalling and recording the events. 5. The presence and influence of possible secondary gains and/or psychosocial factors. Reported result: Relief experienced during the 1st hour after the procedure: 100 % (Ultra-Short Term Relief)            Interpretative annotation: Clinically appropriate result. Analgesia during this period is likely to be Local Anesthetic and/or IV Sedative (Analgesic/Anxiolytic) related.  Effects of local anesthetic: The analgesic effects attained during this period are directly associated to the localized infiltration of local anesthetics and therefore cary significant diagnostic value as to the etiological location, or anatomical origin, of the pain. Expected duration of  relief is directly dependent on the pharmacodynamics of the local anesthetic used. Long-acting (4-6 hours) anesthetics used.  Reported result: Relief during the next 4 to 6 hour after the procedure: 80 % (Short-Term Relief)            Interpretative annotation: Clinically appropriate result. Analgesia during this period is likely to be Local Anesthetic-related.          Long-term benefit: Defined as the period of time past the expected duration of local anesthetics (1 hour for short-acting and 4-6 hours for long-acting). With the possible exception of prolonged sympathetic blockade from the local anesthetics, benefits during this period are typically attributed to, or associated with, other factors such as analgesic sensory neuropraxia, antiinflammatory effects, or beneficial biochemical changes provided by agents other than the local anesthetics.  Reported result: Extended relief following procedure: 20 %(80% relief lasted for 1 week; pt states she has been on her feet a lot more than usual in the past week) (Long-Term Relief)            Interpretative annotation: Clinically possible results. Good relief. No permanent benefit expected. Inflammation plays a part in the etiology to the pain.          Current benefits: Defined as reported results that persistent at this point in time.   Analgesia: 25 %            Function: Somewhat improved ROM: Somewhat improved Interpretative annotation: Recurrence of symptoms. No permanent benefit expected. Effective diagnostic intervention.          Interpretation: Results would suggest a successful diagnostic intervention.                  Plan:  Proceed with Radiofrequency Ablation for the purpose of attaining long-term benefits.                Laboratory Chemistry  Inflammation Markers (CRP: Acute Phase) (ESR: Chronic Phase) No results found for: CRP, ESRSEDRATE, LATICACIDVEN                       Rheumatology Markers No results found for: RF, ANA,  LABURIC, URICUR, LYMEIGGIGMAB, LYMEABIGMQN, HLAB27                      Renal Function Markers Lab Results  Component Value Date   BUN 14 10/27/2015   CREATININE 0.59 10/27/2015   GFRAA >60 10/27/2015   GFRNONAA >60 10/27/2015                             Hepatic Function Markers Lab Results  Component Value Date   AST 19 10/27/2015   ALT 18 10/27/2015   ALBUMIN 4.2 10/27/2015   ALKPHOS 50 10/27/2015                        Electrolytes Lab Results  Component Value Date   NA 139 10/27/2015   K 3.5 10/27/2015   CL 107 10/27/2015   CALCIUM 9.0 10/27/2015                        Neuropathy Markers No  results found for: VITAMINB12, FOLATE, HGBA1C, HIV                      CNS Tests No results found for: SDES, GRAMSTAIN, CULT, COLORCSF, APPEARCSF, RBCCOUNTCSF, WBCCSF, POLYSCSF, LYMPHSCSF, EOSCSF, PROTEINCSF, GLUCCSF, JCVIRUS, CSFOLI, IGGCSF, IGGALB, IGGIND                      Bone Pathology Markers No results found for: VD25OH, [id number], [id number], [id number], 25OHVITD1, 25OHVITD2, 25OHVITD3, TESTOFREE, TESTOSTERONE                       Coagulation Parameters Lab Results  Component Value Date   INR 0.9 07/22/2007   LABPROT 12.2 07/22/2007   PLT 236 10/27/2015                        Cardiovascular Markers Lab Results  Component Value Date   BNP 16 08/11/2013   TROPONINI <0.03 10/27/2015   HGB 13.6 10/27/2015   HCT 39.2 10/27/2015                         CA Markers No results found for: CEA, CA125, LABCA2                      Note: Lab results reviewed.  Recent Diagnostic Imaging Results  DG C-Arm 1-60 Min-No Report Fluoroscopy was utilized by the requesting physician.  No radiographic  interpretation.   Complexity Note: Imaging results reviewed. Results shared with Ms. Podoll, using Layman's terms.                         Meds   Current Outpatient Medications:  .  alendronate (FOSAMAX) 70 MG tablet, Take 70 mg by mouth once a week. Take with a  full glass of water on an empty stomach., Disp: , Rfl:  .  amLODipine (NORVASC) 5 MG tablet, Take 5 mg by mouth daily., Disp: , Rfl:  .  Cyanocobalamin (VITAMIN B-12 PO), Take by mouth daily., Disp: , Rfl:  .  hydrochlorothiazide (HYDRODIURIL) 25 MG tablet, Take 25 mg by mouth daily., Disp: , Rfl:  .  ibuprofen (ADVIL,MOTRIN) 200 MG tablet, Take 400 mg by mouth every 6 (six) hours as needed., Disp: , Rfl:  .  levothyroxine (SYNTHROID, LEVOTHROID) 112 MCG tablet, Take 125 mcg by mouth daily. , Disp: , Rfl:  .  meclizine (ANTIVERT) 25 MG tablet, Take 25 mg by mouth 3 (three) times daily as needed for dizziness., Disp: , Rfl:  .  Multiple Vitamins-Minerals (MULTIVITAMIN ADULT PO), Take by mouth daily., Disp: , Rfl:  .  pantoprazole (PROTONIX) 40 MG tablet, Take 40 mg by mouth daily., Disp: , Rfl:  .  rosuvastatin (CRESTOR) 10 MG tablet, Take 10 mg by mouth daily., Disp: , Rfl:  .  sitaGLIPtin-metformin (JANUMET) 50-500 MG tablet, Take 1 tablet by mouth 2 (two) times daily with a meal., Disp: , Rfl:  .  TURMERIC PO, Take by mouth daily., Disp: , Rfl:  .  ondansetron (ZOFRAN) 4 MG tablet, Take 1-2 tabs by mouth every 8 hours as needed for nausea/vomiting (Patient not taking: Reported on 02/13/2018), Disp: 30 tablet, Rfl: 0  ROS  Constitutional: Denies any fever or chills Gastrointestinal: No reported hemesis, hematochezia, vomiting, or acute GI distress Musculoskeletal: Denies any acute onset joint swelling, redness, loss of ROM, or  weakness Neurological: No reported episodes of acute onset apraxia, aphasia, dysarthria, agnosia, amnesia, paralysis, loss of coordination, or loss of consciousness  Allergies  Ms. Isley has No Known Allergies.  PFSH  Drug: Ms. Fader  reports that she does not use drugs. Alcohol:  reports that she drinks about 1.0 standard drinks of alcohol per week. Tobacco:  reports that she has never smoked. She has never used smokeless tobacco. Medical:  has a past  medical history of Arthritis, Cancer ([place]), Diabetes mellitus type 2 in obese ([place]), GERD (gastroesophageal reflux disease), Headache, HLD (hyperlipidemia), HTN (hypertension), Hypothyroidism, Localized swelling of both lower legs, Neck pain, Numbness and tingling, Pulmonary embolus ([place]), and Vertigo. Surgical: Ms. Nolasco  has a past surgical history that includes Thyroidectomy; c-sections; Colonoscopy (N/A, 11/10/2015); Esophagogastroduodenoscopy (N/A, 11/10/2015); and Cataract extraction w/PHACO (Left, 08/27/2017). Family: family history includes Breast cancer (age of onset: 36) in her maternal aunt; Breast cancer (age of onset: 37) in her paternal aunt; Diabetes in her father; Liver cancer in her father; Lung cancer in her father; Stroke in her other.  Constitutional Exam  General appearance: Well nourished, well developed, and well hydrated. In no apparent acute distress Vitals:   03/14/18 0853  BP: 140/80  Pulse: 74  Resp: 16  Temp: 98.7 F (37.1 C)  TempSrc: Oral  SpO2: 99%  Weight: 195 lb (88.5 kg)  Height: '5\' 7"'$  (1.702 m)   BMI Assessment: Estimated body mass index is 30.54 kg/m as calculated from the following:   Height as of this encounter: '5\' 7"'$  (1.702 m).   Weight as of this encounter: 195 lb (88.5 kg).  BMI interpretation table: BMI level Category Range association with higher incidence of chronic pain  <18 kg/m2 Underweight   18.5-24.9 kg/m2 Ideal body weight   25-29.9 kg/m2 Overweight Increased incidence by 20%  30-34.9 kg/m2 Obese (Class I) Increased incidence by 68%  35-39.9 kg/m2 Severe obesity (Class II) Increased incidence by 136%  >40 kg/m2 Extreme obesity (Class III) Increased incidence by 254%   Patient's current BMI Ideal Body weight  Body mass index is 30.54 kg/m. Ideal body weight: 61.6 kg (135 lb 12.9 oz) Adjusted ideal body weight: 72.3 kg (159 lb 7.7 oz)   BMI Readings from Last 4 Encounters:  03/14/18 30.54 kg/m  02/13/18 30.70 kg/m  02/05/18  30.70 kg/m  01/14/18 31.00 kg/m   Wt Readings from Last 4 Encounters:  03/14/18 195 lb (88.5 kg)  02/13/18 196 lb (88.9 kg)  02/05/18 196 lb (88.9 kg)  01/14/18 195 lb (88.5 kg)  Psych/Mental status: Alert, oriented x 3 (person, place, & time)       Eyes: PERLA Respiratory: No evidence of acute respiratory distress  Cervical Spine Area Exam  Skin & Axial Inspection: No masses, redness, edema, swelling, or associated skin lesions Alignment: Symmetrical Functional ROM: Unrestricted ROM      Stability: No instability detected Muscle Tone/Strength: Functionally intact. No obvious neuro-muscular anomalies detected. Sensory (Neurological): Unimpaired Palpation: No palpable anomalies              Upper Extremity (UE) Exam    Side: Right upper extremity  Side: Left upper extremity  Skin & Extremity Inspection: Skin color, temperature, and hair growth are WNL. No peripheral edema or cyanosis. No masses, redness, swelling, asymmetry, or associated skin lesions. No contractures.  Skin & Extremity Inspection: Skin color, temperature, and hair growth are WNL. No peripheral edema or cyanosis. No masses, redness, swelling, asymmetry, or associated skin lesions. No contractures.  Functional ROM: Unrestricted ROM          Functional ROM: Unrestricted ROM          Muscle Tone/Strength: Functionally intact. No obvious neuro-muscular anomalies detected.  Muscle Tone/Strength: Functionally intact. No obvious neuro-muscular anomalies detected.  Sensory (Neurological): Unimpaired          Sensory (Neurological): Unimpaired          Palpation: No palpable anomalies              Palpation: No palpable anomalies              Provocative Test(s):  Phalen's test: deferred Tinel's test: deferred Apley's scratch test (touch opposite shoulder):  Action 1 (Across chest): deferred Action 2 (Overhead): deferred Action 3 (LB reach): deferred   Provocative Test(s):  Phalen's test: deferred Tinel's test:  deferred Apley's scratch test (touch opposite shoulder):  Action 1 (Across chest): deferred Action 2 (Overhead): deferred Action 3 (LB reach): deferred    Thoracic Spine Area Exam  Skin & Axial Inspection: No masses, redness, or swelling Alignment: Symmetrical Functional ROM: Unrestricted ROM Stability: No instability detected Muscle Tone/Strength: Functionally intact. No obvious neuro-muscular anomalies detected. Sensory (Neurological): Unimpaired Muscle strength & Tone: No palpable anomalies  Lumbar Spine Area Exam  Skin & Axial Inspection: No masses, redness, or swelling Alignment: Symmetrical Functional ROM: Unrestricted ROM       Stability: No instability detected Muscle Tone/Strength: Functionally intact. No obvious neuro-muscular anomalies detected. Sensory (Neurological): Unimpaired Palpation: No palpable anomalies       Provocative Tests: Hyperextension/rotation test: deferred today       Lumbar quadrant test (Kemp's test): deferred today       Lateral bending test: deferred today       Patrick's Maneuver: deferred today                   FABER test: deferred today                   S-I anterior distraction/compression test: deferred today         S-I lateral compression test: deferred today         S-I Thigh-thrust test: deferred today         S-I Gaenslen's test: deferred today          Gait & Posture Assessment  Ambulation: Unassisted Gait: Relatively normal for age and body habitus Posture: WNL   Lower Extremity Exam    Side: Right lower extremity  Side: Left lower extremity  Stability: No instability observed          Stability: No instability observed          Skin & Extremity Inspection: Skin color, temperature, and hair growth are WNL. No peripheral edema or cyanosis. No masses, redness, swelling, asymmetry, or associated skin lesions. No contractures.  Skin & Extremity Inspection: Skin color, temperature, and hair growth are WNL. No peripheral edema or  cyanosis. No masses, redness, swelling, asymmetry, or associated skin lesions. No contractures.  Functional ROM: Unrestricted ROM                  Functional ROM: Improved after treatment                  Muscle Tone/Strength: Functionally intact. No obvious neuro-muscular anomalies detected.  Muscle Tone/Strength: Functionally intact. No obvious neuro-muscular anomalies detected.  Sensory (Neurological): Unimpaired  Sensory (Neurological): Arthropathic arthralgia  Palpation: No palpable anomalies  Palpation: No  palpable anomalies   Assessment  Primary Diagnosis & Pertinent Problem List: The primary encounter diagnosis was Primary osteoarthritis of left knee. Diagnoses of Chronic pain of left knee and Chronic pain syndrome were also pertinent to this visit.  Status Diagnosis  Responding Responding Responding 1. Primary osteoarthritis of left knee   2. Chronic pain of left knee   3. Chronic pain syndrome      62 year old female with a history of left knee osteoarthritis status post left knee genicular nerve block on 01/14/2018, 02/13/2018 experience greater than 50% pain relief for at least 10 days after both blocks.  Patient endorses improvement in functional status and states that she may have overexerted herself over the last couple of weeks given that she had improvement in her left knee pain after her second genicular nerve block.  We will plan for left knee cooled radiofrequency ablation (Coolief) with sedation.  Risks and benefits of procedure were discussed.  Patient would like to proceed.  Plan of Care   Lab-work, procedure(s), and/or referral(s): Orders Placed This Encounter  Procedures  . Radiofrequency,Genicular    Time Note: Greater than 50% of the 25 minute(s) of face-to-face time spent with Ms. Canner, was spent in counseling/coordination of care regarding: Ms. Szuch primary cause of pain, the treatment plan, treatment alternatives, the risks and possible  complications of proposed treatment, going over the informed consent and the results, interpretation and significance of  her recent diagnostic interventional treatment(s).  Provider-requested follow-up: Return in about 2 weeks (around 03/28/2018) for Procedure.  No future appointments.  Primary Care Physician: [name], MD Location: [facility] Out[facility] Management [facility] Note by: Gillis Santa, M.D Date: 03/14/2018; Time: 10:03 AM  Patient Instructions   Moderate Conscious Sedation, Adult Sedation is the use of medicines to promote relaxation and relieve discomfort and anxiety. Moderate conscious sedation is a type of sedation. Under moderate conscious sedation, you are less alert than normal, but you are still able to respond to instructions, touch, or both. Moderate conscious sedation is used during short medical and dental procedures. It is milder than deep sedation, which is a type of sedation under which you cannot be easily woken up. It is also milder than general anesthesia, which is the use of medicines to make you unconscious. Moderate conscious sedation allows you to return to your regular activities sooner. Tell a health care provider about:  Any allergies you have.  All medicines you are taking, including vitamins, herbs, eye drops, creams, and over-the-counter medicines.  Use of steroids (by mouth or creams).  Any problems you or family members have had with sedatives and anesthetic medicines.  Any blood disorders you have.  Any surgeries you have had.  Any medical conditions you have, such as sleep apnea.  Whether you are pregnant or may be pregnant.  Any use of cigarettes, alcohol, marijuana, or street drugs. What are the risks? Generally, this is a safe procedure. However, problems may occur, including:  Getting too much medicine (oversedation).  Nausea.  Allergic reaction to medicines.  Trouble breathing. If this happens, a breathing tube may be used  to help with breathing. It will be removed when you are awake and breathing on your own.  Heart trouble.  Lung trouble.  What happens before the procedure? Staying hydrated Follow instructions from your health care provider about hydration, which may include:  Up to 2 hours before the procedure - you may continue to drink clear liquids, such as water, clear fruit juice, black coffee,  and plain tea.  Eating and drinking restrictions Follow instructions from your health care provider about eating and drinking, which may include:  8 hours before the procedure - stop eating heavy meals or foods such as meat, fried foods, or fatty foods.  6 hours before the procedure - stop eating light meals or foods, such as toast or cereal.  6 hours before the procedure - stop drinking milk or drinks that contain milk.  2 hours before the procedure - stop drinking clear liquids.  Medicine  Ask your health care provider about:  Changing or stopping your regular medicines. This is especially important if you are taking diabetes medicines or blood thinners.  Taking medicines such as aspirin and ibuprofen. These medicines can thin your blood. Do not take these medicines before your procedure if your health care provider instructs you not to.  Tests and exams  You will have a physical exam.  You may have blood tests done to show: ? How well your kidneys and liver are working. ? How well your blood can clot. General instructions  Plan to have someone take you home from the hospital or clinic.  If you will be going home right after the procedure, plan to have someone with you for 24 hours. What happens during the procedure?  An IV tube will be inserted into one of your veins.  Medicine to help you relax (sedative) will be given through the IV tube.  The medical or dental procedure will be performed. What happens after the procedure?  Your blood pressure, heart rate, breathing rate, and  blood oxygen level will be monitored often until the medicines you were given have worn off.  Do not drive for 24 hours. This information is not intended to replace advice given to you by your health care provider. Make sure you discuss any questions you have with your health care provider. Document Released: 03/14/2001 Document Revised: 11/23/2015 Document Reviewed: 10/09/2015 Elsevier Interactive Patient Education  2018 [place]  What are the risk, side effects and possible complications? Generally speaking, most procedures are safe.  However, with any procedure there are risks, side effects, and the possibility of complications.  The risks and complications are dependent upon the sites that are lesioned, or the type of nerve block to be performed.  The closer the procedure is to the spine, the more serious the risks are.  Great care is taken when placing the radio frequency needles, block needles or lesioning probes, but sometimes complications can occur. 1. Infection: Any time there is an injection through the skin, there is a risk of infection.  This is why sterile conditions are used for these blocks.  There are four possible types of infection. 1. Localized skin infection. 2. Central Nervous System Infection-This can be in the form of Meningitis, which can be deadly. 3. Epidural Infections-This can be in the form of an epidural abscess, which can cause pressure inside of the spine, causing compression of the spinal cord with subsequent paralysis. This would require an emergency surgery to decompress, and there are no guarantees that the patient would recover from the paralysis. 4. Discitis-This is an infection of the intervertebral discs.  It occurs in about 1% of discography procedures.  It is difficult to treat and it may lead to surgery.        2. Pain: the needles have to go through skin and soft tissues, will cause soreness.       3.  Damage to  internal structures:  The nerves to be lesioned may be near blood vessels or    other nerves which can be potentially damaged.       4. Bleeding: Bleeding is more common if the patient is taking blood thinners such as  aspirin, Coumadin, Ticiid, Plavix, etc., or if he/she have some genetic predisposition  such as hemophilia. Bleeding into the spinal canal can cause compression of the spinal  cord with subsequent paralysis.  This would require an emergency surgery to  decompress and there are no guarantees that the patient would recover from the  paralysis.       5. Pneumothorax:  Puncturing of a lung is a possibility, every time a needle is introduced in  the area of the chest or upper back.  Pneumothorax refers to free air around the  collapsed lung(s), inside of the thoracic cavity (chest cavity).  Another two possible  complications related to a similar event would include: Hemothorax and Chylothorax.   These are variations of the Pneumothorax, where instead of air around the collapsed  lung(s), you may have blood or chyle, respectively.       6. Spinal headaches: They may occur with any procedures in the area of the spine.       7. Persistent CSF (Cerebro-Spinal Fluid) leakage: This is a rare problem, but may occur  with prolonged intrathecal or epidural catheters either due to the formation of a fistulous  track or a dural tear.       8. Nerve damage: By working so close to the spinal cord, there is always a possibility of  nerve damage, which could be as serious as a permanent spinal cord injury with  paralysis.       9. Death:  Although rare, severe deadly allergic reactions known as "Anaphylactic  reaction" can occur to any of the medications used.      10. Worsening of the symptoms:  We can always make thing worse.  What are the chances of something like this happening? Chances of any of this occuring are extremely low.  By statistics, you have more of a chance of getting killed in a motor  vehicle accident: while driving to the hospital than any of the above occurring .  Nevertheless, you should be aware that they are possibilities.  In general, it is similar to taking a shower.  Everybody knows that you can slip, hit your head and get killed.  Does that mean that you should not shower again?  Nevertheless always keep in mind that statistics do not mean anything if you happen to be on the wrong side of them.  Even if a procedure has a 1 (one) in a 1,000,000 (million) chance of going wrong, it you happen to be that one..Also, keep in mind that by statistics, you have more of a chance of having something go wrong when taking medications.  Who should not have this procedure? If you are on a blood thinning medication (e.g. Coumadin, Plavix, see list of "Blood Thinners"), or if you have an active infection going on, you should not have the procedure.  If you are taking any blood thinners, please inform your physician.  How should I prepare for this procedure?  Do not eat or drink anything at least six hours prior to the procedure.  Bring a driver with you .  It cannot be a taxi.  Come accompanied by an adult that can drive you back, and that  is strong enough to help you if your legs get weak or numb from the local anesthetic.  Take all of your medicines the morning of the procedure with just enough water to swallow them.  If you have diabetes, make sure that you are scheduled to have your procedure done first thing in the morning, whenever possible.  If you have diabetes, take only half of your insulin dose and notify our nurse that you have done so as soon as you arrive at the clinic.  If you are diabetic, but only take blood sugar pills (oral hypoglycemic), then do not take them on the morning of your procedure.  You may take them after you have had the procedure.  Do not take aspirin or any aspirin-containing medications, at least eleven (11) days prior to the procedure.  They may  prolong bleeding.  Wear loose fitting clothing that may be easy to take off and that you would not mind if it got stained with Betadine or blood.  Do not wear any jewelry or perfume  Remove any nail coloring.  It will interfere with some of our monitoring equipment.  NOTE: Remember that this is not meant to be interpreted as a complete list of all possible complications.  Unforeseen problems may occur.  BLOOD THINNERS The following drugs contain aspirin or other products, which can cause increased bleeding during surgery and should not be taken for 2 weeks prior to and 1 week after surgery.  If you should need take something for relief of minor pain, you may take acetaminophen which is found in Tylenol,m Datril, Anacin-3 and Panadol. It is not blood thinner. The products listed below are.  Do not take any of the products listed below in addition to any listed on your instruction sheet.  A.P.C or A.P.C with Codeine Codeine Phosphate Capsules #3 Ibuprofen Ridaura  ABC compound Congesprin Imuran rimadil  Advil Cope Indocin Robaxisal  Alka-Seltzer Effervescent Pain Reliever and Antacid Coricidin or Coricidin-D  Indomethacin Rufen  Alka-Seltzer plus Cold Medicine Cosprin Ketoprofen S-A-C Tablets  Anacin Analgesic Tablets or Capsules Coumadin Korlgesic Salflex  Anacin Extra Strength Analgesic tablets or capsules CP-2 Tablets Lanoril Salicylate  Anaprox Cuprimine Capsules Levenox Salocol  Anexsia-D Dalteparin Magan Salsalate  Anodynos Darvon compound Magnesium Salicylate Sine-off  Ansaid Dasin Capsules Magsal Sodium Salicylate  Anturane Depen Capsules Marnal Soma  APF Arthritis pain formula Dewitt's Pills Measurin Stanback  Argesic Dia-Gesic Meclofenamic Sulfinpyrazone  Arthritis Bayer Timed Release Aspirin Diclofenac Meclomen Sulindac  Arthritis pain formula Anacin Dicumarol Medipren Supac  Analgesic (Safety coated) Arthralgen Diffunasal Mefanamic Suprofen  Arthritis Strength Bufferin  Dihydrocodeine Mepro Compound Suprol  Arthropan liquid Dopirydamole Methcarbomol with Aspirin Synalgos  ASA tablets/Enseals Disalcid Micrainin Tagament  Ascriptin Doan's Midol Talwin  Ascriptin A/D Dolene Mobidin Tanderil  Ascriptin Extra Strength Dolobid Moblgesic Ticlid  Ascriptin with Codeine Doloprin or Doloprin with Codeine Momentum Tolectin  Asperbuf Duoprin Mono-gesic Trendar  Aspergum Duradyne Motrin or Motrin IB Triminicin  Aspirin plain, buffered or enteric coated Durasal Myochrisine Trigesic  Aspirin Suppositories Easprin Nalfon Trillsate  Aspirin with Codeine Ecotrin Regular or Extra Strength Naprosyn Uracel  Atromid-S Efficin Naproxen Ursinus  Auranofin Capsules Elmiron Neocylate Vanquish  Axotal Emagrin Norgesic Verin  Azathioprine Empirin or Empirin with Codeine Normiflo Vitamin E  Azolid Emprazil Nuprin Voltaren  Bayer Aspirin plain, buffered or children's or timed BC Tablets or powders Encaprin Orgaran Warfarin Sodium  Buff-a-Comp Enoxaparin Orudis Zorpin  Buff-a-Comp with Codeine Equegesic Os-Cal-Gesic   Buffaprin Excedrin plain, buffered or  Extra Strength Oxalid   Bufferin Arthritis Strength Feldene Oxphenbutazone   Bufferin plain or Extra Strength Feldene Capsules Oxycodone with Aspirin   Bufferin with Codeine Fenoprofen Fenoprofen Pabalate or Pabalate-SF   Buffets II Flogesic Panagesic   Buffinol plain or Extra Strength Florinal or Florinal with Codeine Panwarfarin   Buf-Tabs Flurbiprofen Penicillamine   Butalbital Compound Four-way cold tablets Penicillin   Butazolidin Fragmin Pepto-Bismol   Carbenicillin Geminisyn Percodan   Carna Arthritis Reliever Geopen Persantine   Carprofen Gold's salt Persistin   Chloramphenicol Goody's Phenylbutazone   Chloromycetin Haltrain Piroxlcam   Clmetidine heparin Plaquenil   Cllnoril Hyco-pap Ponstel   Clofibrate Hydroxy chloroquine Propoxyphen         Before stopping any of these medications, be sure to consult the  physician who ordered them.  Some, such as Coumadin (Warfarin) are ordered to prevent or treat serious conditions such as "deep thrombosis", "pumonary embolisms", and other heart problems.  The amount of time that you may need off of the medication may also vary with the medication and the reason for which you were taking it.  If you are taking any of these medications, please make sure you notify your pain physician before you undergo any procedures.         Radiofrequency Lesioning Radiofrequency lesioning is a procedure that is performed to relieve pain. The procedure is often used for back, neck, or arm pain. Radiofrequency lesioning involves the use of a machine that creates radio waves to make heat. During the procedure, the heat is applied to the nerve that carries the pain signal. The heat damages the nerve and interferes with the pain signal. Pain relief usually starts about 2 weeks after the procedure and lasts for 6 months to 1 year. Tell a health care provider about:  Any allergies you have.  All medicines you are taking, including vitamins, herbs, eye drops, creams, and over-the-counter medicines.  Any problems you or family members have had with anesthetic medicines.  Any blood disorders you have.  Any surgeries you have had.  Any medical conditions you have.  Whether you are pregnant or may be pregnant. What are the risks? Generally, this is a safe procedure. However, problems may occur, including:  Pain or soreness at the injection site.  Infection at the injection site.  Damage to nerves or blood vessels.  What happens before the procedure?  Ask your health care provider about: ? Changing or stopping your regular medicines. This is especially important if you are taking diabetes medicines or blood thinners. ? Taking medicines such as aspirin and ibuprofen. These medicines can thin your blood. Do not take these medicines before your procedure if your health care  provider instructs you not to.  Follow instructions from your health care provider about eating or drinking restrictions.  Plan to have someone take you home after the procedure.  If you go home right after the procedure, plan to have someone with you for 24 hours. What happens during the procedure?  You will be given one or more of the following: ? A medicine to help you relax (sedative). ? A medicine to numb the area (local anesthetic).  You will be awake during the procedure. You will need to be able to talk with the health care provider during the procedure.  With the help of a type of X-ray (fluoroscopy), the health care provider will insert a radiofrequency needle into the area to be treated.  Next, a wire that carries the radio  waves (electrode) will be put through the radiofrequency needle. An electrical pulse will be sent through the electrode to verify the correct nerve. You will feel a tingling sensation, and you may have muscle twitching.  Then, the tissue that is around the needle tip will be heated by an electric current that is passed using the radiofrequency machine. This will numb the nerves.  A bandage (dressing) will be put on the insertion area after the procedure is done. The procedure may vary among health care providers and hospitals. What happens after the procedure?  Your blood pressure, heart rate, breathing rate, and blood oxygen level will be monitored often until the medicines you were given have worn off.  Return to your normal activities as directed by your health care provider. This information is not intended to replace advice given to you by your health care provider. Make sure you discuss any questions you have with your health care provider. Document Released: 02/15/2011 Document Revised: 11/25/2015 Document Reviewed: 07/27/2014 [employer] Interactive Patient Education  [employer].

## 2018-03-14 NOTE — Patient Instructions (Signed)
Moderate Conscious Sedation, Adult Sedation is the use of medicines to promote relaxation and relieve discomfort and anxiety. Moderate conscious sedation is a type of sedation. Under moderate conscious sedation, you are less alert than normal, but you are still able to respond to instructions, touch, or both. Moderate conscious sedation is used during short medical and dental procedures. It is milder than deep sedation, which is a type of sedation under which you cannot be easily woken up. It is also milder than general anesthesia, which is the use of medicines to make you unconscious. Moderate conscious sedation allows you to return to your regular activities sooner. Tell a health care provider about:  Any allergies you have.  All medicines you are taking, including vitamins, herbs, eye drops, creams, and over-the-counter medicines.  Use of steroids (by mouth or creams).  Any problems you or family members have had with sedatives and anesthetic medicines.  Any blood disorders you have.  Any surgeries you have had.  Any medical conditions you have, such as sleep apnea.  Whether you are pregnant or may be pregnant.  Any use of cigarettes, alcohol, marijuana, or street drugs. What are the risks? Generally, this is a safe procedure. However, problems may occur, including:  Getting too much medicine (oversedation).  Nausea.  Allergic reaction to medicines.  Trouble breathing. If this happens, a breathing tube may be used to help with breathing. It will be removed when you are awake and breathing on your own.  Heart trouble.  Lung trouble.  What happens before the procedure? Staying hydrated Follow instructions from your health care provider about hydration, which may include:  Up to 2 hours before the procedure - you may continue to drink clear liquids, such as water, clear fruit juice, black coffee, and plain tea.  Eating and drinking restrictions Follow instructions from  your health care provider about eating and drinking, which may include:  8 hours before the procedure - stop eating heavy meals or foods such as meat, fried foods, or fatty foods.  6 hours before the procedure - stop eating light meals or foods, such as toast or cereal.  6 hours before the procedure - stop drinking milk or drinks that contain milk.  2 hours before the procedure - stop drinking clear liquids.  Medicine  Ask your health care provider about:  Changing or stopping your regular medicines. This is especially important if you are taking diabetes medicines or blood thinners.  Taking medicines such as aspirin and ibuprofen. These medicines can thin your blood. Do not take these medicines before your procedure if your health care provider instructs you not to.  Tests and exams  You will have a physical exam.  You may have blood tests done to show: ? How well your kidneys and liver are working. ? How well your blood can clot. General instructions  Plan to have someone take you home from the hospital or clinic.  If you will be going home right after the procedure, plan to have someone with you for 24 hours. What happens during the procedure?  An IV tube will be inserted into one of your veins.  Medicine to help you relax (sedative) will be given through the IV tube.  The medical or dental procedure will be performed. What happens after the procedure?  Your blood pressure, heart rate, breathing rate, and blood oxygen level will be monitored often until the medicines you were given have worn off.  Do not drive for 24 hours.   This information is not intended to replace advice given to you by your health care provider. Make sure you discuss any questions you have with your health care provider. Document Released: 03/14/2001 Document Revised: 11/23/2015 Document Reviewed: 10/09/2015 [employer] Interactive Patient Education  2018 [employer] GENERAL RISKS AND  COMPLICATIONS  What are the risk, side effects and possible complications? Generally speaking, most procedures are safe.  However, with any procedure there are risks, side effects, and the possibility of complications.  The risks and complications are dependent upon the sites that are lesioned, or the type of nerve block to be performed.  The closer the procedure is to the spine, the more serious the risks are.  Great care is taken when placing the radio frequency needles, block needles or lesioning probes, but sometimes complications can occur. 1. Infection: Any time there is an injection through the skin, there is a risk of infection.  This is why sterile conditions are used for these blocks.  There are four possible types of infection. 1. Localized skin infection. 2. Central Nervous System Infection-This can be in the form of Meningitis, which can be deadly. 3. Epidural Infections-This can be in the form of an epidural abscess, which can cause pressure inside of the spine, causing compression of the spinal cord with subsequent paralysis. This would require an emergency surgery to decompress, and there are no guarantees that the patient would recover from the paralysis. 4. Discitis-This is an infection of the intervertebral discs.  It occurs in about 1% of discography procedures.  It is difficult to treat and it may lead to surgery.        2. Pain: the needles have to go through skin and soft tissues, will cause soreness.       3. Damage to internal structures:  The nerves to be lesioned may be near blood vessels or    other nerves which can be potentially damaged.       4. Bleeding: Bleeding is more common if the patient is taking blood thinners such as  aspirin, Coumadin, Ticiid, Plavix, etc., or if he/she have some genetic predisposition  such as hemophilia. Bleeding into the spinal canal can cause compression of the spinal  cord with subsequent paralysis.  This would require an emergency surgery  to  decompress and there are no guarantees that the patient would recover from the  paralysis.       5. Pneumothorax:  Puncturing of a lung is a possibility, every time a needle is introduced in  the area of the chest or upper back.  Pneumothorax refers to free air around the  collapsed lung(s), inside of the thoracic cavity (chest cavity).  Another two possible  complications related to a similar event would include: Hemothorax and Chylothorax.   These are variations of the Pneumothorax, where instead of air around the collapsed  lung(s), you may have blood or chyle, respectively.       6. Spinal headaches: They may occur with any procedures in the area of the spine.       7. Persistent CSF (Cerebro-Spinal Fluid) leakage: This is a rare problem, but may occur  with prolonged intrathecal or epidural catheters either due to the formation of a fistulous  track or a dural tear.       8. Nerve damage: By working so close to the spinal cord, there is always a possibility of  nerve damage, which could be as serious as a permanent spinal cord injury with    paralysis.       9. Death:  Although rare, severe deadly allergic reactions known as "Anaphylactic  reaction" can occur to any of the medications used.      10. Worsening of the symptoms:  We can always make thing worse.  What are the chances of something like this happening? Chances of any of this occuring are extremely low.  By statistics, you have more of a chance of getting killed in a motor vehicle accident: while driving to the hospital than any of the above occurring .  Nevertheless, you should be aware that they are possibilities.  In general, it is similar to taking a shower.  Everybody knows that you can slip, hit your head and get killed.  Does that mean that you should not shower again?  Nevertheless always keep in mind that statistics do not mean anything if you happen to be on the wrong side of them.  Even if a procedure has a 1 (one) in a 1,000,000  (million) chance of going wrong, it you happen to be that one..Also, keep in mind that by statistics, you have more of a chance of having something go wrong when taking medications.  Who should not have this procedure? If you are on a blood thinning medication (e.g. Coumadin, Plavix, see list of "Blood Thinners"), or if you have an active infection going on, you should not have the procedure.  If you are taking any blood thinners, please inform your physician.  How should I prepare for this procedure?  Do not eat or drink anything at least six hours prior to the procedure.  Bring a driver with you .  It cannot be a taxi.  Come accompanied by an adult that can drive you back, and that is strong enough to help you if your legs get weak or numb from the local anesthetic.  Take all of your medicines the morning of the procedure with just enough water to swallow them.  If you have diabetes, make sure that you are scheduled to have your procedure done first thing in the morning, whenever possible.  If you have diabetes, take only half of your insulin dose and notify our nurse that you have done so as soon as you arrive at the clinic.  If you are diabetic, but only take blood sugar pills (oral hypoglycemic), then do not take them on the morning of your procedure.  You may take them after you have had the procedure.  Do not take aspirin or any aspirin-containing medications, at least eleven (11) days prior to the procedure.  They may prolong bleeding.  Wear loose fitting clothing that may be easy to take off and that you would not mind if it got stained with Betadine or blood.  Do not wear any jewelry or perfume  Remove any nail coloring.  It will interfere with some of our monitoring equipment.  NOTE: Remember that this is not meant to be interpreted as a complete list of all possible complications.  Unforeseen problems may occur.  BLOOD THINNERS The following drugs contain aspirin or other  products, which can cause increased bleeding during surgery and should not be taken for 2 weeks prior to and 1 week after surgery.  If you should need take something for relief of minor pain, you may take acetaminophen which is found in Tylenol,m Datril, Anacin-3 and Panadol. It is not blood thinner. The products listed below are.  Do not take any of the products listed   the products listed below in addition to any listed on your instruction sheet.  A.P.C or A.P.C with Codeine Codeine Phosphate Capsules #3 Ibuprofen Ridaura  ABC compound Congesprin Imuran rimadil  Advil Cope Indocin Robaxisal  Alka-Seltzer Effervescent Pain Reliever and Antacid Coricidin or Coricidin-D  Indomethacin Rufen  Alka-Seltzer plus Cold Medicine Cosprin Ketoprofen S-A-C Tablets  Anacin Analgesic Tablets or Capsules Coumadin Korlgesic Salflex  Anacin Extra Strength Analgesic tablets or capsules CP-2 Tablets Lanoril Salicylate  Anaprox Cuprimine Capsules Levenox Salocol  Anexsia-D Dalteparin Magan Salsalate  Anodynos Darvon compound Magnesium Salicylate Sine-off  Ansaid Dasin Capsules Magsal Sodium Salicylate  Anturane Depen Capsules Marnal Soma  APF Arthritis pain formula Dewitt's Pills Measurin Stanback  Argesic Dia-Gesic Meclofenamic Sulfinpyrazone  Arthritis Bayer Timed Release Aspirin Diclofenac Meclomen Sulindac  Arthritis pain formula Anacin Dicumarol Medipren Supac  Analgesic (Safety coated) Arthralgen Diffunasal Mefanamic Suprofen  Arthritis Strength Bufferin Dihydrocodeine Mepro Compound Suprol  Arthropan liquid Dopirydamole Methcarbomol with Aspirin Synalgos  ASA tablets/Enseals Disalcid Micrainin Tagament  Ascriptin Doan's Midol Talwin  Ascriptin A/D Dolene Mobidin Tanderil  Ascriptin Extra Strength Dolobid Moblgesic Ticlid  Ascriptin with Codeine Doloprin or Doloprin with Codeine Momentum Tolectin  Asperbuf Duoprin Mono-gesic Trendar  Aspergum Duradyne Motrin or Motrin IB Triminicin  Aspirin plain, buffered or enteric  coated Durasal Myochrisine Trigesic  Aspirin Suppositories Easprin Nalfon Trillsate  Aspirin with Codeine Ecotrin Regular or Extra Strength Naprosyn Uracel  Atromid-S Efficin Naproxen Ursinus  Auranofin Capsules Elmiron Neocylate Vanquish  Axotal Emagrin Norgesic Verin  Azathioprine Empirin or Empirin with Codeine Normiflo Vitamin E  Azolid Emprazil Nuprin Voltaren  Bayer Aspirin plain, buffered or children's or timed BC Tablets or powders Encaprin Orgaran Warfarin Sodium  Buff-a-Comp Enoxaparin Orudis Zorpin  Buff-a-Comp with Codeine Equegesic Os-Cal-Gesic   Buffaprin Excedrin plain, buffered or Extra Strength Oxalid   Bufferin Arthritis Strength Feldene Oxphenbutazone   Bufferin plain or Extra Strength Feldene Capsules Oxycodone with Aspirin   Bufferin with Codeine Fenoprofen Fenoprofen Pabalate or Pabalate-SF   Buffets II Flogesic Panagesic   Buffinol plain or Extra Strength Florinal or Florinal with Codeine Panwarfarin   Buf-Tabs Flurbiprofen Penicillamine   Butalbital Compound Four-way cold tablets Penicillin   Butazolidin Fragmin Pepto-Bismol   Carbenicillin Geminisyn Percodan   Carna Arthritis Reliever Geopen Persantine   Carprofen Gold's salt Persistin   Chloramphenicol Goody's Phenylbutazone   Chloromycetin Haltrain Piroxlcam   Clmetidine heparin Plaquenil   Cllnoril Hyco-pap Ponstel   Clofibrate Hydroxy chloroquine Propoxyphen         Before stopping any of these medications, be sure to consult the physician who ordered them.  Some, such as Coumadin (Warfarin) are ordered to prevent or treat serious conditions such as "deep thrombosis", "pumonary embolisms", and other heart problems.  The amount of time that you may need off of the medication may also vary with the medication and the reason for which you were taking it.  If you are taking any of these medications, please make sure you notify your pain physician before you undergo any procedures.    Radiofrequency  Lesioning Radiofrequency lesioning is a procedure that is performed to relieve pain. The procedure is often used for back, neck, or arm pain. Radiofrequency lesioning involves the use of a machine that creates radio waves to make heat. During the procedure, the heat is applied to the nerve that carries the pain signal. The heat damages the nerve and interferes with the pain signal. Pain relief usually starts about 2 weeks after the procedure and lasts for 6   months to 1 year. Tell a health care provider about:  Any allergies you have.  All medicines you are taking, including vitamins, herbs, eye drops, creams, and over-the-counter medicines.  Any problems you or family members have had with anesthetic medicines.  Any blood disorders you have.  Any surgeries you have had.  Any medical conditions you have.  Whether you are pregnant or may be pregnant. What are the risks? Generally, this is a safe procedure. However, problems may occur, including:  Pain or soreness at the injection site.  Infection at the injection site.  Damage to nerves or blood vessels.  What happens before the procedure?  Ask your health care provider about: ? Changing or stopping your regular medicines. This is especially important if you are taking diabetes medicines or blood thinners. ? Taking medicines such as aspirin and ibuprofen. These medicines can thin your blood. Do not take these medicines before your procedure if your health care provider instructs you not to.  Follow instructions from your health care provider about eating or drinking restrictions.  Plan to have someone take you home after the procedure.  If you go home right after the procedure, plan to have someone with you for 24 hours. What happens during the procedure?  You will be given one or more of the following: ? A medicine to help you relax (sedative). ? A medicine to numb the area (local anesthetic).  You will be awake during the  procedure. You will need to be able to talk with the health care provider during the procedure.  With the help of a type of X-ray (fluoroscopy), the health care provider will insert a radiofrequency needle into the area to be treated.  Next, a wire that carries the radio waves (electrode) will be put through the radiofrequency needle. An electrical pulse will be sent through the electrode to verify the correct nerve. You will feel a tingling sensation, and you may have muscle twitching.  Then, the tissue that is around the needle tip will be heated by an electric current that is passed using the radiofrequency machine. This will numb the nerves.  A bandage (dressing) will be put on the insertion area after the procedure is done. The procedure may vary among health care providers and hospitals. What happens after the procedure?  Your blood pressure, heart rate, breathing rate, and blood oxygen level will be monitored often until the medicines you were given have worn off.  Return to your normal activities as directed by your health care provider. This information is not intended to replace advice given to you by your health care provider. Make sure you discuss any questions you have with your health care provider. Document Released: 02/15/2011 Document Revised: 11/25/2015 Document Reviewed: 07/27/2014 [employer] Interactive Patient Education  2018 [employer]  

## 2018-03-14 NOTE — Progress Notes (Signed)
Safety precautions to be maintained throughout the outpatient stay will include: orient to surroundings, keep bed in low position, maintain call bell within reach at all times, provide assistance with transfer out of bed and ambulation.  

## 2018-03-18 ENCOUNTER — Telehealth: Payer: Self-pay | Admitting: *Deleted

## 2018-04-01 ENCOUNTER — Encounter (INDEPENDENT_AMBULATORY_CARE_PROVIDER_SITE_OTHER): Payer: Self-pay

## 2018-04-01 ENCOUNTER — Ambulatory Visit (HOSPITAL_BASED_OUTPATIENT_CLINIC_OR_DEPARTMENT_OTHER): Payer: BC Managed Care – PPO | Admitting: Student in an Organized Health Care Education/Training Program

## 2018-04-01 ENCOUNTER — Encounter: Payer: Self-pay | Admitting: Student in an Organized Health Care Education/Training Program

## 2018-04-01 ENCOUNTER — Other Ambulatory Visit: Payer: Self-pay

## 2018-04-01 ENCOUNTER — Ambulatory Visit
Admission: RE | Admit: 2018-04-01 | Discharge: 2018-04-01 | Disposition: A | Discharge status: Home / Self Care | Payer: BC Managed Care – PPO | Source: Ambulatory Visit | Attending: Student in an Organized Health Care Education/Training Program | Admitting: Student in an Organized Health Care Education/Training Program

## 2018-04-01 DIAGNOSIS — M1712 Unilateral primary osteoarthritis, left knee: Secondary | ICD-10-CM | POA: Diagnosis present

## 2018-04-01 DIAGNOSIS — Z9889 Other specified postprocedural states: Secondary | ICD-10-CM | POA: Insufficient documentation

## 2018-04-01 DIAGNOSIS — G8929 Other chronic pain: Secondary | ICD-10-CM | POA: Diagnosis not present

## 2018-04-01 MED ORDER — ROPIVACAINE HCL 2 MG/ML IJ SOLN
INTRAMUSCULAR | Status: AC
  Filled 2018-04-01: qty 10

## 2018-04-01 MED ORDER — FENTANYL CITRATE (PF) 100 MCG/2ML IJ SOLN
25.0000 ug | INTRAMUSCULAR | Status: DC | PRN
  Administered 2018-04-01: 100 ug via INTRAVENOUS

## 2018-04-01 MED ORDER — LIDOCAINE HCL 2 % IJ SOLN
INTRAMUSCULAR | Status: AC
  Filled 2018-04-01: qty 20

## 2018-04-01 MED ORDER — DEXAMETHASONE SODIUM PHOSPHATE 10 MG/ML IJ SOLN
INTRAMUSCULAR | Status: AC
  Filled 2018-04-01: qty 1

## 2018-04-01 MED ORDER — FENTANYL CITRATE (PF) 100 MCG/2ML IJ SOLN
INTRAMUSCULAR | Status: AC
  Filled 2018-04-01: qty 2

## 2018-04-01 MED ORDER — LIDOCAINE HCL 2 % IJ SOLN
20.0000 mL | Freq: Once | INTRAMUSCULAR | Status: AC
  Administered 2018-04-01: 400 mg

## 2018-04-01 MED ORDER — DEXAMETHASONE SODIUM PHOSPHATE 10 MG/ML IJ SOLN
10.0000 mg | Freq: Once | INTRAMUSCULAR | Status: AC
  Administered 2018-04-01: 10 mg

## 2018-04-01 MED ORDER — LACTATED RINGERS IV SOLN
1000.0000 mL | Freq: Once | INTRAVENOUS | Status: AC
  Administered 2018-04-01: 1000 mL via INTRAVENOUS

## 2018-04-01 MED ORDER — ROPIVACAINE HCL 2 MG/ML IJ SOLN: 10.0000 mL | Freq: Once | INTRAMUSCULAR | Status: DC

## 2018-04-01 NOTE — Progress Notes (Signed)
Patient's Name: Alisha Sanchez  MRN: [id number]  Referring Provider: Gillis Santa, MD  DOB: [birth year]  PCP: [name], MD  DOS: 04/01/2018  Note by: Gillis Santa, MD  Service setting: Ambulatory outpatient  Specialty: Interventional Pain Management  Patient type: Established  Location: [facility] (AMB) Pain Management Facility  Visit type: Interventional Procedure   Primary Reason for Visit: Interventional Pain Management Treatment. CC: Procedure  Procedure:          Anesthesia, Analgesia, Anxiolysis:  Type: Therapeutic Superior-lateral, Superior-medial, and Inferior-medial, Genicular Nerve COOLIEF Radiofrequency Ablation.           Region: Lateral, Anterior, and Medial aspects of the knee joint, above and below the knee joint proper. Level: Superior and inferior to the knee joint. Laterality: Left  Type: Moderate (Conscious) Sedation combined with Local Anesthesia Indication(s): Analgesia and Anxiety Route: Intravenous (IV) IV Access: Secured Sedation: Meaningful verbal contact was maintained at all times during the procedure  Local Anesthetic: Lidocaine 1-2%   Indications: 1. Primary osteoarthritis of left knee    Alisha Sanchez has been dealing with the above chronic pain for longer than three months and has either failed to respond, was unable to tolerate, or simply did not get enough benefit from other more conservative therapies including, but not limited to: 1. Over-the-counter medications 2. Anti-inflammatory medications 3. Muscle relaxants 4. Membrane stabilizers 5. Opioids 6. Physical therapy and/or chiropractic manipulation 7. Modalities (Heat, ice, etc.) 8. Invasive techniques such as nerve blocks. Alisha Sanchez has attained more than 50% relief of the pain from a series of diagnostic injections conducted in separate occasions.  Pain Score: Pre-procedure: 9 /10 Post-procedure: 0-No pain/10  Pre-op Assessment:  Alisha Sanchez is a 62 y.o. (year old), female  patient, seen today for interventional treatment. She  has a past surgical history that includes Thyroidectomy; c-sections; Colonoscopy (N/A, 11/10/2015); Esophagogastroduodenoscopy (N/A, 11/10/2015); and Cataract extraction w/PHACO (Left, 08/27/2017). Alisha Sanchez has a current medication list which includes the following prescription(s): alendronate, amlodipine, cyanocobalamin, hydrochlorothiazide, ibuprofen, levothyroxine, meclizine, multiple vitamins-minerals, pantoprazole, rosuvastatin, sitagliptin-metformin, turmeric, and ondansetron, and the following Facility-Administered Medications: fentanyl and ropivacaine (pf) 2 mg/ml (0.2%). Her primarily concern today is the Procedure  Initial Vital Signs:  Pulse/HCG Rate: 75  Temp: 98.4 F (36.9 C) Resp: 18 BP: (!) 143/82 SpO2: 99 %  BMI: Estimated body mass index is 31.16 kg/m as calculated from the following:   Height as of this encounter: 5' 6.5" (1.689 m).   Weight as of this encounter: 196 lb (88.9 kg).  Risk Assessment: Allergies: Reviewed. She has No Known Allergies.  Allergy Precautions: None required Coagulopathies: Reviewed. None identified.  Blood-thinner therapy: None at this time Active Infection(s): Reviewed. None identified. Alisha Sanchez is afebrile  Site Confirmation: Alisha Sanchez was asked to confirm the procedure and laterality before marking the site Procedure checklist: Completed Consent: Before the procedure and under the influence of no sedative(s), amnesic(s), or anxiolytics, the patient was informed of the treatment options, risks and possible complications. To fulfill our ethical and legal obligations, as recommended by the American Medical Association's Code of Ethics, I have informed the patient of my clinical impression; the nature and purpose of the treatment or procedure; the risks, benefits, and possible complications of the intervention; the alternatives, including doing nothing; the risk(s) and benefit(s) of  the alternative treatment(s) or procedure(s); and the risk(s) and benefit(s) of doing nothing. The patient was provided information about the general risks and possible complications associated with the procedure. These may include, but  are not limited to: failure to achieve desired goals, infection, bleeding, organ or nerve damage, allergic reactions, paralysis, and death. In addition, the patient was informed of those risks and complications associated to the procedure, such as failure to decrease pain; infection; bleeding; organ or nerve damage with subsequent damage to sensory, motor, and/or autonomic systems, resulting in permanent pain, numbness, and/or weakness of one or several areas of the body; allergic reactions; (i.e.: anaphylactic reaction); and/or death. Furthermore, the patient was informed of those risks and complications associated with the medications. These include, but are not limited to: allergic reactions (i.e.: anaphylactic or anaphylactoid reaction(s)); adrenal axis suppression; blood sugar elevation that in diabetics may result in ketoacidosis or comma; water retention that in patients with history of congestive heart failure may result in shortness of breath, pulmonary edema, and decompensation with resultant heart failure; weight gain; swelling or edema; medication-induced neural toxicity; particulate matter embolism and blood vessel occlusion with resultant organ, and/or nervous system infarction; and/or aseptic necrosis of one or more joints. Finally, the patient was informed that Medicine is not an exact science; therefore, there is also the possibility of unforeseen or unpredictable risks and/or possible complications that may result in a catastrophic outcome. The patient indicated having understood very clearly. We have given the patient no guarantees and we have made no promises. Enough time was given to the patient to ask questions, all of which were answered to the patient's  satisfaction. Alisha Sanchez has indicated that she wanted to continue with the procedure. Attestation: I, the ordering provider, attest that I have discussed with the patient the benefits, risks, side-effects, alternatives, likelihood of achieving goals, and potential problems during recovery for the procedure that I have provided informed consent. Date  Time: 04/01/2018  8:08 AM  Pre-Procedure Preparation:  Monitoring: As per clinic protocol. Respiration, ETCO2, SpO2, BP, heart rate and rhythm monitor placed and checked for adequate function Safety Precautions: Patient was assessed for positional comfort and pressure points before starting the procedure. Time-out: I initiated and conducted the "Time-out" before starting the procedure, as per protocol. The patient was asked to participate by confirming the accuracy of the "Time Out" information. Verification of the correct person, site, and procedure were performed and confirmed by me, the nursing staff, and the patient. "Time-out" conducted as per Joint Commission's Universal Protocol (UP.01.01.01). Time: 0850  Description of Procedure:          Position: Supine Target Area: For Genicular Nerve block(s), the targets are: the superior-lateral genicular nerve, located in the lateral distal portion of the femoral shaft as it curves to form the lateral epicondyle, in the region of the distal femoral metaphysis; the superior-medial genicular nerve, located in the medial distal portion of the femoral shaft as it curves to form the medial epicondyle; and the inferior-medial genicular nerve, located in the medial, proximal portion of the tibial shaft, as it curves to form the medial epicondyle, in the region of the proximal tibial metaphysis. Approach: Anterior, ipsilateral approach. Area Prepped: Entire knee area, from mid-thigh to mid-shin, lateral, anterior, and medial aspects. Prepping solution: Hibiclens (4.0% Chlorhexidine gluconate solution) Safety  Precautions: Aspiration looking for blood return was conducted prior to all injections. At no point did we inject any substances, as a needle was being advanced. No attempts were made at seeking any paresthesias. Safe injection practices and needle disposal techniques used. Medications properly checked for expiration dates. SDV (single dose vial) medications used.  Description of the Procedure: A 17 gauge 50  mm COOLIEF Cooled Radiofrequency needle with a 4 mm active tip was placed overlying the LEFT  knee joint and using fluoroscopic guidance the needle was advanced to a bony endpoint on the superiolateral portion of the femoral epicondyle of the LEFT knee. A second needle was advanced to a bony endpoint on the superiomedial portion of the femoral epicondyle. A third needle was then placed over the inferiomedial portion of the tibial epicondyle until a bony endpoint was met.     Attempted aspiration yielded no blood. Lateral x-ray views showed all the needles with the Radiopaque Band at 50% depth of the femur and tibia.    Motor stimulation was tested at 2.0 volts with no leg movement. Images were saved in AP and lateral.  Then 1cc of 2% Lidocaine was slowly injected into each cannula. Then a radiofrequency ablation of each of the geniculate nerves were done at 80 C for 2 minutes and 30 seconds each. Then 1cc of a solution made of 2 cc of 0.2% Ropivacaine and 1 cc of Decadron 10 mg/cc was injected into each cannula and the needles were withdrawn.    Once the procedure was completed, the needles were then removed and the area cleansed, making sure to leave some of the prepping solution back to take advantage of its long term bactericidal properties.Once the procedure was completed, the needles were then removed and the area cleansed, making sure to leave some of the prepping solution back to take advantage of its long term bactericidal properties.  Intra-operative Compliance: Compliant Vitals:   04/01/18  0940 04/01/18 0950 04/01/18 1000 04/01/18 1010  BP: (!) 145/82 (!) 144/77 128/69 136/76  Pulse: 66 68 64 68  Resp: '11 12 13 13  '$ Temp:  97.6 F (36.4 C)  97.6 F (36.4 C)  TempSrc:  Tympanic  Tympanic  SpO2: 96% 95% 99% 98%  Weight:      Height:        Start Time: 0850 hrs. End Time: 0939 hrs. Materials & Medications:  Needle(s) Type: Teflon-coated, curved tip, Radiofrequency needle(s) Gauge: 17g Length: 5cm Medication(s): Please see orders for medications and dosing details.  Imaging Guidance (Non-Spinal):          Type of Imaging Technique: Fluoroscopy Guidance (Non-Spinal) Indication(s): Assistance in needle guidance and placement for procedures requiring needle placement in or near specific anatomical locations not easily accessible without such assistance. Exposure Time: Please see nurses notes. Contrast: Before injecting any contrast, we confirmed that the patient did not have an allergy to iodine, shellfish, or radiological contrast. Once satisfactory needle placement was completed at the desired level, radiological contrast was injected. Contrast injected under live fluoroscopy. No contrast complications. See chart for type and volume of contrast used. Fluoroscopic Guidance: I was personally present during the use of fluoroscopy. "Tunnel Vision Technique" used to obtain the best possible view of the target area. Parallax error corrected before commencing the procedure. "Direction-depth-direction" technique used to introduce the needle under continuous pulsed fluoroscopy. Once target was reached, antero-posterior, oblique, and lateral fluoroscopic projection used confirm needle placement in all planes. Images permanently stored in EMR. Interpretation: I personally interpreted the imaging intraoperatively. Adequate needle placement confirmed in multiple planes. Appropriate spread of contrast into desired area was observed. No evidence of afferent or efferent intravascular uptake.  Permanent images saved into the patient's record.  Antibiotic Prophylaxis:   Anti-infectives (From admission, onward)   None     Indication(s): None identified  Post-operative Assessment:  Post-procedure Vital Signs:  Pulse/HCG Rate: 68  Temp: 97.6 F (36.4 C) Resp: 13 BP: 136/76 SpO2: 98 %  EBL: None  Complications: No immediate post-treatment complications observed by team, or reported by patient.  Note: The patient tolerated the entire procedure well. A repeat set of vitals were taken after the procedure and the patient was kept under observation following institutional policy, for this type of procedure. Post-procedural neurological assessment was performed, showing return to baseline, prior to discharge. The patient was provided with post-procedure discharge instructions, including a section on how to identify potential problems. Should any problems arise concerning this procedure, the patient was given instructions to immediately contact us, at any time, without hesitation. In any case, we plan to contact the patient by telephone for a follow-up status report regarding this interventional procedure.  Comments:  No additional relevant information. 5 out of 5 strength bilateral lower extremity: Plantar flexion, dorsiflexion, knee flexion, knee extension.  Plan of Care   Imaging Orders     DG C-Arm 1-60 Min-No Report Procedure Orders    No procedure(s) ordered today    Medications ordered for procedure: Meds ordered this encounter  Medications  . lactated ringers infusion 1,000 mL  . fentaNYL (SUBLIMAZE) injection 25-100 mcg    Make sure Narcan is available in the pyxis when using this medication. In the event of respiratory depression (RR< 8/min): Titrate NARCAN (naloxone) in increments of 0.1 to 0.2 mg IV at 2-3 minute intervals, until desired degree of reversal.  . ropivacaine (PF) 2 mg/mL (0.2%) (NAROPIN) injection 10 mL  . lidocaine (XYLOCAINE) 2 % (with pres)  injection 400 mg  . dexamethasone (DECADRON) injection 10 mg   Medications administered: We administered lactated ringers, fentaNYL, lidocaine, and dexamethasone.  See the medical record for exact dosing, route, and time of administration.  New Prescriptions   No medications on file   Disposition: Discharge home  Discharge Date & Time: 04/01/2018; 1015 hrs.   Physician-requested Follow-up: Return in about 6 weeks (around 05/13/2018) for Post Procedure Evaluation.  Future Appointments  Date Time Provider [place]  05/14/2018 11:30 AM Gillis Santa, MD [facility] None   Primary Care Physician: [name], MD Location: [facility] Facility Note by: Gillis Santa, MD Date: 04/01/2018; Time: 12:15 PM  Disclaimer:  Medicine is not an exact science. The only guarantee in medicine is that nothing is guaranteed. It is important to note that the decision to proceed with this intervention was based on the information collected from the patient. The Data and conclusions were drawn from the patient's questionnaire, the interview, and the physical examination. Because the information was provided in large part by the patient, it cannot be guaranteed that it has not been purposely or unconsciously manipulated. Every effort has been made to obtain as much relevant data as possible for this evaluation. It is important to note that the conclusions that lead to this procedure are derived in large part from the available data. Always take into account that the treatment will also be dependent on availability of resources and existing treatment guidelines, considered by other Pain Management Practitioners as being common knowledge and practice, at the time of the intervention. For Medico-Legal purposes, it is also important to point out that variation in procedural techniques and pharmacological choices are the acceptable norm. The indications, contraindications, technique, and  results of the above procedure should only be interpreted and judged by a Board-Certified Interventional Pain Specialist with extensive familiarity and expertise in the same exact procedure and  technique.

## 2018-04-01 NOTE — Patient Instructions (Signed)

## 2018-04-01 NOTE — Progress Notes (Signed)
Safety precautions to be maintained throughout the outpatient stay will include: orient to surroundings, keep bed in low position, maintain call bell within reach at all times, provide assistance with transfer out of bed and ambulation.  

## 2018-04-02 ENCOUNTER — Telehealth: Payer: Self-pay | Admitting: *Deleted

## 2018-04-02 NOTE — Telephone Encounter (Signed)
Attempted to call for post procedure follow-up. Message left. 

## 2018-05-14 ENCOUNTER — Encounter: Payer: Self-pay | Admitting: Student in an Organized Health Care Education/Training Program

## 2018-05-14 ENCOUNTER — Ambulatory Visit
Payer: BC Managed Care – PPO | Attending: Student in an Organized Health Care Education/Training Program | Admitting: Student in an Organized Health Care Education/Training Program

## 2018-05-14 ENCOUNTER — Other Ambulatory Visit: Payer: Self-pay

## 2018-05-14 VITALS — BP 150/91 | HR 76 | Temp 97.8°F | Resp 16 | Ht 67.0 in | Wt 196.0 lb

## 2018-05-14 DIAGNOSIS — G8929 Other chronic pain: Secondary | ICD-10-CM | POA: Insufficient documentation

## 2018-05-14 DIAGNOSIS — E039 Hypothyroidism, unspecified: Secondary | ICD-10-CM | POA: Diagnosis not present

## 2018-05-14 DIAGNOSIS — I10 Essential (primary) hypertension: Secondary | ICD-10-CM | POA: Diagnosis not present

## 2018-05-14 DIAGNOSIS — Z9889 Other specified postprocedural states: Secondary | ICD-10-CM | POA: Diagnosis not present

## 2018-05-14 DIAGNOSIS — Z7984 Long term (current) use of oral hypoglycemic drugs: Secondary | ICD-10-CM | POA: Diagnosis not present

## 2018-05-14 DIAGNOSIS — E119 Type 2 diabetes mellitus without complications: Secondary | ICD-10-CM | POA: Insufficient documentation

## 2018-05-14 DIAGNOSIS — M1712 Unilateral primary osteoarthritis, left knee: Secondary | ICD-10-CM | POA: Insufficient documentation

## 2018-05-14 DIAGNOSIS — G894 Chronic pain syndrome: Secondary | ICD-10-CM | POA: Diagnosis present

## 2018-05-14 DIAGNOSIS — E785 Hyperlipidemia, unspecified: Secondary | ICD-10-CM | POA: Diagnosis not present

## 2018-05-14 DIAGNOSIS — M25562 Pain in left knee: Secondary | ICD-10-CM

## 2018-05-14 DIAGNOSIS — Z833 Family history of diabetes mellitus: Secondary | ICD-10-CM | POA: Diagnosis not present

## 2018-05-14 DIAGNOSIS — Z79899 Other long term (current) drug therapy: Secondary | ICD-10-CM | POA: Diagnosis not present

## 2018-05-14 DIAGNOSIS — K219 Gastro-esophageal reflux disease without esophagitis: Secondary | ICD-10-CM | POA: Diagnosis not present

## 2018-05-14 DIAGNOSIS — Z79890 Hormone replacement therapy (postmenopausal): Secondary | ICD-10-CM | POA: Diagnosis not present

## 2018-05-14 NOTE — Progress Notes (Signed)
Safety precautions to be maintained throughout the outpatient stay will include: orient to surroundings, keep bed in low position, maintain call bell within reach at all times, provide assistance with transfer out of bed and ambulation.  

## 2018-05-14 NOTE — Progress Notes (Signed)
Patient's Name: Alisha Sanchez  MRN: [id number]  Referring Provider: [name], MD  DOB: 12-Aug-1955  PCP: [name], MD  DOS: 05/14/2018  Note by: Gillis Santa, MD  Service setting: Ambulatory outpatient  Specialty: Interventional Pain Management  Location: [facility] (AMB) Pain Management Facility    Patient type: Established   Primary Reason(s) for Visit: Encounter for post-procedure evaluation of chronic illness with mild to moderate exacerbation CC: Knee Pain (left)  HPI  Alisha Sanchez is a 62 y.o. year old, female patient, who comes today for a post-procedure evaluation. 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 Her primarily concern today is the Knee Pain (left)  Pain Assessment: Location: Left Knee Radiating: radiates around knee sometimes Onset: More than a month ago Duration: Chronic pain Quality: Larence Penning Severity: 8 /10 (subjective, self-reported pain score)  Note: Reported level is inconsistent with clinical observations. Clinically the patient looks like a 2/10 A 2/10 is viewed as "Mild to Moderate" and described as noticeable and distracting. Impossible to hide from other people. More frequent flare-ups. Still possible to adapt and function close to normal. It can be very annoying and may have occasional stronger flare-ups. With discipline, patients may get used to it and adapt. Information on the proper use of the pain scale provided to the patient today. When using our objective Pain Scale, levels between 6 and 10/10 are said to belong in an emergency room, as it progressively worsens from a 6/10, described as severely limiting, requiring emergency care not usually available at an outpatient pain management facility. At a  6/10 level, communication becomes difficult and requires great effort. Assistance to reach the emergency department may be required. Facial flushing and profuse sweating along with potentially dangerous increases in heart rate and blood pressure will be evident. Effect on ADL: "I get tired out" Timing: Intermittent Modifying factors: medications, procedure some, CBD oil BP: (!) 150/91  HR: 76  Alisha Sanchez comes in today for post-procedure evaluation.  Patient follows up.  States that the left knee genicular RFA completely eliminate her pain for approximately 2 weeks and now she has had gradual return of pain thereafter.  She is currently endorsing ongoing pain relief is approximately 30 to 50%.  She is complaining of more focal lateral knee pain and states that the medial component of her pain is improved.  She states that her pain is worse after weightbearing however her overall baseline pain especially in the morning has improved after ablation.  Further details on both, my assessment(s), as well as the proposed treatment plan, please see below.  Post-Procedure Assessment  04/01/2018 Procedure: Left Cool[place] genicular RFA Pre-procedure pain score:  5/10 Post-procedure pain score: 0/10         Influential Factors: BMI: 30.70 kg/m Intra-procedural challenges: None observed.         Assessment challenges: None detected.              Reported side-effects: None.        Post-procedural adverse reactions or complications: None reported         Sedation: Please see nurses note. When no sedatives are used, the analgesic levels obtained are directly associated to the effectiveness of the local anesthetics. However, when sedation is provided, the level of analgesia obtained during the initial  1 hour following the intervention, is believed to be the result of a combination of factors. These factors may include, but are not limited to: 1. The effectiveness of the local anesthetics used. 2. The  effects of the analgesic(s) and/or anxiolytic(s) used. 3. The degree of discomfort experienced by the patient at the time of the procedure. 4. The patients ability and reliability in recalling and recording the events. 5. The presence and influence of possible secondary gains and/or psychosocial factors. Reported result: Relief experienced during the 1st hour after the procedure: 100 % (Ultra-Short Term Relief)            Interpretative annotation: Clinically appropriate result. Analgesia during this period is likely to be Local Anesthetic and/or IV Sedative (Analgesic/Anxiolytic) related.          Effects of local anesthetic: The analgesic effects attained during this period are directly associated to the localized infiltration of local anesthetics and therefore cary significant diagnostic value as to the etiological location, or anatomical origin, of the pain. Expected duration of relief is directly dependent on the pharmacodynamics of the local anesthetic used. Long-acting (4-6 hours) anesthetics used.  Reported result: Relief during the next 4 to 6 hour after the procedure: 90 % (Short-Term Relief)            Interpretative annotation: Clinically appropriate result. Analgesia during this period is likely to be Local Anesthetic-related.          Long-term benefit: Defined as the period of time past the expected duration of local anesthetics (1 hour for short-acting and 4-6 hours for long-acting). With the possible exception of prolonged sympathetic blockade from the local anesthetics, benefits during this period are typically attributed to, or associated with, other factors such as analgesic sensory neuropraxia, antiinflammatory effects, or beneficial biochemical changes provided by agents other than the local anesthetics.  Reported result: Extended relief following procedure: 80 %(lasting 1 week) (Long-Term Relief)            Interpretative annotation: Clinically possible results. Good relief.   Inflammation plays a part in the etiology to the pain.          Current benefits: Defined as reported results that persistent at this point in time.   Analgesia: 25-50 %           Patient endorses pain in her left knee after she has been weightbearing for an extended period of time pain  however overall, global pain improved after RFA.  Complaining of focal lateral knee pain. Function: Somewhat improved ROM: Somewhat improved Interpretative annotation: Ongoing benefit. Therapeutic benefit observed. Adequate RF ablation.          Interpretation: Results would suggest adequate radiofrequency ablation.                  Plan:  Please see "Plan of Care" for details.                Laboratory Chemistry  Inflammation Markers (CRP: Acute Phase) (ESR: Chronic Phase) No results found for: CRP, ESRSEDRATE, LATICACIDVEN                       Rheumatology Markers No results found for: RF, ANA, LABURIC, URICUR, LYMEIGGIGMAB, LYMEABIGMQN, HLAB27                      Renal Function Markers Lab Results  Component Value Date   BUN 14 10/27/2015   CREATININE 0.59 10/27/2015   GFRAA >  60 10/27/2015   GFRNONAA >60 10/27/2015                             Hepatic Function Markers Lab Results  Component Value Date   AST 19 10/27/2015   ALT 18 10/27/2015   ALBUMIN 4.2 10/27/2015   ALKPHOS 50 10/27/2015                        Electrolytes Lab Results  Component Value Date   NA 139 10/27/2015   K 3.5 10/27/2015   CL 107 10/27/2015   CALCIUM 9.0 10/27/2015                        Neuropathy Markers No results found for: VITAMINB12, FOLATE, HGBA1C, HIV                      CNS Tests No results found for: COLORCSF, APPEARCSF, RBCCOUNTCSF, WBCCSF, POLYSCSF, LYMPHSCSF, EOSCSF, PROTEINCSF, GLUCCSF, JCVIRUS, CSFOLI, IGGCSF                      Bone Pathology Markers No results found for: VD25OH, [id number], [id number], [id number], 25OHVITD1, 25OHVITD2, 25OHVITD3, TESTOFREE, TESTOSTERONE                        Coagulation Parameters Lab Results  Component Value Date   INR 0.9 07/22/2007   LABPROT 12.2 07/22/2007   PLT 236 10/27/2015                        Cardiovascular Markers Lab Results  Component Value Date   BNP 16 08/11/2013   TROPONINI <0.03 10/27/2015   HGB 13.6 10/27/2015   HCT 39.2 10/27/2015                         CA Markers No results found for: CEA, CA125, LABCA2                      Note: Lab results reviewed.  Recent Diagnostic Imaging Results  DG C-Arm 1-60 Min-No Report Fluoroscopy was utilized by the requesting physician.  No radiographic  interpretation.   Complexity Note: Imaging results reviewed. Results shared with Alisha Sanchez, using Layman's terms.                         Meds   Current Outpatient Medications:  .  alendronate (FOSAMAX) 70 MG tablet, Take 70 mg by mouth once a week. Take with a full glass of water on an empty stomach., Disp: , Rfl:  .  amLODipine (NORVASC) 5 MG tablet, Take 5 mg by mouth daily., Disp: , Rfl:  .  Cyanocobalamin (VITAMIN B-12 PO), Take by mouth daily., Disp: , Rfl:  .  hydrochlorothiazide (HYDRODIURIL) 25 MG tablet, Take 25 mg by mouth daily., Disp: , Rfl:  .  ibuprofen (ADVIL,MOTRIN) 200 MG tablet, Take 400 mg by mouth every 6 (six) hours as needed., Disp: , Rfl:  .  levothyroxine (SYNTHROID, LEVOTHROID) 112 MCG tablet, Take 125 mcg by mouth daily. , Disp: , Rfl:  .  meclizine (ANTIVERT) 25 MG tablet, Take 25 mg by mouth 3 (three) times daily as needed for dizziness., Disp: , Rfl:  .  Multiple Vitamins-Minerals (MULTIVITAMIN ADULT PO), Take by mouth  daily., Disp: , Rfl:  .  NON FORMULARY, CBD oil, Disp: , Rfl:  .  ondansetron (ZOFRAN) 4 MG tablet, Take 1-2 tabs by mouth every 8 hours as needed for nausea/vomiting, Disp: 30 tablet, Rfl: 0 .  pantoprazole (PROTONIX) 40 MG tablet, Take 40 mg by mouth daily., Disp: , Rfl:  .  rosuvastatin (CRESTOR) 10 MG tablet, Take 10 mg by mouth daily., Disp: , Rfl:  .   sitaGLIPtin-metformin (JANUMET) 50-500 MG tablet, Take 1 tablet by mouth 2 (two) times daily with a meal., Disp: , Rfl:  .  SYNTHROID 125 MCG tablet, TK 1 T PO ONCE DAILY ON AN EMPTY STOMACH WITH A GLASS OF WATER AT LEAST 30 TO 60 MINUTES BEFORE BRE, Disp: , Rfl: 10 .  TURMERIC PO, Take by mouth daily., Disp: , Rfl:   ROS  Constitutional: Denies any fever or chills Gastrointestinal: No reported hemesis, hematochezia, vomiting, or acute GI distress Musculoskeletal: Denies any acute onset joint swelling, redness, loss of ROM, or weakness Neurological: No reported episodes of acute onset apraxia, aphasia, dysarthria, agnosia, amnesia, paralysis, loss of coordination, or loss of consciousness  Allergies  Alisha Sanchez has No Known Allergies.  PFSH  Drug: Alisha Sanchez  reports that she does not use drugs. Alcohol:  reports that she drinks about 1.0 standard drinks of alcohol per week. Tobacco:  reports that she has never smoked. She has never used smokeless tobacco. Medical:  has a past medical history of Arthritis, Cancer ([place]), Diabetes mellitus type 2 in obese ([place]), GERD (gastroesophageal reflux disease), Headache, HLD (hyperlipidemia), HTN (hypertension), Hypothyroidism, Localized swelling of both lower legs, Neck pain, Numbness and tingling, Pulmonary embolus ([place]), and Vertigo. Surgical: Alisha Sanchez  has a past surgical history that includes Thyroidectomy; c-sections; Colonoscopy (N/A, 11/10/2015); Esophagogastroduodenoscopy (N/A, 11/10/2015); and Cataract extraction w/PHACO (Left, 08/27/2017). Family: family history includes Breast cancer (age of onset: 75) in her maternal aunt; Breast cancer (age of onset: 73) in her paternal aunt; Diabetes in her father; Liver cancer in her father; Lung cancer in her father; Stroke in her other.  Constitutional Exam  General appearance: Well nourished, well developed, and well hydrated. In no apparent acute distress Vitals:   05/14/18 1128  BP: (!)  150/91  Pulse: 76  Resp: 16  Temp: 97.8 F (36.6 C)  SpO2: 99%  Weight: 196 lb (88.9 kg)  Height: '5\' 7"'$  (1.702 m)   BMI Assessment: Estimated body mass index is 30.7 kg/m as calculated from the following:   Height as of this encounter: '5\' 7"'$  (1.702 m).   Weight as of this encounter: 196 lb (88.9 kg).  BMI interpretation table: BMI level Category Range association with higher incidence of chronic pain  <18 kg/m2 Underweight   18.5-24.9 kg/m2 Ideal body weight   25-29.9 kg/m2 Overweight Increased incidence by 20%  30-34.9 kg/m2 Obese (Class I) Increased incidence by 68%  35-39.9 kg/m2 Severe obesity (Class II) Increased incidence by 136%  >40 kg/m2 Extreme obesity (Class III) Increased incidence by 254%   Patient's current BMI Ideal Body weight  Body mass index is 30.7 kg/m. Ideal body weight: 61.6 kg (135 lb 12.9 oz) Adjusted ideal body weight: 72.5 kg (159 lb 14.1 oz)   BMI Readings from Last 4 Encounters:  05/14/18 30.70 kg/m  04/01/18 31.16 kg/m  03/14/18 30.54 kg/m  02/13/18 30.70 kg/m   Wt Readings from Last 4 Encounters:  05/14/18 196 lb (88.9 kg)  04/01/18 196 lb (88.9 kg)  03/14/18 195 lb (88.5 kg)  02/13/18 196 lb (88.9 kg)  Psych/Mental status: Alert, oriented x 3 (person, place, & time)       Eyes: PERLA Respiratory: No evidence of acute respiratory distress  Cervical Spine Area Exam  Skin & Axial Inspection: No masses, redness, edema, swelling, or associated skin lesions Alignment: Symmetrical Functional ROM: Unrestricted ROM      Stability: No instability detected Muscle Tone/Strength: Functionally intact. No obvious neuro-muscular anomalies detected. Sensory (Neurological): Unimpaired Palpation: No palpable anomalies              Upper Extremity (UE) Exam    Side: Right upper extremity  Side: Left upper extremity  Skin & Extremity Inspection: Skin color, temperature, and hair growth are WNL. No peripheral edema or cyanosis. No masses, redness,  swelling, asymmetry, or associated skin lesions. No contractures.  Skin & Extremity Inspection: Skin color, temperature, and hair growth are WNL. No peripheral edema or cyanosis. No masses, redness, swelling, asymmetry, or associated skin lesions. No contractures.  Functional ROM: Unrestricted ROM          Functional ROM: Unrestricted ROM          Muscle Tone/Strength: Functionally intact. No obvious neuro-muscular anomalies detected.  Muscle Tone/Strength: Functionally intact. No obvious neuro-muscular anomalies detected.  Sensory (Neurological): Unimpaired          Sensory (Neurological): Unimpaired          Palpation: No palpable anomalies              Palpation: No palpable anomalies              Provocative Test(s):  Phalen's test: deferred Tinel's test: deferred Apley's scratch test (touch opposite shoulder):  Action 1 (Across chest): deferred Action 2 (Overhead): deferred Action 3 (LB reach): deferred   Provocative Test(s):  Phalen's test: deferred Tinel's test: deferred Apley's scratch test (touch opposite shoulder):  Action 1 (Across chest): deferred Action 2 (Overhead): deferred Action 3 (LB reach): deferred    Thoracic Spine Area Exam  Skin & Axial Inspection: No masses, redness, or swelling Alignment: Symmetrical Functional ROM: Unrestricted ROM Stability: No instability detected Muscle Tone/Strength: Functionally intact. No obvious neuro-muscular anomalies detected. Sensory (Neurological): Unimpaired Muscle strength & Tone: No palpable anomalies  Lumbar Spine Area Exam  Skin & Axial Inspection: No masses, redness, or swelling Alignment: Symmetrical Functional ROM: Unrestricted ROM       Stability: No instability detected Muscle Tone/Strength: Functionally intact. No obvious neuro-muscular anomalies detected. Sensory (Neurological): Unimpaired Palpation: No palpable anomalies       Provocative Tests: Hyperextension/rotation test: deferred today       Lumbar  quadrant test (Kemp's test): deferred today       Lateral bending test: deferred today       Patrick's Maneuver: deferred today                   FABER test: deferred today                   S-I anterior distraction/compression test: deferred today         S-I lateral compression test: deferred today         S-I Thigh-thrust test: deferred today         S-I Gaenslen's test: deferred today          Gait & Posture Assessment  Ambulation: Unassisted Gait: Relatively normal for age and body habitus Posture: WNL   Lower Extremity Exam    Side: Right lower extremity  Side: Left lower extremity  Stability: No instability observed          Stability: No instability observed          Skin & Extremity Inspection: Skin color, temperature, and hair growth are WNL. No peripheral edema or cyanosis. No masses, redness, swelling, asymmetry, or associated skin lesions. No contractures.  Skin & Extremity Inspection: Skin color, temperature, and hair growth are WNL. No peripheral edema or cyanosis. No masses, redness, swelling, asymmetry, or associated skin lesions. No contractures.  Functional ROM: Unrestricted ROM                  Functional ROM: Impaired ROM                  Muscle Tone/Strength: Functionally intact. No obvious neuro-muscular anomalies detected.  Muscle Tone/Strength: Functionally intact. No obvious neuro-muscular anomalies detected.  Sensory (Neurological): Unimpaired  Sensory (Neurological): Articular pain pattern most pronounced at the lateral aspect of her left knee, improved from before.  Palpation: No palpable anomalies  Palpation: No palpable anomalies   Assessment  Primary Diagnosis & Pertinent Problem List: The primary encounter diagnosis was Primary osteoarthritis of left knee. Diagnoses of Chronic pain of left knee and Chronic pain syndrome were also pertinent to this visit.  Status Diagnosis  Controlled Improved Controlled 1. Primary osteoarthritis of left knee   2.  Chronic pain of left knee   3. Chronic pain syndrome     Problems updated and reviewed during this visit: Problem  Primary Osteoarthritis of Left Knee  Chronic Pain of Left Knee  Chronic Pain Syndrome   Plan of Care   Provider-requested follow-up: Return in about 5 months (around 10/13/2018), or if symptoms worsen or fail to improve.  Future Appointments  Date Time Provider [place]  10/10/2018  8:45 AM Gillis Santa, MD [facility] None    Time Note: Greater than 50% of the 15 minute(s) of face-to-face time spent with Alisha Sanchez, was spent in counseling/coordination of care regarding: the appropriate use of the pain scale, Alisha Sanchez primary cause of pain, the treatment plan, the results, interpretation and significance of  her recent diagnostic interventional treatment(s) and realistic expectations.  Primary Care Physician: [name], MD Location: [facility] Note by: Gillis Santa, M.D Date: 05/14/2018; Time: 2:16 PM  Patient Instructions  Return prn

## 2018-05-14 NOTE — Patient Instructions (Signed)
Return prn 

## 2018-06-04 ENCOUNTER — Other Ambulatory Visit: Payer: Self-pay | Admitting: Family Medicine

## 2018-06-04 DIAGNOSIS — Z1231 Encounter for screening mammogram for malignant neoplasm of breast: Secondary | ICD-10-CM

## 2018-07-11 ENCOUNTER — Ambulatory Visit
Admission: RE | Admit: 2018-07-11 | Discharge: 2018-07-11 | Disposition: A | Discharge status: Home / Self Care | Payer: BC Managed Care – PPO | Source: Ambulatory Visit | Attending: Family Medicine | Admitting: Family Medicine

## 2018-07-11 DIAGNOSIS — Z1231 Encounter for screening mammogram for malignant neoplasm of breast: Secondary | ICD-10-CM | POA: Diagnosis not present

## 2018-08-01 ENCOUNTER — Encounter (INDEPENDENT_AMBULATORY_CARE_PROVIDER_SITE_OTHER): Payer: Self-pay | Admitting: Vascular Surgery

## 2018-08-01 ENCOUNTER — Ambulatory Visit (INDEPENDENT_AMBULATORY_CARE_PROVIDER_SITE_OTHER): Payer: BC Managed Care – PPO | Admitting: Vascular Surgery

## 2018-08-01 VITALS — BP 166/95 | HR 79 | Resp 18 | Ht 67.0 in | Wt 190.2 lb

## 2018-08-01 DIAGNOSIS — E782 Mixed hyperlipidemia: Secondary | ICD-10-CM | POA: Diagnosis not present

## 2018-08-01 DIAGNOSIS — G8929 Other chronic pain: Secondary | ICD-10-CM

## 2018-08-01 DIAGNOSIS — Z86711 Personal history of pulmonary embolism: Secondary | ICD-10-CM | POA: Diagnosis not present

## 2018-08-01 DIAGNOSIS — M25562 Pain in left knee: Secondary | ICD-10-CM

## 2018-08-01 DIAGNOSIS — M1712 Unilateral primary osteoarthritis, left knee: Secondary | ICD-10-CM | POA: Diagnosis not present

## 2018-08-01 DIAGNOSIS — I10 Essential (primary) hypertension: Secondary | ICD-10-CM

## 2018-08-04 ENCOUNTER — Encounter (INDEPENDENT_AMBULATORY_CARE_PROVIDER_SITE_OTHER): Payer: Self-pay | Admitting: Vascular Surgery

## 2018-08-04 DIAGNOSIS — Z86711 Personal history of pulmonary embolism: Secondary | ICD-10-CM | POA: Insufficient documentation

## 2018-08-04 NOTE — Progress Notes (Signed)
MRN : [id number]  Alisha Sanchez is a 63 y.o. (05-Jan-1956) female who presents with chief complaint of  Chief Complaint  Patient presents with  . New Patient (Initial Visit)  .  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  PE was identified years ago, (2009) and was treated with anticoagulation.  The presenting symptoms were pain and swelling in the lower extremity.  The patient notes the leg continues to be mildly painful with dependency and still swells quite a bite.  Symptoms are much better with elevation.  The patient notes minimal edema in the morning which steadily worsens throughout the day.    The patient has not been using compression therapy at this point.  No SOB or pleuritic chest pains.  No cough or hemoptysis.  No blood per rectum or blood in any sputum.  No excessive bruising per the patient.     Current Meds  Medication Sig  . alendronate (FOSAMAX) 70 MG tablet Take 70 mg by mouth once a week. Take with a full glass of water on an empty stomach.  Marland Kitchen amLODipine (NORVASC) 5 MG tablet Take 5 mg by mouth daily.  . Cyanocobalamin (VITAMIN B-12 PO) Take by mouth daily.  . hydrochlorothiazide (HYDRODIURIL) 25 MG tablet Take 25 mg by mouth daily.  Marland Kitchen ibuprofen (ADVIL,MOTRIN) 200 MG tablet Take 400 mg by mouth every 6 (six) hours as needed.  . meclizine (ANTIVERT) 25 MG tablet Take 25 mg by mouth 3 (three) times daily as needed for dizziness.  . Multiple Vitamins-Minerals (MULTIVITAMIN ADULT PO) Take by mouth daily.  . ondansetron (ZOFRAN) 4 MG tablet Take 1-2 tabs by mouth every 8 hours as needed for nausea/vomiting  . pantoprazole (PROTONIX) 40 MG tablet Take 40 mg by mouth daily.  . rosuvastatin (CRESTOR) 10 MG tablet Take 10 mg by mouth daily.  . sitaGLIPtin-metformin (JANUMET) 50-500 MG tablet Take 1 tablet by mouth 2 (two) times daily with a  meal.  . SYNTHROID 125 MCG tablet TK 1 T PO ONCE DAILY ON AN EMPTY STOMACH WITH A GLASS OF WATER AT LEAST 30 TO 60 MINUTES BEFORE BRE  . TURMERIC PO Take by mouth daily.    Past Medical History:  Diagnosis Date  . Arthritis    knees  . Cancer ([place])    Thyroid  . Diabetes mellitus type 2 in obese ([place])   . GERD (gastroesophageal reflux disease)   . Headache    migraines - 3-4x/yr  . HLD (hyperlipidemia)   . HTN (hypertension)   . Hypothyroidism    no thyroid  . Localized swelling of both lower legs    leaky veins  . Neck pain    left side  . Numbness and tingling    numbness in hands and face tingles  . Pulmonary embolus ([place])    after thyroid surgery  . Vertigo    no episodes in approx 2 yrs    Past Surgical History:  Procedure Laterality Date  . c-sections     x2   . CATARACT EXTRACTION W/PHACO Left 08/27/2017   Procedure: CATARACT EXTRACTION PHACO AND INTRAOCULAR LENS PLACEMENT ([place]) LEFT DIABETIC;  Surgeon: Leandrew Koyanagi, MD;  Location: [place];  Service: Ophthalmology;  Laterality: Left;  Diabetic - oral meds  . COLONOSCOPY N/A 11/10/2015   Procedure: COLONOSCOPY;  Surgeon: Hulen Luster, MD;  Location: [facility];  Service: Gastroenterology;  Laterality: N/A;  . ESOPHAGOGASTRODUODENOSCOPY N/A 11/10/2015   Procedure: ESOPHAGOGASTRODUODENOSCOPY (EGD) with dialation;  Surgeon: Hulen Luster, MD;  Location: [place] [facility];  Service: Gastroenterology;  Laterality: N/A;  . THYROIDECTOMY      Social History Social History   Tobacco Use  . Smoking status: Never Smoker  . Smokeless tobacco: Never Used  . Tobacco comment: tobacco use- no  Substance Use Topics  . Alcohol use: Yes    Alcohol/week: 1.0 standard drinks    Types: 1 Glasses of wine per week    Comment: occassional wine  . Drug use: No    Family History Family History  Problem Relation Age of Onset  . Liver cancer Father   . Lung cancer Father   . Diabetes Father   . Stroke  Other   . Breast cancer Maternal Aunt 32  . Breast cancer Paternal Aunt 68  No family history of bleeding/clotting disorders, porphyria or autoimmune disease   No Known Allergies   REVIEW OF SYSTEMS (Negative unless checked)  Constitutional: [] Weight loss  [] Fever  [] Chills Cardiac: [] Chest pain   [] Chest pressure   [] Palpitations   [] Shortness of breath when laying flat   [x] Shortness of breath with exertion. Vascular:  [] Pain in legs with walking   [] Pain in legs at rest  [x] History of PE   [] Phlebitis   [] Swelling in legs   [] Varicose veins   [] Non-healing ulcers Pulmonary:   [] Uses home oxygen   [] Productive cough   [] Hemoptysis   [] Wheeze  [] COPD   [] Asthma Neurologic:  [] Dizziness   [] Seizures   [] History of stroke   [] History of TIA  [] Aphasia   [] Vissual changes   [] Weakness or numbness in arm   [] Weakness or numbness in leg Musculoskeletal:   [x] Joint swelling   [x] Joint pain   [] Low back pain Hematologic:  [] Easy bruising  [] Easy bleeding   [] Hypercoagulable state   [] Anemic Gastrointestinal:  [] Diarrhea   [] Vomiting  [] Gastroesophageal reflux/heartburn   [] Difficulty swallowing. Genitourinary:  [] Chronic kidney disease   [] Difficult urination  [] Frequent urination   [] Blood in urine Skin:  [] Rashes   [] Ulcers  Psychological:  [] History of anxiety   []  History of major depression.  Physical Examination  Vitals:   08/01/18 1051  BP: (!) 166/95  Pulse: 79  Resp: 18  Weight: 190 lb 3.2 oz (86.3 kg)  Height: 5\' 7"  (1.702 m)   Body mass index is 29.79 kg/m. Gen: WD/WN, NAD Head: [place]/AT, No temporalis wasting.  Ear/Nose/Throat: Hearing grossly intact, nares w/o erythema or drainage, poor dentition Eyes: PER, EOMI, sclera nonicteric.  Neck: Supple, no masses.  No bruit or JVD.  Pulmonary:  Good air movement, clear to auscultation bilaterally, no use of accessory muscles.  Cardiac: RRR, normal S1, S2, no Murmurs. Vascular: scattered varicosities present bilaterally.  Mild  venous stasis changes to the legs bilaterally.  2+ soft pitting edema Vessel Right Left  Radial Palpable Palpable  PT Palpable Palpable  DP Palpable Palpable  Gastrointestinal: soft, non-distended. No guarding/no peritoneal signs.  Musculoskeletal: M/S 5/5 throughout.  arthritic deformity no significant atrophy.  Neurologic: CN 2-12 intact. Pain and light touch intact in extremities.  Symmetrical.  Speech is fluent. Motor exam as listed above. Psychiatric: Judgment intact, Mood & affect appropriate for pt's clinical situation. Dermatologic: No rashes or ulcers noted.  No changes consistent with cellulitis. Lymph : No Cervical lymphadenopathy, no lichenification or skin changes of chronic lymphedema.  CBC Lab Results  Component Value Date   WBC 6.0 10/27/2015   HGB 13.6 10/27/2015   HCT 39.2 10/27/2015   MCV 81.2 10/27/2015   PLT 236 10/27/2015    BMET    Component Value Date/Time   NA 139 10/27/2015 0459   NA 139 08/11/2013 1137   K 3.5 10/27/2015 0459   K 3.2 (L) 09/09/2013 1645   CL 107 10/27/2015 0459   CL 105 08/11/2013 1137   CO2 23 10/27/2015 0459   CO2 26 08/11/2013 1137   GLUCOSE 162 (H) 10/27/2015 0459   GLUCOSE 172 (H) 08/11/2013 1137   BUN 14 10/27/2015 0459   BUN 14 08/11/2013 1137   CREATININE 0.59 10/27/2015 0459   CREATININE 0.77 08/11/2013 1137   CALCIUM 9.0 10/27/2015 0459   CALCIUM 9.0 08/11/2013 1137   GFRNONAA >60 10/27/2015 0459   GFRNONAA >60 08/11/2013 1137   GFRAA >60 10/27/2015 0459   GFRAA >60 08/11/2013 1137   CrCl cannot be calculated (Patient's most recent lab result is older than the maximum 21 days allowed.).  COAG Lab Results  Component Value Date   INR 0.9 07/22/2007    Radiology Mm 3d Screen Breast Bilateral  Result Date: 07/11/2018 CLINICAL DATA:  Screening. EXAM: DIGITAL SCREENING BILATERAL MAMMOGRAM WITH TOMO AND CAD COMPARISON:  Previous exam(s). ACR Breast Density Category b: There are scattered areas of fibroglandular  density. FINDINGS: There are no findings suspicious for malignancy. Images were processed with CAD. IMPRESSION: No mammographic evidence of malignancy. A result letter of this screening mammogram will be mailed directly to the patient. RECOMMENDATION: Screening mammogram in one year. (Code:SM-B-01Y) BI-RADS CATEGORY  1: Negative. Electronically Signed   By: Kristopher Oppenheim M.D.   On: 07/11/2018 16:31     Assessment/Plan 1. History of pulmonary embolus (PE) The patient will continue anticoagulation for now as there have not been any problems or complications at this point.  IVC filter is strongly indicated prior to high risk orthopedic surgery.  Especially given the history of PE with the past DVT.  IVC filter placement will be done the week for surgery. Risk and benefits were reviewed the patient.  Indications for the procedure were reviewed.  All questions were answered, the patient agrees to proceed.   Left TKR surgery is scheduled for 10/14/2018.  Therefore the IVC filter will be placed 10/11/2018.  I have had a long discussion with the patient regarding DVT and post phlebitic changes such as swelling and why it  causes symptoms such as pain.  The patient will wear graduated compression stockings class 1 (20-30 mmHg) on a daily basis a prescription was given. The patient will  beginning wearing the stockings first thing in the morning and removing them in the evening. The patient is instructed specifically not to sleep in the stockings.  In addition, behavioral modification including elevation during the day and avoidance of prolonged dependency will be initiated.    The patient will follow-up with me in 3 months after the joint replacement surgery to discuss removal (this was also discussed today and the patient agrees with the plan to have the filter removed).    2. Primary osteoarthritis of left knee Plan for TKR October 14, 2018  3. Chronic pain of left knee See #2  4. Mixed  hyperlipidemia Continue statin as ordered and reviewed, no changes at this time   5. Essential hypertension Continue antihypertensive medications as already ordered, these medications have been reviewed and there are no changes at this time.  Hortencia Pilar, MD  08/04/2018 3:24 PM

## 2018-09-02 ENCOUNTER — Encounter (INDEPENDENT_AMBULATORY_CARE_PROVIDER_SITE_OTHER): Payer: Self-pay | Admitting: Vascular Surgery

## 2018-09-02 ENCOUNTER — Encounter (INDEPENDENT_AMBULATORY_CARE_PROVIDER_SITE_OTHER): Payer: BC Managed Care – PPO

## 2018-09-02 ENCOUNTER — Ambulatory Visit (INDEPENDENT_AMBULATORY_CARE_PROVIDER_SITE_OTHER): Payer: BC Managed Care – PPO

## 2018-09-02 ENCOUNTER — Ambulatory Visit (INDEPENDENT_AMBULATORY_CARE_PROVIDER_SITE_OTHER): Payer: BC Managed Care – PPO | Admitting: Vascular Surgery

## 2018-09-02 ENCOUNTER — Other Ambulatory Visit (INDEPENDENT_AMBULATORY_CARE_PROVIDER_SITE_OTHER): Payer: Self-pay | Admitting: Vascular Surgery

## 2018-09-02 VITALS — BP 145/84 | HR 77 | Resp 16 | Ht 67.0 in | Wt 187.4 lb

## 2018-09-02 DIAGNOSIS — I2782 Chronic pulmonary embolism: Secondary | ICD-10-CM

## 2018-09-02 DIAGNOSIS — I10 Essential (primary) hypertension: Secondary | ICD-10-CM | POA: Diagnosis not present

## 2018-09-02 DIAGNOSIS — Z01810 Encounter for preprocedural cardiovascular examination: Secondary | ICD-10-CM

## 2018-09-02 DIAGNOSIS — Z86711 Personal history of pulmonary embolism: Secondary | ICD-10-CM | POA: Diagnosis not present

## 2018-09-02 DIAGNOSIS — Z7901 Long term (current) use of anticoagulants: Secondary | ICD-10-CM

## 2018-09-02 DIAGNOSIS — Z86718 Personal history of other venous thrombosis and embolism: Secondary | ICD-10-CM

## 2018-09-02 DIAGNOSIS — Z79899 Other long term (current) drug therapy: Secondary | ICD-10-CM

## 2018-09-02 DIAGNOSIS — M1712 Unilateral primary osteoarthritis, left knee: Secondary | ICD-10-CM

## 2018-09-02 DIAGNOSIS — E119 Type 2 diabetes mellitus without complications: Secondary | ICD-10-CM

## 2018-09-02 DIAGNOSIS — Z7984 Long term (current) use of oral hypoglycemic drugs: Secondary | ICD-10-CM

## 2018-09-02 NOTE — Progress Notes (Signed)
MRN : [id number]  Alisha Sanchez is a 63 y.o. (02/29/1956) female who presents with chief complaint of  Chief Complaint  Patient presents with  . Follow-up    48month dvt  .  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  PE was identified years ago, (2009) and was treated with anticoagulation.  The presenting symptoms were pain and swelling in the lower extremity.  The patient notes the leg continues to be mildly painful with dependency and still swells quite a bite.  Symptoms are much better with elevation.  The patient notes minimal edema in the morning which steadily worsens throughout the day.    The patient has not been using compression therapy at this point.  No SOB or pleuritic chest pains.  No cough or hemoptysis.  No blood per rectum or blood in any sputum.  No excessive bruising per the patient.   Duplex ultrasound of the venous system is negative for chronic DVT changes, no acute DVT noted.  Current Meds  Medication Sig  . alendronate (FOSAMAX) 70 MG tablet Take 70 mg by mouth once a week. Take with a full glass of water on an empty stomach.  Marland Kitchen amLODipine (NORVASC) 5 MG tablet Take 5 mg by mouth daily.  . Cyanocobalamin (VITAMIN B-12 PO) Take by mouth daily.  . hydrochlorothiazide (HYDRODIURIL) 25 MG tablet Take 25 mg by mouth daily.  Marland Kitchen ibuprofen (ADVIL,MOTRIN) 200 MG tablet Take 400 mg by mouth every 6 (six) hours as needed.  . meclizine (ANTIVERT) 25 MG tablet Take 25 mg by mouth 3 (three) times daily as needed for dizziness.  . Multiple Vitamins-Minerals (MULTIVITAMIN ADULT PO) Take by mouth daily.  . ondansetron (ZOFRAN) 4 MG tablet Take 1-2 tabs by mouth every 8 hours as needed for nausea/vomiting  . pantoprazole (PROTONIX) 40 MG tablet Take 40 mg by mouth daily.  . rosuvastatin (CRESTOR) 10 MG tablet Take 10 mg by mouth daily.  .  sitaGLIPtin-metformin (JANUMET) 50-500 MG tablet Take 1 tablet by mouth 2 (two) times daily with a meal.  . SYNTHROID 125 MCG tablet TK 1 T PO ONCE DAILY ON AN EMPTY STOMACH WITH A GLASS OF WATER AT LEAST 30 TO 60 MINUTES BEFORE BRE  . TURMERIC PO Take by mouth daily.    Past Medical History:  Diagnosis Date  . Arthritis    knees  . Cancer ([place])    Thyroid  . Diabetes mellitus type 2 in obese ([place])   . GERD (gastroesophageal reflux disease)   . Headache    migraines - 3-4x/yr  . HLD (hyperlipidemia)   . HTN (hypertension)   . Hypothyroidism    no thyroid  . Localized swelling of both lower legs    leaky veins  . Neck pain    left side  . Numbness and tingling    numbness in hands and face tingles  . Pulmonary embolus ([place])    after thyroid surgery  . Vertigo    no episodes in approx 2 yrs    Past Surgical History:  Procedure Laterality Date  . c-sections     x2   . CATARACT EXTRACTION W/PHACO Left 08/27/2017   Procedure: CATARACT EXTRACTION PHACO AND INTRAOCULAR LENS PLACEMENT ([place]) LEFT DIABETIC;  Surgeon: Leandrew Koyanagi, MD;  Location: [facility];  Service: Ophthalmology;  Laterality: Left;  Diabetic - oral meds  .  COLONOSCOPY N/A 11/10/2015   Procedure: COLONOSCOPY;  Surgeon: Hulen Luster, MD;  Location: [facility];  Service: Gastroenterology;  Laterality: N/A;  . ESOPHAGOGASTRODUODENOSCOPY N/A 11/10/2015   Procedure: ESOPHAGOGASTRODUODENOSCOPY (EGD) with dialation;  Surgeon: Hulen Luster, MD;  Location: [place];  Service: Gastroenterology;  Laterality: N/A;  . THYROIDECTOMY      Social History Social History   Tobacco Use  . Smoking status: Never Smoker  . Smokeless tobacco: Never Used  . Tobacco comment: tobacco use- no  Substance Use Topics  . Alcohol use: Yes    Alcohol/week: 1.0 standard drinks    Types: 1 Glasses of wine per week    Comment: occassional wine  . Drug use: No    Family History Family History  Problem  Relation Age of Onset  . Liver cancer Father   . Lung cancer Father   . Diabetes Father   . Stroke Other   . Breast cancer Maternal Aunt 32  . Breast cancer Paternal Aunt 15    No Known Allergies   REVIEW OF SYSTEMS (Negative unless checked)  Constitutional: [] Weight loss  [] Fever  [] Chills Cardiac: [] Chest pain   [] Chest pressure   [] Palpitations   [] Shortness of breath when laying flat   [] Shortness of breath with exertion. Vascular:  [] Pain in legs with walking   [] Pain in legs at rest  [x] History of PE   [] Phlebitis   [] Swelling in legs   [] Varicose veins   [] Non-healing ulcers Pulmonary:   [] Uses home oxygen   [] Productive cough   [] Hemoptysis   [] Wheeze  [] COPD   [] Asthma Neurologic:  [] Dizziness   [] Seizures   [] History of stroke   [] History of TIA  [] Aphasia   [] Vissual changes   [] Weakness or numbness in arm   [] Weakness or numbness in leg Musculoskeletal:   [] Joint swelling   [x] Joint pain   [] Low back pain Hematologic:  [] Easy bruising  [] Easy bleeding   [] Hypercoagulable state   [] Anemic Gastrointestinal:  [] Diarrhea   [] Vomiting  [] Gastroesophageal reflux/heartburn   [] Difficulty swallowing. Genitourinary:  [] Chronic kidney disease   [] Difficult urination  [] Frequent urination   [] Blood in urine Skin:  [] Rashes   [] Ulcers  Psychological:  [] History of anxiety   []  History of major depression.  Physical Examination  Vitals:   09/02/18 1159  BP: (!) 145/84  Pulse: 77  Resp: 16  Weight: 187 lb 6.4 oz (85 kg)  Height: 5\' 7"  (1.702 m)   Body mass index is 29.35 kg/m. Gen: WD/WN, NAD Head: [place]/AT, No temporalis wasting.  Ear/Nose/Throat: Hearing grossly intact, nares w/o erythema or drainage Eyes: PER, EOMI, sclera nonicteric.  Neck: Supple, no large masses.   Pulmonary:  Good air movement, no audible wheezing bilaterally, no use of accessory muscles.  Cardiac: RRR, no JVD Vascular:  Vessel Right Left  Radial Palpable Palpable  Gastrointestinal: Non-distended. No  guarding/no peritoneal signs.  Musculoskeletal: M/S 5/5 throughout.  No deformity or atrophy.  Neurologic: CN 2-12 intact. Symmetrical.  Speech is fluent. Motor exam as listed above. Psychiatric: Judgment intact, Mood & affect appropriate for pt's clinical situation. Dermatologic: No rashes or ulcers noted.  No changes consistent with cellulitis. Lymph : No lichenification or skin changes of chronic lymphedema.  CBC Lab Results  Component Value Date   WBC 6.0 10/27/2015   HGB 13.6 10/27/2015   HCT 39.2 10/27/2015   MCV 81.2 10/27/2015   PLT 236 10/27/2015    BMET    Component Value Date/Time  NA 139 10/27/2015 0459   NA 139 08/11/2013 1137   K 3.5 10/27/2015 0459   K 3.2 (L) 09/09/2013 1645   CL 107 10/27/2015 0459   CL 105 08/11/2013 1137   CO2 23 10/27/2015 0459   CO2 26 08/11/2013 1137   GLUCOSE 162 (H) 10/27/2015 0459   GLUCOSE 172 (H) 08/11/2013 1137   BUN 14 10/27/2015 0459   BUN 14 08/11/2013 1137   CREATININE 0.59 10/27/2015 0459   CREATININE 0.77 08/11/2013 1137   CALCIUM 9.0 10/27/2015 0459   CALCIUM 9.0 08/11/2013 1137   GFRNONAA >60 10/27/2015 0459   GFRNONAA >60 08/11/2013 1137   GFRAA >60 10/27/2015 0459   GFRAA >60 08/11/2013 1137   CrCl cannot be calculated (Patient's most recent lab result is older than the maximum 21 days allowed.).  COAG Lab Results  Component Value Date   INR 0.9 07/22/2007    Radiology No results found.  Assessment/Plan 1. Chronic pulmonary embolism without acute cor pulmonale, unspecified pulmonary embolism type [facility]) The patient will continue anticoagulation for now as there have not been any problems or complications at this point.  IVC filter is strongly indicated prior to high risk orthopedic surgery.  Especially given the history of PE with the past DVT.  IVC filter placement will be done the week for surgery. Risk and benefits were reviewed the patient.  Indications for the procedure were reviewed.  All questions  were answered, the patient agrees to proceed.   Left TKR surgery is scheduled for 10/14/2018.  Therefore the IVC filter will be placed 10/11/2018.  I have had a long discussion with the patient regarding DVT and post phlebitic changes such as swelling and why it  causes symptoms such as pain.  The patient will wear graduated compression stockings class 1 (20-30 mmHg) on a daily basis a prescription was given. The patient will  beginning wearing the stockings first thing in the morning and removing them in the evening. The patient is instructed specifically not to sleep in the stockings.  In addition, behavioral modification including elevation during the day and avoidance of prolonged dependency will be initiated.    The patient will follow-up with me in 3 months after the joint replacement surgery to discuss removal (this was also discussed today and the patient agrees with the plan to have the filter removed).   - Antithrombin III - Protein C activity - Protein C, total - Protein S activity - Protein S, total - Lupus anticoagulant panel - Beta-2-glycoprotein i abs, IgG/M/A - Homocysteine, serum - Factor 5 leiden - Prothrombin gene mutation - Cardiolipin antibodies, IgG, IgM, IgA  2. Primary osteoarthritis of left knee Left TKR surgery is scheduled for 10/14/2018.   3. Essential hypertension Continue antihypertensive medications as already ordered, these medications have been reviewed and there are no changes at this time.   4. Type 2 diabetes mellitus without complication, without long-term current use of insulin (HCC) Continue hypoglycemic medications as already ordered, these medications have been reviewed and there are no changes at this time.  Hgb A1C to be monitored as already arranged by primary service     Hortencia Pilar, MD  09/02/2018 12:45 PM

## 2018-09-03 ENCOUNTER — Encounter (INDEPENDENT_AMBULATORY_CARE_PROVIDER_SITE_OTHER): Payer: Self-pay

## 2018-09-03 ENCOUNTER — Telehealth (INDEPENDENT_AMBULATORY_CARE_PROVIDER_SITE_OTHER): Payer: Self-pay

## 2018-09-03 ENCOUNTER — Ambulatory Visit (INDEPENDENT_AMBULATORY_CARE_PROVIDER_SITE_OTHER): Payer: BC Managed Care – PPO | Admitting: Nurse Practitioner

## 2018-09-03 ENCOUNTER — Encounter (INDEPENDENT_AMBULATORY_CARE_PROVIDER_SITE_OTHER): Payer: BC Managed Care – PPO

## 2018-09-03 NOTE — Telephone Encounter (Signed)
Spoke with the patient and gave her the day and time of her IVC filter placement with Dr. Delana Meyer. Patient is scheduled for 10/11/2018 with an arrival time of 6:45 am. This information will be mailed out to the patient.

## 2018-09-05 ENCOUNTER — Other Ambulatory Visit: Payer: Self-pay

## 2018-09-05 ENCOUNTER — Other Ambulatory Visit
Admission: RE | Admit: 2018-09-05 | Discharge: 2018-09-05 | Disposition: A | Discharge status: Home / Self Care | Payer: BC Managed Care – PPO | Source: Ambulatory Visit | Attending: Vascular Surgery | Admitting: Vascular Surgery

## 2018-09-05 DIAGNOSIS — I2782 Chronic pulmonary embolism: Secondary | ICD-10-CM | POA: Insufficient documentation

## 2018-09-05 LAB — ANTITHROMBIN III: AntiThromb III Func: 112 % (ref 75–120)

## 2018-09-06 LAB — LUPUS ANTICOAGULANT PANEL
DRVVT: 32.5 s (ref 0.0–47.0)
PTT Lupus Anticoagulant: 31.9 s (ref 0.0–51.9)

## 2018-09-06 LAB — BETA-2-GLYCOPROTEIN I ABS, IGG/M/A
Beta-2-Glycoprotein I IgA: <9 GPI IgA units (ref 0–25)
Beta-2-Glycoprotein I IgM: <9 GPI IgM units (ref 0–32)

## 2018-09-06 LAB — CARDIOLIPIN ANTIBODIES, IGG, IGM, IGA
Anticardiolipin IgA: <9 APL U/mL (ref 0–11)
Anticardiolipin IgG: <9 GPL U/mL (ref 0–14)
Anticardiolipin IgM: <9 MPL U/mL (ref 0–12)

## 2018-09-06 LAB — HOMOCYSTEINE: Homocysteine: 8.6 umol/L (ref 0.0–17.2)

## 2018-09-06 LAB — PROTEIN S ACTIVITY: Protein S Activity: 117 % (ref 63–140)

## 2018-09-06 LAB — PROTEIN C ACTIVITY: Protein C Activity: 157 % (ref 73–180)

## 2018-09-06 LAB — PROTEIN S, TOTAL: Protein S Ag, Total: 108 % (ref 60–150)

## 2018-09-06 LAB — PROTEIN C, TOTAL: Protein C, Total: 136 % (ref 60–150)

## 2018-09-10 LAB — FACTOR 5 LEIDEN

## 2018-09-11 LAB — PROTHROMBIN GENE MUTATION

## 2018-09-18 ENCOUNTER — Telehealth (INDEPENDENT_AMBULATORY_CARE_PROVIDER_SITE_OTHER): Payer: Self-pay | Admitting: Vascular Surgery

## 2018-09-18 NOTE — Telephone Encounter (Signed)
Called patient to notify surgery's being canceled. She still has her knee surgery scheduled for April. We will keep her on surgery for IVC filter placement until that procedure is canceled. Patient is aware. This is per Mickel Baas. AS, CMA

## 2018-09-19 ENCOUNTER — Other Ambulatory Visit: Payer: Self-pay | Admitting: Orthopedic Surgery

## 2018-09-19 ENCOUNTER — Other Ambulatory Visit (HOSPITAL_COMMUNITY): Payer: Self-pay | Admitting: Orthopedic Surgery

## 2018-09-19 DIAGNOSIS — W19XXXA Unspecified fall, initial encounter: Secondary | ICD-10-CM

## 2018-09-19 DIAGNOSIS — R29898 Other symptoms and signs involving the musculoskeletal system: Secondary | ICD-10-CM

## 2018-09-19 DIAGNOSIS — M25512 Pain in left shoulder: Secondary | ICD-10-CM

## 2018-09-23 ENCOUNTER — Telehealth (INDEPENDENT_AMBULATORY_CARE_PROVIDER_SITE_OTHER): Payer: Self-pay

## 2018-09-23 NOTE — Telephone Encounter (Signed)
Patient called and left a message stated that her knee surgery has been postponed to the middle of May. Patient asked that her IVC filter insertion be canceled and she will call back after she is rescheduled for her knee surgery. This will be canceled.

## 2018-10-07 ENCOUNTER — Ambulatory Visit (INDEPENDENT_AMBULATORY_CARE_PROVIDER_SITE_OTHER): Payer: BC Managed Care – PPO | Admitting: Vascular Surgery

## 2018-10-10 ENCOUNTER — Ambulatory Visit: Payer: BC Managed Care – PPO | Admitting: Student in an Organized Health Care Education/Training Program

## 2018-10-11 ENCOUNTER — Ambulatory Visit: Admit: 2018-10-11 | Payer: BC Managed Care – PPO | Admitting: Vascular Surgery

## 2018-10-11 SURGERY — IVC FILTER INSERTION
Anesthesia: Moderate Sedation

## 2018-10-22 ENCOUNTER — Other Ambulatory Visit (HOSPITAL_COMMUNITY): Payer: Self-pay | Admitting: Student

## 2018-10-22 ENCOUNTER — Other Ambulatory Visit: Payer: Self-pay | Admitting: Student

## 2018-10-22 ENCOUNTER — Ambulatory Visit: Payer: BC Managed Care – PPO

## 2018-10-22 DIAGNOSIS — S46012S Strain of muscle(s) and tendon(s) of the rotator cuff of left shoulder, sequela: Secondary | ICD-10-CM

## 2018-10-23 ENCOUNTER — Ambulatory Visit
Admission: RE | Admit: 2018-10-23 | Discharge: 2018-10-23 | Disposition: A | Discharge status: Home / Self Care | Payer: BC Managed Care – PPO | Source: Ambulatory Visit | Attending: Student | Admitting: Student

## 2018-10-23 ENCOUNTER — Other Ambulatory Visit: Payer: Self-pay

## 2018-10-23 DIAGNOSIS — S46012S Strain of muscle(s) and tendon(s) of the rotator cuff of left shoulder, sequela: Secondary | ICD-10-CM | POA: Diagnosis not present

## 2018-10-30 DIAGNOSIS — M7582 Other shoulder lesions, left shoulder: Secondary | ICD-10-CM | POA: Insufficient documentation

## 2018-10-30 DIAGNOSIS — S46012A Strain of muscle(s) and tendon(s) of the rotator cuff of left shoulder, initial encounter: Secondary | ICD-10-CM | POA: Insufficient documentation

## 2018-11-04 ENCOUNTER — Telehealth (INDEPENDENT_AMBULATORY_CARE_PROVIDER_SITE_OTHER): Payer: Self-pay

## 2018-11-04 NOTE — Telephone Encounter (Signed)
I spoke with the patient regarding getting her scheduled for a IVC placement. The patient states that her knee surgery has not been rescheduled as of yet and when it does she will call to reschedule her filter placement.

## 2018-11-12 ENCOUNTER — Other Ambulatory Visit
Admission: RE | Admit: 2018-11-12 | Discharge: 2018-11-12 | Disposition: A | Discharge status: Home / Self Care | Payer: BC Managed Care – PPO | Source: Ambulatory Visit | Attending: Surgery | Admitting: Surgery

## 2018-11-12 ENCOUNTER — Encounter: Payer: Self-pay | Admitting: *Deleted

## 2018-11-12 ENCOUNTER — Other Ambulatory Visit: Payer: Self-pay

## 2018-11-12 DIAGNOSIS — Z1159 Encounter for screening for other viral diseases: Secondary | ICD-10-CM | POA: Insufficient documentation

## 2018-11-13 LAB — NOVEL CORONAVIRUS, NAA (HOSP ORDER, SEND-OUT TO REF LAB; TAT 18-24 HRS): SARS-CoV-2, NAA: NOT DETECTED

## 2018-11-15 ENCOUNTER — Encounter
Admission: RE | Disposition: A | Discharge status: Home / Self Care | Payer: Self-pay | Source: Home / Self Care | Attending: Surgery

## 2018-11-15 ENCOUNTER — Ambulatory Visit
Admission: RE | Admit: 2018-11-15 | Discharge: 2018-11-15 | Disposition: A | Discharge status: Home / Self Care | Payer: BC Managed Care – PPO | Attending: Surgery | Admitting: Surgery

## 2018-11-15 ENCOUNTER — Ambulatory Visit: Payer: BC Managed Care – PPO | Admitting: Anesthesiology

## 2018-11-15 DIAGNOSIS — M19012 Primary osteoarthritis, left shoulder: Secondary | ICD-10-CM | POA: Diagnosis not present

## 2018-11-15 DIAGNOSIS — Z85850 Personal history of malignant neoplasm of thyroid: Secondary | ICD-10-CM | POA: Diagnosis not present

## 2018-11-15 DIAGNOSIS — Y9389 Activity, other specified: Secondary | ICD-10-CM | POA: Diagnosis not present

## 2018-11-15 DIAGNOSIS — E1140 Type 2 diabetes mellitus with diabetic neuropathy, unspecified: Secondary | ICD-10-CM | POA: Insufficient documentation

## 2018-11-15 DIAGNOSIS — E785 Hyperlipidemia, unspecified: Secondary | ICD-10-CM | POA: Diagnosis not present

## 2018-11-15 DIAGNOSIS — M65812 Other synovitis and tenosynovitis, left shoulder: Secondary | ICD-10-CM | POA: Diagnosis not present

## 2018-11-15 DIAGNOSIS — Z7984 Long term (current) use of oral hypoglycemic drugs: Secondary | ICD-10-CM | POA: Insufficient documentation

## 2018-11-15 DIAGNOSIS — Z86718 Personal history of other venous thrombosis and embolism: Secondary | ICD-10-CM | POA: Diagnosis not present

## 2018-11-15 DIAGNOSIS — Z7982 Long term (current) use of aspirin: Secondary | ICD-10-CM | POA: Insufficient documentation

## 2018-11-15 DIAGNOSIS — W108XXA Fall (on) (from) other stairs and steps, initial encounter: Secondary | ICD-10-CM | POA: Insufficient documentation

## 2018-11-15 DIAGNOSIS — Z79890 Hormone replacement therapy (postmenopausal): Secondary | ICD-10-CM | POA: Diagnosis not present

## 2018-11-15 DIAGNOSIS — Z79899 Other long term (current) drug therapy: Secondary | ICD-10-CM | POA: Diagnosis not present

## 2018-11-15 DIAGNOSIS — K219 Gastro-esophageal reflux disease without esophagitis: Secondary | ICD-10-CM | POA: Diagnosis not present

## 2018-11-15 DIAGNOSIS — E890 Postprocedural hypothyroidism: Secondary | ICD-10-CM | POA: Diagnosis not present

## 2018-11-15 DIAGNOSIS — I10 Essential (primary) hypertension: Secondary | ICD-10-CM | POA: Diagnosis not present

## 2018-11-15 DIAGNOSIS — Z7983 Long term (current) use of bisphosphonates: Secondary | ICD-10-CM | POA: Insufficient documentation

## 2018-11-15 DIAGNOSIS — Z791 Long term (current) use of non-steroidal anti-inflammatories (NSAID): Secondary | ICD-10-CM | POA: Diagnosis not present

## 2018-11-15 DIAGNOSIS — M94212 Chondromalacia, left shoulder: Secondary | ICD-10-CM | POA: Insufficient documentation

## 2018-11-15 DIAGNOSIS — M948X1 Other specified disorders of cartilage, shoulder: Secondary | ICD-10-CM | POA: Insufficient documentation

## 2018-11-15 DIAGNOSIS — Y9222 Religious institution as the place of occurrence of the external cause: Secondary | ICD-10-CM | POA: Insufficient documentation

## 2018-11-15 DIAGNOSIS — S46012A Strain of muscle(s) and tendon(s) of the rotator cuff of left shoulder, initial encounter: Secondary | ICD-10-CM | POA: Insufficient documentation

## 2018-11-15 HISTORY — DX: Nausea with vomiting, unspecified: R11.2

## 2018-11-15 HISTORY — DX: Nausea with vomiting, unspecified: Z98.890

## 2018-11-15 HISTORY — PX: SHOULDER ARTHROSCOPY: SHX128

## 2018-11-15 HISTORY — DX: Other complications of anesthesia, initial encounter: T88.59XA

## 2018-11-15 LAB — GLUCOSE, CAPILLARY
Glucose-Capillary: 173 mg/dL — ABNORMAL HIGH (ref 70–99)
Glucose-Capillary: 215 mg/dL — ABNORMAL HIGH (ref 70–99)

## 2018-11-15 SURGERY — ARTHROSCOPY, SHOULDER
Anesthesia: Regional | Site: Shoulder | Laterality: Left

## 2018-11-15 MED ORDER — MIDAZOLAM HCL 2 MG/2ML IJ SOLN
INTRAMUSCULAR | Status: DC | PRN
  Administered 2018-11-15: 2 mg via INTRAVENOUS

## 2018-11-15 MED ORDER — OXYCODONE HCL 5 MG/5ML PO SOLN: 5.0000 mg | Freq: Once | ORAL | Status: DC | PRN

## 2018-11-15 MED ORDER — CEFAZOLIN SODIUM-DEXTROSE 2-4 GM/100ML-% IV SOLN: 2.0000 g | Freq: Once | INTRAVENOUS | Status: DC

## 2018-11-15 MED ORDER — LIDOCAINE-EPINEPHRINE 1 %-1:100000 IJ SOLN
INTRAMUSCULAR | Status: DC | PRN
  Administered 2018-11-15: 20 mL

## 2018-11-15 MED ORDER — PROPOFOL 10 MG/ML IV BOLUS
INTRAVENOUS | Status: DC | PRN
  Administered 2018-11-15: 120 mg via INTRAVENOUS

## 2018-11-15 MED ORDER — LACTATED RINGERS IV SOLN
INTRAVENOUS | Status: DC
  Administered 2018-11-15 (×2): via INTRAVENOUS

## 2018-11-15 MED ORDER — BUPIVACAINE LIPOSOME 1.3 % IJ SUSP
INTRAMUSCULAR | Status: DC | PRN
  Administered 2018-11-15 (×2): 10 mL

## 2018-11-15 MED ORDER — GLYCOPYRROLATE 0.2 MG/ML IJ SOLN
INTRAMUSCULAR | Status: DC | PRN
  Administered 2018-11-15: 0.1 mg via INTRAVENOUS

## 2018-11-15 MED ORDER — OXYCODONE HCL 5 MG PO TABS: 5.0000 mg | ORAL_TABLET | Freq: Once | ORAL | Status: DC | PRN

## 2018-11-15 MED ORDER — SCOPOLAMINE 1 MG/3DAYS TD PT72
1.0000 | MEDICATED_PATCH | TRANSDERMAL | Status: DC
  Administered 2018-11-15: 10:00:00 1.5 mg via TRANSDERMAL

## 2018-11-15 MED ORDER — ONDANSETRON HCL 4 MG/2ML IJ SOLN
INTRAMUSCULAR | Status: DC | PRN
  Administered 2018-11-15: 4 mg via INTRAVENOUS

## 2018-11-15 MED ORDER — DEXAMETHASONE SODIUM PHOSPHATE 4 MG/ML IJ SOLN
INTRAMUSCULAR | Status: DC | PRN
  Administered 2018-11-15: 4 mg via INTRAVENOUS

## 2018-11-15 MED ORDER — LIDOCAINE HCL (CARDIAC) PF 100 MG/5ML IV SOSY
PREFILLED_SYRINGE | INTRAVENOUS | Status: DC | PRN
  Administered 2018-11-15: 40 mg via INTRATRACHEAL

## 2018-11-15 MED ORDER — BUPIVACAINE HCL (PF) 0.5 % IJ SOLN
INTRAMUSCULAR | Status: DC | PRN
  Administered 2018-11-15 (×2): 10 mL

## 2018-11-15 MED ORDER — FENTANYL CITRATE (PF) 100 MCG/2ML IJ SOLN
INTRAMUSCULAR | Status: DC | PRN
  Administered 2018-11-15 (×2): 25 ug via INTRAVENOUS
  Administered 2018-11-15: 100 ug via INTRAVENOUS

## 2018-11-15 MED ORDER — LACTATED RINGERS IV SOLN: INTRAVENOUS | Status: DC

## 2018-11-15 MED ORDER — OXYCODONE HCL 5 MG PO TABS: 5.0000 mg | ORAL_TABLET | ORAL | 0 refills | Status: DC | PRN

## 2018-11-15 SURGICAL SUPPLY — 42 items
ANCHOR JUGGERKNOT WTAP NDL 2.9 (Anchor) ×4 IMPLANT
ANCHOR SUT QUATTRO KNTLS 4.5 (Anchor) ×2 IMPLANT
ANCHOR SUT W/ ORTHOCORD (Anchor) ×1 IMPLANT
BIT DRILL JUGRKNT W/NDL BIT2.9 (DRILL) IMPLANT
BLADE FULL RADIUS 3.5 (BLADE) ×2 IMPLANT
BUR ACROMIONIZER 4.0 (BURR) ×2 IMPLANT
CANNULA SHAVER 8MMX76MM (CANNULA) ×3 IMPLANT
CHLORAPREP W/TINT 26 (MISCELLANEOUS) ×3 IMPLANT
COVER LIGHT HANDLE UNIVERSAL (MISCELLANEOUS) ×4 IMPLANT
COVER MAYO STAND STRL (DRAPES) ×2 IMPLANT
DRAPE IMP U-DRAPE 54X76 (DRAPES) ×4 IMPLANT
DRILL JUGGERKNOT W/NDL BIT 2.9 (DRILL) ×2
ELECT REM PT RETURN 9FT ADLT (ELECTROSURGICAL)
ELECTRODE REM PT RTRN 9FT ADLT (ELECTROSURGICAL) IMPLANT
GAUZE SPONGE 4X4 12PLY STRL (GAUZE/BANDAGES/DRESSINGS) ×2 IMPLANT
GAUZE XEROFORM 1X8 LF (GAUZE/BANDAGES/DRESSINGS) ×2 IMPLANT
GLOVE BIO SURGEON STRL SZ8 (GLOVE) ×6 IMPLANT
GLOVE INDICATOR 8.0 STRL GRN (GLOVE) ×4 IMPLANT
GOWN STRL REUS W/ TWL LRG LVL3 (GOWN DISPOSABLE) ×1 IMPLANT
GOWN STRL REUS W/ TWL XL LVL3 (GOWN DISPOSABLE) ×1 IMPLANT
GOWN STRL REUS W/TWL LRG LVL3 (GOWN DISPOSABLE) ×2
GOWN STRL REUS W/TWL XL LVL3 (GOWN DISPOSABLE) ×1
GRASPER SUT 15 45D LOW PRO (SUTURE) ×1 IMPLANT
IV LACTATED RINGER IRRG 3000ML (IV SOLUTION) ×2
IV LR IRRIG 3000ML ARTHROMATIC (IV SOLUTION) ×2 IMPLANT
MANIFOLD 4PT FOR NEPTUNE1 (MISCELLANEOUS) ×2 IMPLANT
MAT BLUE FLOOR 46X72 FLO (MISCELLANEOUS) ×2 IMPLANT
NDL HYPO 21X1.5 SAFETY (NEEDLE) ×1 IMPLANT
NDL REVERSE CUT 1/2 CRC (NEEDLE) ×1 IMPLANT
NEEDLE HYPO 21X1.5 SAFETY (NEEDLE) ×2 IMPLANT
NEEDLE REVERSE CUT 1/2 CRC (NEEDLE) ×2 IMPLANT
PACK ARTHROSCOPY SHOULDER (MISCELLANEOUS) ×2 IMPLANT
PENCIL SMOKE EVACUATOR (MISCELLANEOUS) ×1 IMPLANT
SLING ULTRA II M (MISCELLANEOUS) ×1 IMPLANT
STAPLER SKIN PROX 35W (STAPLE) ×2 IMPLANT
SUT ETHIBOND 0 MO6 C/R (SUTURE) ×1 IMPLANT
SUT VIC AB 2-0 CT1 27 (SUTURE) ×2
SUT VIC AB 2-0 CT1 TAPERPNT 27 (SUTURE) IMPLANT
TAPE MICROFOAM 4IN (TAPE) ×2 IMPLANT
TUBING ARTHRO INFLOW-ONLY STRL (TUBING) ×2 IMPLANT
TUBING CONNECTING 10 (TUBING) ×1 IMPLANT
WAND WEREWOLF FLOW 90D (MISCELLANEOUS) ×2 IMPLANT

## 2018-11-15 NOTE — Anesthesia Procedure Notes (Signed)
Anesthesia Regional Block: Interscalene brachial plexus block   Pre-Anesthetic Checklist: ,, timeout performed, Correct Patient, Correct Site, Correct Laterality, Correct Procedure, Correct Position, site marked, Risks and benefits discussed,  Surgical consent,  Pre-op evaluation,  At surgeon's request and post-op pain management  Laterality: Left  Prep: chloraprep       Needles:  Injection technique: Single-shot  Needle Type: Stimiplex     Needle Length: 10cm  Needle Gauge: 21     Additional Needles:   Procedures:,,,, ultrasound used (permanent image in chart),,,,  Narrative:  Start time: 11/15/2018 10:24 AM End time: 11/15/2018 10:29 AM Injection made incrementally with aspirations every 5 mL.  Performed by: Personally   Additional Notes: Functioning IV was confirmed and monitors applied. Ultrasound guidance: relevant anatomy identified, needle position confirmed, local anesthetic spread visualized around nerve(s)., vascular puncture avoided.  Image printed for medical record.  Negative aspiration and no paresthesias; incremental administration of local anesthetic. The patient tolerated the procedure well. Vitals signes recorded in RN notes.

## 2018-11-15 NOTE — H&P (Signed)
Paper H&P to be scanned into permanent record. H&P reviewed and patient re-examined. No changes. 

## 2018-11-15 NOTE — Anesthesia Preprocedure Evaluation (Signed)
Anesthesia Evaluation  Patient identified by MRN, date of birth, ID band  Reviewed: NPO status   History of Anesthesia Complications (+) PONV and history of anesthetic complications  Airway Mallampati: II  TM Distance: >3 FB Neck ROM: full    Dental no notable dental hx.    Pulmonary PE (2009) PE 2009    Pulmonary exam normal        Cardiovascular Exercise Tolerance: Good hypertension, Normal cardiovascular exam     Neuro/Psych  Headaches, Neuropathy hands negative psych ROS   GI/Hepatic Neg liver ROS, GERD  Controlled,  Endo/Other  diabetesHypothyroidism   Renal/GU negative Renal ROS  negative genitourinary   Musculoskeletal  (+) Arthritis ,   Abdominal   Peds  Hematology negative hematology ROS (+)   Anesthesia Other Findings covid NEG; Last aspirin: 5/13;   Reproductive/Obstetrics                             Anesthesia Physical Anesthesia Plan  ASA: II  Anesthesia Plan: General and Regional   Post-op Pain Management: GA combined w/ Regional for post-op pain   Induction:   PONV Risk Score and Plan:   Airway Management Planned: Natural Airway  Additional Equipment:   Intra-op Plan:   Post-operative Plan:   Informed Consent: I have reviewed the patients History and Physical, chart, labs and discussed the procedure including the risks, benefits and alternatives for the proposed anesthesia with the patient or authorized representative who has indicated his/her understanding and acceptance.       Plan Discussed with: CRNA  Anesthesia Plan Comments: (Exparel ISBlock.)        Anesthesia Quick Evaluation

## 2018-11-15 NOTE — Transfer of Care (Addendum)
Immediate Anesthesia Transfer of Care Note  Patient: Alisha Sanchez  Procedure(s) Performed: ARTHROSCOPY SHOULDER WITH DEBRIDEMENT DECOMPRESSION, ROTATOR CUFF REPAIR AND POSSIBLE BICEPS TENODESIS (Left Shoulder)  Patient Location: PACU  Anesthesia Type: General, Regional  Level of Consciousness: awake, alert  and patient cooperative  Airway and Oxygen Therapy: Patient Spontanous Breathing and Patient connected to supplemental oxygen  Post-op Assessment: Post-op Vital signs reviewed, Patient's Cardiovascular Status Stable, Respiratory Function Stable, Patent Airway and No signs of Nausea or vomiting  Post-op Vital Signs: Reviewed and stable  Complications: No apparent anesthesia complications

## 2018-11-15 NOTE — Anesthesia Procedure Notes (Signed)
Procedure Name: LMA Insertion Date/Time: 11/15/2018 12:16 PM Performed by: Mayme Genta, CRNA Pre-anesthesia Checklist: Patient identified, Emergency Drugs available, Suction available, Timeout performed and Patient being monitored Patient Re-evaluated:Patient Re-evaluated prior to induction Oxygen Delivery Method: Circle system utilized Preoxygenation: Pre-oxygenation with 100% oxygen Induction Type: IV induction LMA: LMA inserted LMA Size: 4.0 Number of attempts: 1 Placement Confirmation: positive ETCO2 and breath sounds checked- equal and bilateral Tube secured with: Tape

## 2018-11-15 NOTE — Anesthesia Postprocedure Evaluation (Signed)
Anesthesia Post Note  Patient: Alisha Sanchez  Procedure(s) Performed: ARTHROSCOPY SHOULDER WITH DEBRIDEMENT DECOMPRESSION, ROTATOR CUFF REPAIR AND POSSIBLE BICEPS TENODESIS (Left Shoulder)  Patient location during evaluation: PACU Anesthesia Type: Regional Level of consciousness: awake and alert Pain management: pain level controlled Vital Signs Assessment: post-procedure vital signs reviewed and stable Respiratory status: spontaneous breathing, nonlabored ventilation, respiratory function stable and patient connected to nasal cannula oxygen Cardiovascular status: blood pressure returned to baseline and stable Postop Assessment: no apparent nausea or vomiting Anesthetic complications: no    Freada [place]

## 2018-11-15 NOTE — Op Note (Signed)
11/15/2018  1:58 PM  Patient:   Alisha Sanchez  Pre-Op Diagnosis:   Traumatic massive full-thickness rotator cuff tear, left shoulder.  Post-Op Diagnosis:   Traumatic massive full-thickness rotator cuff tear with labral fraying, early degenerative joint disease, and biceps tendinopathy, left shoulder.  Procedure:   Extensive arthroscopic debridement, arthroscopic subacromial decompression, arthroscopic subscapularis tendon repair, mini-open repair of supraspinatus and infraspinatus tendons, and mini-open biceps tenodesis, left shoulder.  Anesthesia:   General LMA with interscalene block placed preoperatively by the anesthesiologist.  Surgeon:   Pascal Lux, MD  Assistant:   Cameron Proud, PA-C  Findings:   As above. There were full-thickness retracted tears of both the supraspinatus and infraspinatus tendons with retraction to the humeral head, as well as a partial thickness tear involving the superior portion of the subscapularis tendon. The biceps tendon demonstrated mild tendinopathic changes without partial or full-thickness tearing. There was fraying of the labrum superiorly and anterosuperiorly. There were grade 2 chondromalacial changes involving the anterosuperior portion of the humeral head. The glenoid articular surface was in satisfactory condition.  Complications:   None  Fluids:   1000 cc  Estimated blood loss:   10 cc  Tourniquet time:   None  Drains:   None  Closure:   Staples      Brief clinical note:   The patient is a 63 year old female with a 4-13-month history of left shoulder pain and weakness when she fell onto her left shoulder while leaving church one Sunday morning. The patient's symptoms have progressed despite medications, activity modification, etc. The patient's history and examination are consistent with a large rotator cuff tear. These findings were confirmed by MRI scan. The patient presents at this time for definitive management of these  shoulder symptoms.  Procedure:   The patient underwent placement of an interscalene block by the anesthesiologist in the preoperative holding area before being brought into the operating room and lain in the supine position. The patient then underwent general laryngeal mask anesthesia before being repositioned in the beach chair position using the beach chair positioner. The left shoulder and upper extremity were prepped with ChloraPrep solution before being draped sterilely. Preoperative antibiotics were administered. A timeout was performed to confirm the proper surgical site before the expected portal sites and incision site were injected with 0.5% Sensorcaine with epinephrine. A posterior portal was created and the glenohumeral joint thoroughly inspected with the findings as described above. An anterior portal was created using an outside-in technique. The labrum and rotator cuff were further probed, again confirming the above-noted findings. The areas of labral fraying were debrided back to stable margins using the full-radius resector, as were areas of synovitis and the area of focal chondromalacia involving the humeral head. In addition, the torn margins of the rotator cuff also were debrided back to stable margins using the full-radius resector. The ArthroCare wand was inserted and used to release the biceps tendon from its labral anchor. It also was used to obtain hemostasis as well as to "anneal" the labrum superiorly and anteriorly.   A separate superolateral portal site was created using an outside in technique before the subscapularis tendon tear was repaired arthroscopically using a single Mitek BioKnotless anchor placed through the anterior portal. The adequacy of repair was verified both by probing as well as with external rotation of the shoulder. The instruments were removed from the joint after suctioning the excess fluid.  The camera was repositioned through the posterior portal into the  subacromial space.  A separate lateral portal was created using an outside-in technique. The 3.5 mm full-radius resector was introduced and used to perform a subtotal bursectomy. The ArthroCare wand was then inserted and used to remove the periosteal tissue off the undersurface of the anterior third of the acromion as well as to recess the coracoacromial ligament from its attachment along the anterior and lateral margins of the acromion. The undersurface of the acromion was felt to be quite flat, so no formal bony resection was performed. The instruments were then removed from the subacromial space after suctioning the excess fluid.  An approximately 4-5 cm incision was made over the anterolateral aspect of the shoulder beginning at the anterolateral corner of the acromion and extending distally in line with the bicipital groove. This incision was carried down through the subcutaneous tissues to expose the deltoid fascia. The raphae between the anterior and middle thirds was identified and this plane developed to provide access into the subacromial space. Additional bursal tissues were debrided sharply using Metzenbaum scissors. The rotator cuff tear was readily identified. The margins were debrided sharply with a #15 blade and the exposed greater tuberosity roughened with a rongeur. The tear was repaired using three Biomet 2.9 mm JuggerKnot anchors. These sutures were then brought back laterally and secured using two [place] QuatroLink anchors to create a two-layer closure. Several additional #0 Ethibond interrupted sutures were placed to reinforce this repair. An apparent watertight closure was obtained.  The bicipital groove was identified by palpation and opened for 1-1.5 cm. The biceps tendon stump was retrieved through this defect. The floor of the bicipital groove was roughened with a curet before another Biomet 2.9 mm JuggerKnot anchor was inserted. Both sets of sutures were passed through the biceps  tendon and tied securely to effect the tenodesis. The bicipital sheath was reapproximated using two #0 Ethibond interrupted sutures, incorporating the biceps tendon to further reinforce the tenodesis.  The wound was copiously irrigated with sterile saline solution before the deltoid raphae was reapproximated using 2-0 Vicryl interrupted sutures. The subcutaneous tissues were closed in two layers using 2-0 Vicryl interrupted sutures before the skin was closed using staples. The portal sites also were closed using staples. A sterile bulky dressing was applied to the shoulder before the arm was placed into a shoulder immobilizer. The patient was then awakened, extubated, and returned to the recovery room in satisfactory condition after tolerating the procedure well.

## 2018-11-18 ENCOUNTER — Encounter: Payer: Self-pay | Admitting: Surgery

## 2018-11-18 DIAGNOSIS — M19012 Primary osteoarthritis, left shoulder: Secondary | ICD-10-CM | POA: Insufficient documentation

## 2019-02-10 ENCOUNTER — Telehealth (INDEPENDENT_AMBULATORY_CARE_PROVIDER_SITE_OTHER): Payer: Self-pay

## 2019-02-10 NOTE — Telephone Encounter (Signed)
Spoke with the patient regarding getting scheduled for her IVC filter placement. Patient is now scheduled with Dr. Lucky Cowboy for 03/17/2019 with a 6:45 am arrival time and will do her Covid test on 03/13/2019 between 12:30-2:30 pm. Pre-procedure instructions will be mailed to the patient.

## 2019-03-07 ENCOUNTER — Other Ambulatory Visit: Payer: Self-pay

## 2019-03-07 ENCOUNTER — Encounter
Admission: RE | Admit: 2019-03-07 | Discharge: 2019-03-07 | Disposition: A | Discharge status: Home / Self Care | Payer: BC Managed Care – PPO | Source: Ambulatory Visit | Attending: Orthopedic Surgery | Admitting: Orthopedic Surgery

## 2019-03-07 DIAGNOSIS — Z01818 Encounter for other preprocedural examination: Secondary | ICD-10-CM | POA: Insufficient documentation

## 2019-03-07 HISTORY — DX: Depression, unspecified: F32.A

## 2019-03-07 HISTORY — DX: Localized edema: R60.0

## 2019-03-07 HISTORY — DX: Dyspnea, unspecified: R06.00

## 2019-03-07 LAB — URINALYSIS, ROUTINE W REFLEX MICROSCOPIC
Bacteria, UA: NONE SEEN
Bilirubin Urine: NEGATIVE
Glucose, UA: >=500 mg/dL — AB
Hgb urine dipstick: NEGATIVE
Ketones, ur: NEGATIVE mg/dL
Leukocytes,Ua: NEGATIVE
Nitrite: NEGATIVE
Protein, ur: NEGATIVE mg/dL
Specific Gravity, Urine: 1.02 (ref 1.005–1.030)
pH: 5 (ref 5.0–8.0)

## 2019-03-07 LAB — COMPREHENSIVE METABOLIC PANEL
ALT: 18 U/L (ref 0–44)
AST: 19 U/L (ref 15–41)
Albumin: 4.4 g/dL (ref 3.5–5.0)
Alkaline Phosphatase: 71 U/L (ref 38–126)
Anion gap: 13 (ref 5–15)
BUN: 13 mg/dL (ref 8–23)
CO2: 26 mmol/L (ref 22–32)
Calcium: 9.5 mg/dL (ref 8.9–10.3)
Chloride: 99 mmol/L (ref 98–111)
Creatinine, Ser: 0.56 mg/dL (ref 0.44–1.00)
GFR calc Af Amer: >60 mL/min (ref >60)
GFR calc non Af Amer: >60 mL/min (ref >60)
Glucose, Bld: 263 mg/dL — ABNORMAL HIGH (ref 70–99)
Potassium: 3.4 mmol/L — ABNORMAL LOW (ref 3.5–5.1)
Sodium: 138 mmol/L (ref 135–145)
Total Bilirubin: 0.4 mg/dL (ref 0.3–1.2)
Total Protein: 7.7 g/dL (ref 6.5–8.1)

## 2019-03-07 LAB — CBC
HCT: 38.3 % (ref 36.0–46.0)
Hemoglobin: 13 g/dL (ref 12.0–15.0)
MCH: 27.3 pg (ref 26.0–34.0)
MCHC: 33.9 g/dL (ref 30.0–36.0)
MCV: 80.3 fL (ref 80.0–100.0)
Platelets: 350 10*3/uL (ref 150–400)
RBC: 4.77 MIL/uL (ref 3.87–5.11)
RDW: 13.7 % (ref 11.5–15.5)
WBC: 7 10*3/uL (ref 4.0–10.5)
nRBC: 0 % (ref 0.0–0.2)

## 2019-03-07 LAB — APTT: aPTT: 29 seconds (ref 24–36)

## 2019-03-07 LAB — TYPE AND SCREEN
ABO/RH(D): A POS
Antibody Screen: NEGATIVE

## 2019-03-07 LAB — HEMOGLOBIN A1C
Hgb A1c MFr Bld: 6.8 % — ABNORMAL HIGH (ref 4.8–5.6)
Mean Plasma Glucose: 148.46 mg/dL

## 2019-03-07 LAB — PROTIME-INR
INR: 0.9 (ref 0.8–1.2)
Prothrombin Time: 12.3 seconds (ref 11.4–15.2)

## 2019-03-07 LAB — SURGICAL PCR SCREEN
MRSA, PCR: NEGATIVE
Staphylococcus aureus: NEGATIVE

## 2019-03-07 LAB — C-REACTIVE PROTEIN: CRP: 2.1 mg/dL — ABNORMAL HIGH (ref <1.0)

## 2019-03-07 LAB — SEDIMENTATION RATE: Sed Rate: 13 mm/hr (ref 0–30)

## 2019-03-07 MED ORDER — ENSURE PRE-SURGERY PO LIQD
296.0000 mL | Freq: Once | ORAL | Status: DC
  Filled 2019-03-07: qty 296

## 2019-03-07 NOTE — Discharge Instructions (Signed)
°  Instructions after Total Knee Replacement ° ° Jamarien Rodkey P. Leroy Trim, Jr., M.D.    ° Dept. of Orthopaedics & Sports Medicine ° [facility] ° [address] ° [place], [place]  [address] ° Phone: [phone]   Fax: [phone] ° °  °DIET: °• Drink plenty of non-alcoholic fluids. °• Resume your normal diet. Include foods high in fiber. ° °ACTIVITY:  °• You may use crutches or a walker with weight-bearing as tolerated, unless instructed otherwise. °• You may be weaned off of the walker or crutches by your Physical Therapist.  °• Do NOT place pillows under the knee. Anything placed under the knee could limit your ability to straighten the knee.   °• Continue doing gentle exercises. Exercising will reduce the pain and swelling, increase motion, and prevent muscle weakness.   °• Please continue to use the TED compression stockings for 6 weeks. You may remove the stockings at night, but should reapply them in the morning. °• Do not drive or operate any equipment until instructed. ° °WOUND CARE:  °• Continue to use the PolarCare or ice packs periodically to reduce pain and swelling. °• You may bathe or shower after the staples are removed at the first office visit following surgery. ° °MEDICATIONS: °• You may resume your regular medications. °• Please take the pain medication as prescribed on the medication. °• Do not take pain medication on an empty stomach. °• You have been given a prescription for a blood thinner (Lovenox or Coumadin). Please take the medication as instructed. (NOTE: After completing a 2 week course of Lovenox, take one Enteric-coated aspirin once a day. This along with elevation will help reduce the possibility of phlebitis in your operated leg.) °• Do not drive or drink alcoholic beverages when taking pain medications. ° °CALL THE OFFICE FOR: °• Temperature above 101 degrees °• Excessive bleeding or drainage on the dressing. °• Excessive swelling, coldness, or paleness of the toes. °• Persistent  nausea and vomiting. ° °FOLLOW-UP:  °• You should have an appointment to return to the office in 10-14 days after surgery. °• Arrangements have been made for continuation of Physical Therapy (either home therapy or outpatient therapy). °  °

## 2019-03-07 NOTE — Patient Instructions (Addendum)
Your procedure is scheduled on: 03/24/2019 Mon Report to Same Day Surgery 2nd floor medical mall [facility] Entrance-take elevator on left to 2nd floor.  Check in with surgery information desk.) To find out your arrival time please call [phone] between 1PM - 3PM on 03/21/2019 Fri  Remember: Instructions that are not followed completely may result in serious medical risk, up to and including death, or upon the discretion of your surgeon and anesthesiologist your surgery may need to be rescheduled.    _x___ 1. Do not eat food after midnight the night before your procedure. You may drink clear liquids up to 2 hours before you are scheduled to arrive at the hospital for your procedure.  Do not drink clear liquids within 2 hours of your scheduled arrival to the hospital.  Clear liquids include  --Water or Apple juice without pulp  --Clear carbohydrate beverage such as ClearFast or Gatorade  --Black Coffee or Clear Tea (No milk, no creamers, do not add anything to                  the coffee or Tea Type 1 and type 2 diabetics should only drink water.   ____Ensure clear carbohydrate drink on the way to the hospital for bariatric patients  x____Ensure clear carbohydrate drink 3 hours before surgery. Complete 1.5 hours before coming to hospital.   No gum chewing or hard candies.     __x__ 2. No Alcohol for 24 hours before or after surgery.   __x__3. No Smoking or e-cigarettes for 24 prior to surgery.  Do not use any chewable tobacco products for at least 6 hour prior to surgery   ____  4. Bring all medications with you on the day of surgery if instructed.    __x__ 5. Notify your doctor if there is any change in your medical condition     (cold, fever, infections).    x___6. On the morning of surgery brush your teeth with toothpaste and water.  You may rinse your mouth with mouth wash if you wish.  Do not swallow any toothpaste or mouthwash.   Do not wear jewelry, make-up, hairpins,  clips or nail polish.  Do not wear lotions, powders, or perfumes. You may wear deodorant.  Do not shave 48 hours prior to surgery. Men may shave face and neck.  Do not bring valuables to the hospital.    [facility] is not responsible for any belongings or valuables.               Contacts, dentures or bridgework may not be worn into surgery.  Leave your suitcase in the car. After surgery it may be brought to your room.  For patients admitted to the hospital, discharge time is determined by your                       treatment team.  _  Patients discharged the day of surgery will not be allowed to drive home.  You will need someone to drive you home and stay with you the night of your procedure.    Please read over the following fact sheets that you were given:   [facility] Preparing for Surgery and or MRSA Information   _x___ Take anti-hypertensive listed below, cardiac, seizure, asthma,     anti-reflux and psychiatric medicines. These include:  1. pantoprazole (PROTONIX) 40 MG tablet  2.SYNTHROID 125 MCG tablet  3.  4.  5.  6.  ____Fleets enema or Magnesium Citrate as directed.   _x___ Use CHG Soap or sage wipes as directed on instruction sheet   ____ Use inhalers on the day of surgery and bring to hospital day of surgery  _x___ Stop Metformin and Janumet 2 days prior to surgery.    ____ Take 1/2 of usual insulin dose the night before surgery and none on the morning     surgery.   _x___ Follow recommendations from Cardiologist, Pulmonologist or PCP regarding          stopping Aspirin, Coumadin, Plavix ,Eliquis, Effient, or Pradaxa, and Pletal.  X____Stop Anti-inflammatories such as Advil, Aleve, Ibuprofen, Motrin, Naproxen, Naprosyn, Goodies powders or aspirin products. OK to take Tylenol and                          Celebrex.   _x___ Stop supplements until after surgery.  But may continue Vitamin D, Vitamin B,       and multivitamin.   ____ Bring C-Pap to the hospital.

## 2019-03-08 LAB — URINE CULTURE
Culture: <10000 — AB
Special Requests: NORMAL

## 2019-03-08 NOTE — Pre-Procedure Instructions (Signed)
Met B results sent to Dr. Marry Guan and Anesthesia for review.  CRP & HgbA1C results also sent to Dr. Marry Guan for review.

## 2019-03-13 ENCOUNTER — Other Ambulatory Visit: Payer: Self-pay

## 2019-03-13 ENCOUNTER — Other Ambulatory Visit
Admission: RE | Admit: 2019-03-13 | Discharge: 2019-03-13 | Disposition: A | Discharge status: Home / Self Care | Payer: BC Managed Care – PPO | Source: Ambulatory Visit | Attending: Vascular Surgery | Admitting: Vascular Surgery

## 2019-03-13 DIAGNOSIS — Z01812 Encounter for preprocedural laboratory examination: Secondary | ICD-10-CM | POA: Insufficient documentation

## 2019-03-13 DIAGNOSIS — Z20828 Contact with and (suspected) exposure to other viral communicable diseases: Secondary | ICD-10-CM | POA: Insufficient documentation

## 2019-03-14 LAB — SARS CORONAVIRUS 2 (TAT 6-24 HRS): SARS Coronavirus 2: NEGATIVE

## 2019-03-16 ENCOUNTER — Other Ambulatory Visit (INDEPENDENT_AMBULATORY_CARE_PROVIDER_SITE_OTHER): Payer: Self-pay | Admitting: Nurse Practitioner

## 2019-03-17 ENCOUNTER — Encounter: Payer: Self-pay | Admitting: Vascular Surgery

## 2019-03-17 ENCOUNTER — Ambulatory Visit
Admission: RE | Admit: 2019-03-17 | Discharge: 2019-03-17 | Disposition: A | Discharge status: Home / Self Care | Payer: BC Managed Care – PPO | Attending: Vascular Surgery | Admitting: Vascular Surgery

## 2019-03-17 ENCOUNTER — Encounter
Admission: RE | Disposition: A | Discharge status: Home / Self Care | Payer: Self-pay | Source: Home / Self Care | Attending: Vascular Surgery

## 2019-03-17 ENCOUNTER — Other Ambulatory Visit: Payer: Self-pay

## 2019-03-17 DIAGNOSIS — K219 Gastro-esophageal reflux disease without esophagitis: Secondary | ICD-10-CM | POA: Diagnosis not present

## 2019-03-17 DIAGNOSIS — Z86711 Personal history of pulmonary embolism: Secondary | ICD-10-CM

## 2019-03-17 DIAGNOSIS — E669 Obesity, unspecified: Secondary | ICD-10-CM | POA: Diagnosis not present

## 2019-03-17 DIAGNOSIS — E039 Hypothyroidism, unspecified: Secondary | ICD-10-CM | POA: Diagnosis not present

## 2019-03-17 DIAGNOSIS — I10 Essential (primary) hypertension: Secondary | ICD-10-CM | POA: Insufficient documentation

## 2019-03-17 DIAGNOSIS — F329 Major depressive disorder, single episode, unspecified: Secondary | ICD-10-CM | POA: Insufficient documentation

## 2019-03-17 DIAGNOSIS — E119 Type 2 diabetes mellitus without complications: Secondary | ICD-10-CM | POA: Insufficient documentation

## 2019-03-17 DIAGNOSIS — Z01810 Encounter for preprocedural cardiovascular examination: Secondary | ICD-10-CM

## 2019-03-17 DIAGNOSIS — M25562 Pain in left knee: Secondary | ICD-10-CM | POA: Diagnosis not present

## 2019-03-17 DIAGNOSIS — E785 Hyperlipidemia, unspecified: Secondary | ICD-10-CM | POA: Insufficient documentation

## 2019-03-17 DIAGNOSIS — Z6828 Body mass index (BMI) 28.0-28.9, adult: Secondary | ICD-10-CM | POA: Diagnosis not present

## 2019-03-17 DIAGNOSIS — Z86718 Personal history of other venous thrombosis and embolism: Secondary | ICD-10-CM | POA: Insufficient documentation

## 2019-03-17 DIAGNOSIS — M1710 Unilateral primary osteoarthritis, unspecified knee: Secondary | ICD-10-CM | POA: Insufficient documentation

## 2019-03-17 DIAGNOSIS — I82409 Acute embolism and thrombosis of unspecified deep veins of unspecified lower extremity: Secondary | ICD-10-CM

## 2019-03-17 HISTORY — PX: IVC FILTER INSERTION: CATH118245

## 2019-03-17 LAB — GLUCOSE, CAPILLARY: Glucose-Capillary: 219 mg/dL — ABNORMAL HIGH (ref 70–99)

## 2019-03-17 SURGERY — IVC FILTER INSERTION
Anesthesia: Moderate Sedation

## 2019-03-17 MED ORDER — HYDROMORPHONE HCL 1 MG/ML IJ SOLN: 1.0000 mg | Freq: Once | INTRAMUSCULAR | Status: DC | PRN

## 2019-03-17 MED ORDER — METHYLPREDNISOLONE SODIUM SUCC 125 MG IJ SOLR: 125.0000 mg | Freq: Once | INTRAMUSCULAR | Status: DC | PRN

## 2019-03-17 MED ORDER — FENTANYL CITRATE (PF) 100 MCG/2ML IJ SOLN
INTRAMUSCULAR | Status: AC
  Filled 2019-03-17: qty 2

## 2019-03-17 MED ORDER — IODIXANOL 320 MG/ML IV SOLN
INTRAVENOUS | Status: DC | PRN
  Administered 2019-03-17: 15 mL via INTRAVENOUS

## 2019-03-17 MED ORDER — FAMOTIDINE 20 MG PO TABS: 40.0000 mg | ORAL_TABLET | Freq: Once | ORAL | Status: DC | PRN

## 2019-03-17 MED ORDER — SODIUM CHLORIDE 0.9 % IV SOLN
INTRAVENOUS | Status: DC
  Administered 2019-03-17: 1000 mL via INTRAVENOUS

## 2019-03-17 MED ORDER — MIDAZOLAM HCL 2 MG/2ML IJ SOLN
INTRAMUSCULAR | Status: DC | PRN
  Administered 2019-03-17: 2 mg via INTRAVENOUS

## 2019-03-17 MED ORDER — DIPHENHYDRAMINE HCL 50 MG/ML IJ SOLN: 50.0000 mg | Freq: Once | INTRAMUSCULAR | Status: DC | PRN

## 2019-03-17 MED ORDER — MIDAZOLAM HCL 2 MG/ML PO SYRP: 8.0000 mg | ORAL_SOLUTION | Freq: Once | ORAL | Status: DC | PRN

## 2019-03-17 MED ORDER — CLINDAMYCIN PHOSPHATE 300 MG/50ML IV SOLN
300.0000 mg | Freq: Once | INTRAVENOUS | Status: DC
  Filled 2019-03-17: qty 50

## 2019-03-17 MED ORDER — CLINDAMYCIN PHOSPHATE 300 MG/50ML IV SOLN
INTRAVENOUS | Status: AC
  Administered 2019-03-17: 300 mg
  Filled 2019-03-17: qty 50

## 2019-03-17 MED ORDER — MIDAZOLAM HCL 5 MG/5ML IJ SOLN
INTRAMUSCULAR | Status: AC
  Filled 2019-03-17: qty 5

## 2019-03-17 MED ORDER — ONDANSETRON HCL 4 MG/2ML IJ SOLN: 4.0000 mg | Freq: Four times a day (QID) | INTRAMUSCULAR | Status: DC | PRN

## 2019-03-17 MED ORDER — FENTANYL CITRATE (PF) 100 MCG/2ML IJ SOLN
INTRAMUSCULAR | Status: DC | PRN
  Administered 2019-03-17: 50 ug via INTRAVENOUS

## 2019-03-17 SURGICAL SUPPLY — 5 items
COVER PROBE U/S 5X48 (MISCELLANEOUS) ×3 IMPLANT
KIT FEMORAL DEL DENALI (Miscellaneous) ×3 IMPLANT
PACK ANGIOGRAPHY (CUSTOM PROCEDURE TRAY) ×3 IMPLANT
SET VENACAVA FILTER RETRIEVAL (MISCELLANEOUS) IMPLANT
WIRE J 3MM .035X145CM (WIRE) ×3 IMPLANT

## 2019-03-17 NOTE — H&P (Signed)
[facility] Admission History & Physical  MRN : [id number]  Alisha Sanchez is a 63 y.o. ([birth year]) female who presents with chief complaint of No chief complaint on file. Marland Kitchen  History of Present Illness: patient has a previous history of DVT PE and has upcoming knee replacement.  This was originally scheduled for April, but has been delayed five months due to Covid 19 restrictions.  She has no new complaints today.  She continues to have debilitating knee pain.  Her surgery is now scheduled for this week and she is here for her IVC filter placement due to her high risk of thromboembolic complications.  She is in her typical state of health today  Current Facility-Administered Medications  Medication Dose Route Frequency Provider Last Rate Last Dose  . fentaNYL (SUBLIMAZE) 100 MCG/2ML injection           . midazolam (VERSED) 5 MG/5ML injection           . 0.9 %  sodium chloride infusion   Intravenous Continuous Eulogio Ditch E, NP      . clindamycin (CLEOCIN) 300 MG/50ML IVPB           . clindamycin (CLEOCIN) IVPB 300 mg  300 mg Intravenous Once Kris Hartmann, NP      . diphenhydrAMINE (BENADRYL) injection 50 mg  50 mg Intravenous Once PRN Kris Hartmann, NP      . famotidine (PEPCID) tablet 40 mg  40 mg Oral Once PRN Kris Hartmann, NP      . HYDROmorphone (DILAUDID) injection 1 mg  1 mg Intravenous Once PRN Eulogio Ditch E, NP      . methylPREDNISolone sodium succinate (SOLU-MEDROL) 125 mg/2 mL injection 125 mg  125 mg Intravenous Once PRN Eulogio Ditch E, NP      . midazolam (VERSED) 2 MG/ML syrup 8 mg  8 mg Oral Once PRN Kris Hartmann, NP      . ondansetron (ZOFRAN) injection 4 mg  4 mg Intravenous Q6H PRN Kris Hartmann, NP        Past Medical History:  Diagnosis Date  . Arthritis    knees  . Cancer ([place])    Thyroid  . Complication of anesthesia   . Depression   . Diabetes mellitus type 2 in obese ([facility])   . Dyspnea   . GERD  (gastroesophageal reflux disease)   . Headache    migraines - 3-4x/yr  . HLD (hyperlipidemia)   . HTN (hypertension)   . Hypothyroidism    no thyroid  . Localized swelling of both lower legs    leaky veins  . Lower extremity edema   . Neck pain    left side  . Numbness and tingling    numbness in hands and face tingles  . PONV (postoperative nausea and vomiting)   . Pulmonary embolus ([place])    after thyroid surgery  . Vertigo    no episodes in approx 2 yrs    Past Surgical History:  Procedure Laterality Date  . c-sections     x2   . CATARACT EXTRACTION W/PHACO Left 08/27/2017   Procedure: CATARACT EXTRACTION PHACO AND INTRAOCULAR LENS PLACEMENT ([place]) LEFT DIABETIC;  Surgeon: Leandrew Koyanagi, MD;  Location: [facility];  Service: Ophthalmology;  Laterality: Left;  Diabetic - oral meds  . COLONOSCOPY N/A 11/10/2015   Procedure: COLONOSCOPY;  Surgeon: Hulen Luster, MD;  Location: [facility];  Service: Gastroenterology;  Laterality: N/A;  .  ESOPHAGOGASTRODUODENOSCOPY N/A 11/10/2015   Procedure: ESOPHAGOGASTRODUODENOSCOPY (EGD) with dialation;  Surgeon: Hulen Luster, MD;  Location: [facility];  Service: Gastroenterology;  Laterality: N/A;  . SHOULDER ARTHROSCOPY Left 11/15/2018   Procedure: ARTHROSCOPY SHOULDER WITH DEBRIDEMENT DECOMPRESSION, ROTATOR CUFF REPAIR AND POSSIBLE BICEPS TENODESIS;  Surgeon: Corky Mull, MD;  Location: [place];  Service: Orthopedics;  Laterality: Left;  [facility]  . THYROIDECTOMY      Social History Social History   Tobacco Use  . Smoking status: Never Smoker  . Smokeless tobacco: Never Used  . Tobacco comment: tobacco use- no  Substance Use Topics  . Alcohol use: Yes    Alcohol/week: 1.0 standard drinks    Types: 1 Glasses of wine per week    Comment: occassional wine  . Drug use: No    Family History Family History  Problem Relation Age of Onset  . Liver cancer Father   . Lung  cancer Father   . Diabetes Father   . Stroke Other   . Breast cancer Maternal Aunt 32  . Breast cancer Paternal Aunt 39    No Known Allergies   REVIEW OF SYSTEMS (Negative unless checked)  Constitutional: [] Weight loss  [] Fever  [] Chills Cardiac: [] Chest pain   [] Chest pressure   [] Palpitations   [] Shortness of breath when laying flat   [] Shortness of breath at rest   [] Shortness of breath with exertion. Vascular:  [] Pain in legs with walking   [] Pain in legs at rest   [] Pain in legs when laying flat   [] Claudication   [] Pain in feet when walking  [] Pain in feet at rest  [] Pain in feet when laying flat   [x] History of DVT   [x] Phlebitis   [x] Swelling in legs   [] Varicose veins   [] Non-healing ulcers Pulmonary:   [] Uses home oxygen   [] Productive cough   [] Hemoptysis   [] Wheeze  [] COPD   [] Asthma Neurologic:  [] Dizziness  [] Blackouts   [] Seizures   [] History of stroke   [] History of TIA  [] Aphasia   [] Temporary blindness   [] Dysphagia   [] Weakness or numbness in arms   [] Weakness or numbness in legs Musculoskeletal:  [x] Arthritis   [] Joint swelling   [x] Joint pain   [] Low back pain Hematologic:  [] Easy bruising  [] Easy bleeding   [] Hypercoagulable state   [] Anemic  [] Hepatitis Gastrointestinal:  [] Blood in stool   [] Vomiting blood  [] Gastroesophageal reflux/heartburn   [] Difficulty swallowing. Genitourinary:  [] Chronic kidney disease   [] Difficult urination  [] Frequent urination  [] Burning with urination   [] Blood in urine Skin:  [] Rashes   [] Ulcers   [] Wounds Psychological:  [] History of anxiety   [x]  History of major depression.  Physical Examination  Vitals:   03/17/19 0745  BP: 133/84  Pulse: 74  Resp: 17  Temp: 97.8 F (36.6 C)  TempSrc: Oral  SpO2: 98%  Weight: 81.6 kg  Height: 5\' 7"  (1.702 m)   Body mass index is 28.18 kg/m. Gen: WD/WN, NAD Head: [place]/AT, No temporalis wasting.  Ear/Nose/Throat: Hearing grossly intact, nares w/o erythema or drainage, oropharynx w/o  Erythema/Exudate,  Eyes: Conjunctiva clear, sclera non-icteric Neck: Trachea midline.  No JVD.  Pulmonary:  Good air movement, respirations not labored, no use of accessory muscles.  Cardiac: RRR, normal S1, S2. Vascular:  Vessel Right Left  Radial Palpable Palpable    Musculoskeletal: M/S 5/5 throughout.  Extremities without ischemic changes.  No deformity or atrophy.  Mild lower extremity swelling Neurologic: Sensation  grossly intact in extremities.  Symmetrical.  Speech is fluent. Motor exam as listed above. Psychiatric: Judgment intact, Mood & affect appropriate for pt's clinical situation. Dermatologic: No rashes or ulcers noted.  No cellulitis or open wounds.      CBC Lab Results  Component Value Date   WBC 7.0 03/07/2019   HGB 13.0 03/07/2019   HCT 38.3 03/07/2019   MCV 80.3 03/07/2019   PLT 350 03/07/2019    BMET    Component Value Date/Time   NA 138 03/07/2019 1507   NA 139 08/11/2013 1137   K 3.4 (L) 03/07/2019 1507   K 3.2 (L) 09/09/2013 1645   CL 99 03/07/2019 1507   CL 105 08/11/2013 1137   CO2 26 03/07/2019 1507   CO2 26 08/11/2013 1137   GLUCOSE 263 (H) 03/07/2019 1507   GLUCOSE 172 (H) 08/11/2013 1137   BUN 13 03/07/2019 1507   BUN 14 08/11/2013 1137   CREATININE 0.56 03/07/2019 1507   CREATININE 0.77 08/11/2013 1137   CALCIUM 9.5 03/07/2019 1507   CALCIUM 9.0 08/11/2013 1137   GFRNONAA >60 03/07/2019 1507   GFRNONAA >60 08/11/2013 1137   GFRAA >60 03/07/2019 1507   GFRAA >60 08/11/2013 1137   Estimated Creatinine Clearance: 79.1 mL/min (by C-G formula based on SCr of 0.56 mg/dL).  COAG Lab Results  Component Value Date   INR 0.9 03/07/2019   INR 0.9 07/22/2007    Radiology No results found.    Assessment/Plan 1.  History of pulmonary embolus after minor surgery with upcoming major knee replacement.  Will be a very high risk for thromboembolic complications so an IVC filter will be placed.  We will plan to see the patient back 6 to 12  weeks following her surgery for consideration for removal of the filter.  We discussed that there is a small risk of complications with filters left in long-term, and if they are not needed removal is certainly reasonable. 2.  Degenerative arthritis in the knee.  For knee replacement later this week 3.  Diabetes mellitus.  Stable on outpatient medications and blood glucose control important in reducing the progression of atherosclerotic disease. Also, involved in wound healing. On appropriate medications. 4. Hypertension. Stable on outpatient medication and blood pressure control important in reducing the progression of atherosclerotic disease. On appropriate oral medications.    Leotis Pain, MD  03/17/2019 8:12 AM

## 2019-03-17 NOTE — Op Note (Signed)
[facility]   OPERATIVE NOTE    PRE-OPERATIVE DIAGNOSIS: previous PE, upcoming knee replacement  POST-OPERATIVE DIAGNOSIS: same as above  PROCEDURE: 1.   Ultrasound guidance for vascular access to the right femoral vein 2.   Catheter placement into the inferior vena cava 3.   Inferior venacavogram 4.   Placement of a Bard Denali IVC filter  SURGEON: Leotis Pain, MD  ASSISTANT(S): None  ANESTHESIA: local with Moderate Conscious Sedation for approximately 15 minutes using 2 mg of Versed and 50 mcg of Fentanyl  ESTIMATED BLOOD LOSS: minimal  CONTRAST: 15 cc  FLUORO TIME: less than one minute  FINDING(S): 1.  Patent IVC  SPECIMEN(S):  none  INDICATIONS:   Alisha Sanchez is a 63 y.o. female who presents with an upcoming knee replacement and a previous history of pulmonary embolus making her very high risk for thromboembolic complications around the time of her knee replacement.  Inferior vena cava filter is indicated for this reason.  Risks and benefits including filter thrombosis, migration, fracture, bleeding, and infection were all discussed.  We discussed that all IVC filters that we place can be removed if desired from the patient once the need for the filter has passed.    DESCRIPTION: After obtaining full informed written consent, the patient was brought back to the vascular suite. The skin was sterilely prepped and draped in a sterile surgical field was created. Moderate conscious sedation was administered during a face to face encounter with the patient throughout the procedure with my supervision of the RN administering medicines and monitoring the patient's vital signs, pulse oximetry, telemetry and mental status throughout from the start of the procedure until the patient was taken to the recovery room. The right femoral vein was accessed under direct ultrasound guidance without difficulty with a Seldinger needle and a J-wire was then placed.  After skin nick and dilatation, the delivery sheath was placed into the inferior vena cava and an inferior venacavogram was performed. This demonstrated a patent IVC with the level of the renal veins at L1.  The filter was then deployed into the inferior vena cava at the level of L2 just below the renal veins. The delivery sheath was then removed. Pressure was held. Sterile dressings were placed. The patient tolerated the procedure well and was taken to the recovery room in stable condition.  COMPLICATIONS: None  CONDITION: Stable  Leotis Pain  03/17/2019, 9:05 AM   This note was created with Dragon Medical transcription system. Any errors in dictation are purely unintentional.

## 2019-03-18 ENCOUNTER — Telehealth (INDEPENDENT_AMBULATORY_CARE_PROVIDER_SITE_OTHER): Payer: Self-pay

## 2019-03-18 NOTE — Telephone Encounter (Signed)
Patient called wanting to know when could she take a shower and what to do about her incision site. Per Eulogio Ditch NP the patient can take a shower in 2 days after today and to keep a Band-Aid on the site for at least a week. This recommendation was given to the patient.

## 2019-03-20 ENCOUNTER — Other Ambulatory Visit: Payer: Self-pay

## 2019-03-20 ENCOUNTER — Other Ambulatory Visit
Admission: RE | Admit: 2019-03-20 | Discharge: 2019-03-20 | Disposition: A | Discharge status: Home / Self Care | Payer: BC Managed Care – PPO | Source: Ambulatory Visit | Attending: Orthopedic Surgery | Admitting: Orthopedic Surgery

## 2019-03-20 DIAGNOSIS — Z20828 Contact with and (suspected) exposure to other viral communicable diseases: Secondary | ICD-10-CM | POA: Insufficient documentation

## 2019-03-20 DIAGNOSIS — Z01812 Encounter for preprocedural laboratory examination: Secondary | ICD-10-CM | POA: Insufficient documentation

## 2019-03-20 LAB — SARS CORONAVIRUS 2 (TAT 6-24 HRS): SARS Coronavirus 2: NEGATIVE

## 2019-03-23 MED ORDER — TRANEXAMIC ACID-NACL 1000-0.7 MG/100ML-% IV SOLN
1000.0000 mg | INTRAVENOUS | Status: AC
  Administered 2019-03-24: 1000 mg via INTRAVENOUS
  Filled 2019-03-23: qty 100

## 2019-03-24 ENCOUNTER — Inpatient Hospital Stay: Payer: BC Managed Care – PPO | Admitting: Certified Registered Nurse Anesthetist

## 2019-03-24 ENCOUNTER — Inpatient Hospital Stay
Admission: RE | Admit: 2019-03-24 | Discharge: 2019-03-26 | DRG: 470 | Disposition: A | Discharge status: Home Health | Payer: BC Managed Care – PPO | Attending: Orthopedic Surgery | Admitting: Orthopedic Surgery

## 2019-03-24 ENCOUNTER — Encounter
Admission: RE | Disposition: A | Discharge status: Home Health | Payer: Self-pay | Source: Home / Self Care | Attending: Orthopedic Surgery

## 2019-03-24 ENCOUNTER — Other Ambulatory Visit: Payer: Self-pay

## 2019-03-24 ENCOUNTER — Inpatient Hospital Stay: Payer: BC Managed Care – PPO

## 2019-03-24 ENCOUNTER — Encounter: Payer: Self-pay | Admitting: Orthopedic Surgery

## 2019-03-24 DIAGNOSIS — Z6827 Body mass index (BMI) 27.0-27.9, adult: Secondary | ICD-10-CM | POA: Diagnosis not present

## 2019-03-24 DIAGNOSIS — I10 Essential (primary) hypertension: Secondary | ICD-10-CM | POA: Diagnosis present

## 2019-03-24 DIAGNOSIS — Z7983 Long term (current) use of bisphosphonates: Secondary | ICD-10-CM

## 2019-03-24 DIAGNOSIS — Z86711 Personal history of pulmonary embolism: Secondary | ICD-10-CM

## 2019-03-24 DIAGNOSIS — Z833 Family history of diabetes mellitus: Secondary | ICD-10-CM

## 2019-03-24 DIAGNOSIS — E669 Obesity, unspecified: Secondary | ICD-10-CM | POA: Diagnosis present

## 2019-03-24 DIAGNOSIS — E890 Postprocedural hypothyroidism: Secondary | ICD-10-CM | POA: Diagnosis present

## 2019-03-24 DIAGNOSIS — Z7951 Long term (current) use of inhaled steroids: Secondary | ICD-10-CM

## 2019-03-24 DIAGNOSIS — Z823 Family history of stroke: Secondary | ICD-10-CM

## 2019-03-24 DIAGNOSIS — Z7982 Long term (current) use of aspirin: Secondary | ICD-10-CM

## 2019-03-24 DIAGNOSIS — M1712 Unilateral primary osteoarthritis, left knee: Principal | ICD-10-CM | POA: Diagnosis present

## 2019-03-24 DIAGNOSIS — E119 Type 2 diabetes mellitus without complications: Secondary | ICD-10-CM | POA: Diagnosis present

## 2019-03-24 DIAGNOSIS — Z85850 Personal history of malignant neoplasm of thyroid: Secondary | ICD-10-CM | POA: Diagnosis not present

## 2019-03-24 DIAGNOSIS — Z8249 Family history of ischemic heart disease and other diseases of the circulatory system: Secondary | ICD-10-CM

## 2019-03-24 DIAGNOSIS — E785 Hyperlipidemia, unspecified: Secondary | ICD-10-CM | POA: Diagnosis present

## 2019-03-24 DIAGNOSIS — I2699 Other pulmonary embolism without acute cor pulmonale: Secondary | ICD-10-CM | POA: Insufficient documentation

## 2019-03-24 DIAGNOSIS — C73 Malignant neoplasm of thyroid gland: Secondary | ICD-10-CM | POA: Insufficient documentation

## 2019-03-24 DIAGNOSIS — Z96659 Presence of unspecified artificial knee joint: Secondary | ICD-10-CM

## 2019-03-24 DIAGNOSIS — Z79899 Other long term (current) drug therapy: Secondary | ICD-10-CM | POA: Diagnosis not present

## 2019-03-24 DIAGNOSIS — F329 Major depressive disorder, single episode, unspecified: Secondary | ICD-10-CM | POA: Diagnosis present

## 2019-03-24 DIAGNOSIS — Z79890 Hormone replacement therapy (postmenopausal): Secondary | ICD-10-CM | POA: Diagnosis not present

## 2019-03-24 DIAGNOSIS — N920 Excessive and frequent menstruation with regular cycle: Secondary | ICD-10-CM | POA: Insufficient documentation

## 2019-03-24 DIAGNOSIS — K219 Gastro-esophageal reflux disease without esophagitis: Secondary | ICD-10-CM | POA: Diagnosis present

## 2019-03-24 DIAGNOSIS — Z7984 Long term (current) use of oral hypoglycemic drugs: Secondary | ICD-10-CM | POA: Diagnosis not present

## 2019-03-24 DIAGNOSIS — Z23 Encounter for immunization: Secondary | ICD-10-CM | POA: Diagnosis not present

## 2019-03-24 DIAGNOSIS — G43909 Migraine, unspecified, not intractable, without status migrainosus: Secondary | ICD-10-CM | POA: Insufficient documentation

## 2019-03-24 HISTORY — PX: KNEE ARTHROPLASTY: SHX992

## 2019-03-24 LAB — GLUCOSE, CAPILLARY
Glucose-Capillary: 228 mg/dL — ABNORMAL HIGH (ref 70–99)
Glucose-Capillary: 251 mg/dL — ABNORMAL HIGH (ref 70–99)
Glucose-Capillary: 250 mg/dL — ABNORMAL HIGH (ref 70–99)
Glucose-Capillary: 255 mg/dL — ABNORMAL HIGH (ref 70–99)
Glucose-Capillary: 258 mg/dL — ABNORMAL HIGH (ref 70–99)

## 2019-03-24 LAB — TYPE AND SCREEN
ABO/RH(D): A POS
Antibody Screen: NEGATIVE

## 2019-03-24 SURGERY — ARTHROPLASTY, KNEE, TOTAL, USING IMAGELESS COMPUTER-ASSISTED NAVIGATION
Anesthesia: Spinal | Site: Knee | Laterality: Left

## 2019-03-24 MED ORDER — DIPHENHYDRAMINE HCL 12.5 MG/5ML PO ELIX: 12.5000 mg | ORAL_SOLUTION | ORAL | Status: DC | PRN

## 2019-03-24 MED ORDER — DEXAMETHASONE SODIUM PHOSPHATE 10 MG/ML IJ SOLN
INTRAMUSCULAR | Status: AC
  Administered 2019-03-24: 8 mg via INTRAVENOUS
  Filled 2019-03-24: qty 1

## 2019-03-24 MED ORDER — FENTANYL CITRATE (PF) 100 MCG/2ML IJ SOLN
INTRAMUSCULAR | Status: AC
  Filled 2019-03-24: qty 2

## 2019-03-24 MED ORDER — ONDANSETRON HCL 4 MG PO TABS
4.0000 mg | ORAL_TABLET | Freq: Four times a day (QID) | ORAL | Status: DC | PRN
  Filled 2019-03-24: qty 1

## 2019-03-24 MED ORDER — SODIUM CHLORIDE 0.9 % IV SOLN
INTRAVENOUS | Status: DC
  Administered 2019-03-24 – 2019-03-25 (×2): via INTRAVENOUS

## 2019-03-24 MED ORDER — HYDROMORPHONE HCL 1 MG/ML IJ SOLN: 0.5000 mg | INTRAMUSCULAR | Status: DC | PRN

## 2019-03-24 MED ORDER — FLEET ENEMA 7-19 GM/118ML RE ENEM: 1.0000 | ENEMA | Freq: Once | RECTAL | Status: DC | PRN

## 2019-03-24 MED ORDER — INSULIN ASPART 100 UNIT/ML ~~LOC~~ SOLN
0.0000 [IU] | Freq: Three times a day (TID) | SUBCUTANEOUS | Status: DC
  Administered 2019-03-24: 8 [IU] via SUBCUTANEOUS
  Administered 2019-03-25: 3 [IU] via SUBCUTANEOUS
  Administered 2019-03-25: 12:00:00 8 [IU] via SUBCUTANEOUS
  Administered 2019-03-25: 17:00:00 3 [IU] via SUBCUTANEOUS
  Administered 2019-03-26: 8 [IU] via SUBCUTANEOUS
  Administered 2019-03-26: 09:00:00 5 [IU] via SUBCUTANEOUS
  Filled 2019-03-24 (×6): qty 1

## 2019-03-24 MED ORDER — BUPIVACAINE HCL (PF) 0.5 % IJ SOLN
INTRAMUSCULAR | Status: DC | PRN
  Administered 2019-03-24: 2.5 mL

## 2019-03-24 MED ORDER — CEFAZOLIN SODIUM-DEXTROSE 2-4 GM/100ML-% IV SOLN
2.0000 g | INTRAVENOUS | Status: AC
  Administered 2019-03-24: 08:00:00 2 g via INTRAVENOUS

## 2019-03-24 MED ORDER — CHLORHEXIDINE GLUCONATE CLOTH 2 % EX PADS
6.0000 | MEDICATED_PAD | Freq: Every day | CUTANEOUS | Status: DC
  Administered 2019-03-24: 6 via TOPICAL

## 2019-03-24 MED ORDER — PNEUMOCOCCAL VAC POLYVALENT 25 MCG/0.5ML IJ INJ
0.5000 mL | INJECTION | INTRAMUSCULAR | Status: AC
  Administered 2019-03-26: 09:00:00 0.5 mL via INTRAMUSCULAR
  Filled 2019-03-24: qty 0.5

## 2019-03-24 MED ORDER — PHENOL 1.4 % MT LIQD
1.0000 | OROMUCOSAL | Status: DC | PRN
  Filled 2019-03-24: qty 177

## 2019-03-24 MED ORDER — MIDAZOLAM HCL 5 MG/5ML IJ SOLN
INTRAMUSCULAR | Status: DC | PRN
  Administered 2019-03-24 (×2): 1 mg via INTRAVENOUS

## 2019-03-24 MED ORDER — OXYCODONE HCL 5 MG PO TABS: 10.0000 mg | ORAL_TABLET | ORAL | Status: DC | PRN

## 2019-03-24 MED ORDER — CELECOXIB 200 MG PO CAPS
400.0000 mg | ORAL_CAPSULE | Freq: Once | ORAL | Status: AC
  Administered 2019-03-24: 06:00:00 400 mg via ORAL

## 2019-03-24 MED ORDER — CELECOXIB 200 MG PO CAPS
ORAL_CAPSULE | ORAL | Status: AC
  Administered 2019-03-24: 06:00:00 400 mg via ORAL
  Filled 2019-03-24: qty 2

## 2019-03-24 MED ORDER — FENTANYL CITRATE (PF) 100 MCG/2ML IJ SOLN
INTRAMUSCULAR | Status: AC
  Administered 2019-03-24: 50 ug via INTRAVENOUS
  Filled 2019-03-24: qty 2

## 2019-03-24 MED ORDER — SENNOSIDES-DOCUSATE SODIUM 8.6-50 MG PO TABS
1.0000 | ORAL_TABLET | Freq: Two times a day (BID) | ORAL | Status: DC
  Administered 2019-03-24 – 2019-03-26 (×4): 1 via ORAL
  Filled 2019-03-24 (×4): qty 1

## 2019-03-24 MED ORDER — ENOXAPARIN SODIUM 30 MG/0.3ML ~~LOC~~ SOLN
30.0000 mg | Freq: Two times a day (BID) | SUBCUTANEOUS | Status: DC
  Administered 2019-03-25 – 2019-03-26 (×3): 30 mg via SUBCUTANEOUS
  Filled 2019-03-24 (×3): qty 0.3

## 2019-03-24 MED ORDER — SODIUM CHLORIDE FLUSH 0.9 % IV SOLN
INTRAVENOUS | Status: AC
  Filled 2019-03-24: qty 40

## 2019-03-24 MED ORDER — SODIUM CHLORIDE 0.9 % IV SOLN
INTRAVENOUS | Status: DC | PRN
  Administered 2019-03-24: 10:00:00 60 mL

## 2019-03-24 MED ORDER — NEOMYCIN-POLYMYXIN B GU 40-200000 IR SOLN
Status: AC
  Filled 2019-03-24: qty 20

## 2019-03-24 MED ORDER — ACETAMINOPHEN 10 MG/ML IV SOLN
INTRAVENOUS | Status: AC
  Filled 2019-03-24: qty 100

## 2019-03-24 MED ORDER — LEVOTHYROXINE SODIUM 25 MCG PO TABS
125.0000 ug | ORAL_TABLET | Freq: Every day | ORAL | Status: DC
  Administered 2019-03-25 – 2019-03-26 (×2): 125 ug via ORAL
  Filled 2019-03-24 (×2): qty 1

## 2019-03-24 MED ORDER — PROPOFOL 500 MG/50ML IV EMUL
INTRAVENOUS | Status: DC | PRN
  Administered 2019-03-24: 40 ug/kg/min via INTRAVENOUS

## 2019-03-24 MED ORDER — SODIUM CHLORIDE 0.9 % IV SOLN
INTRAVENOUS | Status: DC
  Administered 2019-03-24: 07:00:00 via INTRAVENOUS

## 2019-03-24 MED ORDER — ALUM & MAG HYDROXIDE-SIMETH 200-200-20 MG/5ML PO SUSP: 30.0000 mL | ORAL | Status: DC | PRN

## 2019-03-24 MED ORDER — AMLODIPINE BESYLATE 5 MG PO TABS
5.0000 mg | ORAL_TABLET | Freq: Every day | ORAL | Status: DC
  Administered 2019-03-25: 5 mg via ORAL
  Filled 2019-03-24 (×2): qty 1

## 2019-03-24 MED ORDER — PHENYLEPHRINE HCL (PRESSORS) 10 MG/ML IV SOLN
INTRAVENOUS | Status: AC
  Filled 2019-03-24: qty 1

## 2019-03-24 MED ORDER — TETRACAINE HCL 1 % IJ SOLN
INTRAMUSCULAR | Status: DC | PRN
  Administered 2019-03-24: 5 mg via INTRASPINAL

## 2019-03-24 MED ORDER — TRAMADOL HCL 50 MG PO TABS
50.0000 mg | ORAL_TABLET | ORAL | Status: DC | PRN
  Administered 2019-03-24 – 2019-03-26 (×3): 100 mg via ORAL
  Filled 2019-03-24 (×4): qty 2

## 2019-03-24 MED ORDER — CHLORHEXIDINE GLUCONATE 4 % EX LIQD: 60.0000 mL | Freq: Once | CUTANEOUS | Status: DC

## 2019-03-24 MED ORDER — METOCLOPRAMIDE HCL 10 MG PO TABS
10.0000 mg | ORAL_TABLET | Freq: Three times a day (TID) | ORAL | Status: AC
  Administered 2019-03-24 – 2019-03-26 (×8): 10 mg via ORAL
  Filled 2019-03-24 (×8): qty 1

## 2019-03-24 MED ORDER — INSULIN ASPART 100 UNIT/ML ~~LOC~~ SOLN
SUBCUTANEOUS | Status: AC
  Administered 2019-03-24: 5 [IU] via SUBCUTANEOUS
  Filled 2019-03-24: qty 1

## 2019-03-24 MED ORDER — MIDAZOLAM HCL 2 MG/2ML IJ SOLN
INTRAMUSCULAR | Status: AC
  Filled 2019-03-24: qty 2

## 2019-03-24 MED ORDER — BUPIVACAINE HCL (PF) 0.25 % IJ SOLN
INTRAMUSCULAR | Status: AC
  Filled 2019-03-24: qty 30

## 2019-03-24 MED ORDER — FENTANYL CITRATE (PF) 100 MCG/2ML IJ SOLN
25.0000 ug | INTRAMUSCULAR | Status: DC | PRN
  Administered 2019-03-24 (×2): 50 ug via INTRAVENOUS

## 2019-03-24 MED ORDER — DEXAMETHASONE SODIUM PHOSPHATE 10 MG/ML IJ SOLN
8.0000 mg | Freq: Once | INTRAMUSCULAR | Status: AC
  Administered 2019-03-24: 07:00:00 8 mg via INTRAVENOUS

## 2019-03-24 MED ORDER — OXYCODONE HCL 5 MG PO TABS: 5.0000 mg | ORAL_TABLET | Freq: Once | ORAL | Status: DC | PRN

## 2019-03-24 MED ORDER — ROSUVASTATIN CALCIUM 10 MG PO TABS
10.0000 mg | ORAL_TABLET | Freq: Every day | ORAL | Status: DC
  Administered 2019-03-24 – 2019-03-25 (×2): 10 mg via ORAL
  Filled 2019-03-24: qty 1
  Filled 2019-03-24: qty 2
  Filled 2019-03-24 (×2): qty 1

## 2019-03-24 MED ORDER — GABAPENTIN 300 MG PO CAPS
ORAL_CAPSULE | ORAL | Status: AC
  Administered 2019-03-24: 300 mg via ORAL
  Filled 2019-03-24: qty 1

## 2019-03-24 MED ORDER — ONDANSETRON HCL 4 MG/2ML IJ SOLN: 4.0000 mg | Freq: Four times a day (QID) | INTRAMUSCULAR | Status: DC | PRN

## 2019-03-24 MED ORDER — FERROUS SULFATE 325 (65 FE) MG PO TABS
325.0000 mg | ORAL_TABLET | Freq: Two times a day (BID) | ORAL | Status: DC
  Administered 2019-03-25 – 2019-03-26 (×3): 325 mg via ORAL
  Filled 2019-03-24 (×4): qty 1

## 2019-03-24 MED ORDER — GABAPENTIN 300 MG PO CAPS
300.0000 mg | ORAL_CAPSULE | Freq: Once | ORAL | Status: AC
  Administered 2019-03-24: 06:00:00 300 mg via ORAL

## 2019-03-24 MED ORDER — OXYCODONE HCL 5 MG/5ML PO SOLN: 5.0000 mg | Freq: Once | ORAL | Status: DC | PRN

## 2019-03-24 MED ORDER — BISACODYL 10 MG RE SUPP
10.0000 mg | Freq: Every day | RECTAL | Status: DC | PRN
  Administered 2019-03-26: 10 mg via RECTAL
  Filled 2019-03-24: qty 1

## 2019-03-24 MED ORDER — PROPOFOL 500 MG/50ML IV EMUL
INTRAVENOUS | Status: AC
  Filled 2019-03-24: qty 50

## 2019-03-24 MED ORDER — METOCLOPRAMIDE HCL 10 MG PO TABS: 5.0000 mg | ORAL_TABLET | Freq: Three times a day (TID) | ORAL | Status: DC | PRN

## 2019-03-24 MED ORDER — BUPIVACAINE HCL (PF) 0.25 % IJ SOLN
INTRAMUSCULAR | Status: DC | PRN
  Administered 2019-03-24: 60 mL

## 2019-03-24 MED ORDER — FENTANYL CITRATE (PF) 100 MCG/2ML IJ SOLN
INTRAMUSCULAR | Status: DC | PRN
  Administered 2019-03-24 (×3): 10 ug via INTRAVENOUS
  Administered 2019-03-24: 20 ug via INTRAVENOUS
  Administered 2019-03-24: 50 ug via INTRAVENOUS

## 2019-03-24 MED ORDER — BUPIVACAINE LIPOSOME 1.3 % IJ SUSP
INTRAMUSCULAR | Status: AC
  Filled 2019-03-24: qty 20

## 2019-03-24 MED ORDER — METOCLOPRAMIDE HCL 5 MG/ML IJ SOLN: 5.0000 mg | Freq: Three times a day (TID) | INTRAMUSCULAR | Status: DC | PRN

## 2019-03-24 MED ORDER — PROPOFOL 10 MG/ML IV BOLUS
INTRAVENOUS | Status: DC | PRN
  Administered 2019-03-24: 16 mg via INTRAVENOUS

## 2019-03-24 MED ORDER — HYDROCHLOROTHIAZIDE 25 MG PO TABS
25.0000 mg | ORAL_TABLET | Freq: Every day | ORAL | Status: DC
  Administered 2019-03-25 – 2019-03-26 (×2): 25 mg via ORAL
  Filled 2019-03-24 (×2): qty 1

## 2019-03-24 MED ORDER — TRANEXAMIC ACID-NACL 1000-0.7 MG/100ML-% IV SOLN
1000.0000 mg | Freq: Once | INTRAVENOUS | Status: AC
  Administered 2019-03-24: 08:00:00 1000 mg via INTRAVENOUS
  Filled 2019-03-24: qty 100

## 2019-03-24 MED ORDER — CEFAZOLIN SODIUM-DEXTROSE 2-4 GM/100ML-% IV SOLN
INTRAVENOUS | Status: AC
  Filled 2019-03-24: qty 100

## 2019-03-24 MED ORDER — INSULIN ASPART 100 UNIT/ML ~~LOC~~ SOLN
5.0000 [IU] | Freq: Once | SUBCUTANEOUS | Status: AC
  Administered 2019-03-24: 12:00:00 5 [IU] via SUBCUTANEOUS

## 2019-03-24 MED ORDER — METFORMIN HCL 500 MG PO TABS
500.0000 mg | ORAL_TABLET | Freq: Two times a day (BID) | ORAL | Status: DC
  Administered 2019-03-24 – 2019-03-26 (×4): 500 mg via ORAL
  Filled 2019-03-24 (×4): qty 1

## 2019-03-24 MED ORDER — GABAPENTIN 300 MG PO CAPS
300.0000 mg | ORAL_CAPSULE | Freq: Every day | ORAL | Status: DC
  Administered 2019-03-24 – 2019-03-25 (×2): 300 mg via ORAL
  Filled 2019-03-24 (×2): qty 1

## 2019-03-24 MED ORDER — BUPIVACAINE HCL (PF) 0.5 % IJ SOLN
INTRAMUSCULAR | Status: AC
  Filled 2019-03-24: qty 10

## 2019-03-24 MED ORDER — CEFAZOLIN SODIUM-DEXTROSE 2-4 GM/100ML-% IV SOLN
2.0000 g | Freq: Four times a day (QID) | INTRAVENOUS | Status: AC
  Administered 2019-03-24 – 2019-03-25 (×3): 2 g via INTRAVENOUS
  Filled 2019-03-24 (×4): qty 100

## 2019-03-24 MED ORDER — CELECOXIB 200 MG PO CAPS
200.0000 mg | ORAL_CAPSULE | Freq: Two times a day (BID) | ORAL | Status: DC
  Administered 2019-03-24 – 2019-03-26 (×4): 200 mg via ORAL
  Filled 2019-03-24 (×4): qty 1

## 2019-03-24 MED ORDER — MAGNESIUM HYDROXIDE 400 MG/5ML PO SUSP
30.0000 mL | Freq: Every day | ORAL | Status: DC
  Administered 2019-03-25: 30 mL via ORAL
  Filled 2019-03-24 (×2): qty 30

## 2019-03-24 MED ORDER — SODIUM CHLORIDE 0.9 % IV SOLN
INTRAVENOUS | Status: DC | PRN
  Administered 2019-03-24: 07:00:00 30 ug/min via INTRAVENOUS

## 2019-03-24 MED ORDER — LIDOCAINE HCL (PF) 2 % IJ SOLN
INTRAMUSCULAR | Status: AC
  Filled 2019-03-24: qty 10

## 2019-03-24 MED ORDER — CELECOXIB 200 MG PO CAPS
ORAL_CAPSULE | ORAL | Status: AC
  Filled 2019-03-24: qty 2

## 2019-03-24 MED ORDER — SITAGLIPTIN PHOS-METFORMIN HCL 50-500 MG PO TABS: 1.0000 | ORAL_TABLET | Freq: Two times a day (BID) | ORAL | Status: DC

## 2019-03-24 MED ORDER — OXYCODONE HCL 5 MG PO TABS
5.0000 mg | ORAL_TABLET | ORAL | Status: DC | PRN
  Administered 2019-03-24 – 2019-03-26 (×2): 5 mg via ORAL
  Filled 2019-03-24 (×2): qty 1

## 2019-03-24 MED ORDER — ACETAMINOPHEN 10 MG/ML IV SOLN
1000.0000 mg | Freq: Four times a day (QID) | INTRAVENOUS | Status: AC
  Administered 2019-03-24 – 2019-03-25 (×4): 1000 mg via INTRAVENOUS
  Filled 2019-03-24 (×5): qty 100

## 2019-03-24 MED ORDER — NEOMYCIN-POLYMYXIN B GU 40-200000 IR SOLN
Status: DC | PRN
  Administered 2019-03-24: 14 mL

## 2019-03-24 MED ORDER — GLYCOPYRROLATE 0.2 MG/ML IJ SOLN
INTRAMUSCULAR | Status: AC
  Filled 2019-03-24: qty 1

## 2019-03-24 MED ORDER — ACETAMINOPHEN 325 MG PO TABS: 325.0000 mg | ORAL_TABLET | Freq: Four times a day (QID) | ORAL | Status: DC | PRN

## 2019-03-24 MED ORDER — MENTHOL 3 MG MT LOZG
1.0000 | LOZENGE | OROMUCOSAL | Status: DC | PRN
  Filled 2019-03-24: qty 9

## 2019-03-24 MED ORDER — ACETAMINOPHEN 10 MG/ML IV SOLN
INTRAVENOUS | Status: DC | PRN
  Administered 2019-03-24: 1000 mg via INTRAVENOUS

## 2019-03-24 MED ORDER — ADULT MULTIVITAMIN W/MINERALS CH
1.0000 | ORAL_TABLET | Freq: Every day | ORAL | Status: DC
  Administered 2019-03-25 – 2019-03-26 (×2): 1 via ORAL
  Filled 2019-03-24 (×2): qty 1

## 2019-03-24 MED ORDER — INFLUENZA VAC SPLIT QUAD 0.5 ML IM SUSY
0.5000 mL | PREFILLED_SYRINGE | INTRAMUSCULAR | Status: AC
  Administered 2019-03-26: 0.5 mL via INTRAMUSCULAR
  Filled 2019-03-24: qty 0.5

## 2019-03-24 MED ORDER — PANTOPRAZOLE SODIUM 40 MG PO TBEC
40.0000 mg | DELAYED_RELEASE_TABLET | Freq: Two times a day (BID) | ORAL | Status: DC
  Administered 2019-03-24 – 2019-03-26 (×4): 40 mg via ORAL
  Filled 2019-03-24 (×4): qty 1

## 2019-03-24 SURGICAL SUPPLY — 71 items
ATTUNE MED DOME PAT 38 KNEE (Knees) ×1 IMPLANT
ATTUNE MED DOME PAT 38MM KNEE (Knees) ×1 IMPLANT
ATTUNE PSFEM LTSZ5 NARCEM KNEE (Femur) ×2 IMPLANT
ATTUNE PSRP INSR SZ5 5 KNEE (Insert) ×1 IMPLANT
ATTUNE PSRP INSR SZ5 5MM KNEE (Insert) ×1 IMPLANT
BASEPLATE TIBIAL ROTATING SZ 4 (Knees) ×2 IMPLANT
BATTERY INSTRU NAVIGATION (MISCELLANEOUS) ×12 IMPLANT
BLADE SAW 70X12.5 (BLADE) ×3 IMPLANT
BLADE SAW 90X13X1.19 OSCILLAT (BLADE) ×3 IMPLANT
BLADE SAW 90X25X1.19 OSCILLAT (BLADE) ×3 IMPLANT
BONE CEMENT GENTAMICIN (Cement) ×6 IMPLANT
CANISTER SUCT 3000ML PPV (MISCELLANEOUS) ×3 IMPLANT
CEMENT BONE GENTAMICIN 40 (Cement) IMPLANT
COOLER POLAR GLACIER W/PUMP (MISCELLANEOUS) ×3 IMPLANT
COVER WAND RF STERILE (DRAPES) ×3 IMPLANT
CUFF TOURN SGL QUICK 24 (TOURNIQUET CUFF) ×2
CUFF TOURN SGL QUICK 30 (TOURNIQUET CUFF)
CUFF TRNQT CYL 24X4X16.5-23 (TOURNIQUET CUFF) IMPLANT
CUFF TRNQT CYL 30X4X21-28X (TOURNIQUET CUFF) IMPLANT
DRAPE 3/4 80X56 (DRAPES) ×3 IMPLANT
DRSG DERMACEA 8X12 NADH (GAUZE/BANDAGES/DRESSINGS) ×3 IMPLANT
DRSG OPSITE POSTOP 4X14 (GAUZE/BANDAGES/DRESSINGS) ×3 IMPLANT
DRSG TEGADERM 4X4.75 (GAUZE/BANDAGES/DRESSINGS) ×3 IMPLANT
DURAPREP 26ML APPLICATOR (WOUND CARE) ×6 IMPLANT
ELECT REM PT RETURN 9FT ADLT (ELECTROSURGICAL) ×3
ELECTRODE REM PT RTRN 9FT ADLT (ELECTROSURGICAL) ×1 IMPLANT
EX-PIN ORTHOLOCK NAV 4X150 (PIN) ×6 IMPLANT
GLOVE BIOGEL M STRL SZ7.5 (GLOVE) ×6 IMPLANT
GLOVE INDICATOR 8.0 STRL GRN (GLOVE) ×3 IMPLANT
GOWN STRL REUS W/ TWL LRG LVL3 (GOWN DISPOSABLE) ×2 IMPLANT
GOWN STRL REUS W/TWL LRG LVL3 (GOWN DISPOSABLE) ×4
HEMOVAC 400CC 10FR (MISCELLANEOUS) ×3 IMPLANT
HOLDER FOLEY CATH W/STRAP (MISCELLANEOUS) ×3 IMPLANT
HOOD PEEL AWAY FLYTE STAYCOOL (MISCELLANEOUS) ×8 IMPLANT
KIT TURNOVER KIT A (KITS) ×3 IMPLANT
KNIFE SCULPS 14X20 (INSTRUMENTS) ×3 IMPLANT
LABEL OR SOLS (LABEL) ×3 IMPLANT
MANIFOLD NEPTUNE II (INSTRUMENTS) ×3 IMPLANT
NDL SAFETY ECLIPSE 18X1.5 (NEEDLE) ×1 IMPLANT
NDL SPNL 20GX3.5 QUINCKE YW (NEEDLE) ×2 IMPLANT
NEEDLE HYPO 18GX1.5 SHARP (NEEDLE) ×2
NEEDLE SPNL 20GX3.5 QUINCKE YW (NEEDLE) ×6 IMPLANT
NS IRRIG 500ML POUR BTL (IV SOLUTION) ×3 IMPLANT
PACK TOTAL KNEE (MISCELLANEOUS) ×3 IMPLANT
PAD WRAPON POLAR KNEE (MISCELLANEOUS) ×1 IMPLANT
PENCIL SMOKE ULTRAEVAC 22 CON (MISCELLANEOUS) ×3 IMPLANT
PIN DRILL QUICK PACK ×3 IMPLANT
PIN FIXATION 1/8DIA X 3INL (PIN) ×9 IMPLANT
PULSAVAC PLUS IRRIG FAN TIP (DISPOSABLE) ×3
SOL .9 NS 3000ML IRR  AL (IV SOLUTION) ×2
SOL .9 NS 3000ML IRR UROMATIC (IV SOLUTION) ×1 IMPLANT
SOL PREP PVP 2OZ (MISCELLANEOUS) ×3
SOLUTION PREP PVP 2OZ (MISCELLANEOUS) ×1 IMPLANT
SPONGE DRAIN TRACH 4X4 STRL 2S (GAUZE/BANDAGES/DRESSINGS) ×3 IMPLANT
STAPLER SKIN PROX 35W (STAPLE) ×3 IMPLANT
STOCKINETTE IMPERV 14X48 (MISCELLANEOUS) IMPLANT
STRAP TIBIA SHORT (MISCELLANEOUS) ×3 IMPLANT
SUCTION FRAZIER HANDLE 10FR (MISCELLANEOUS) ×2
SUCTION TUBE FRAZIER 10FR DISP (MISCELLANEOUS) ×1 IMPLANT
SUT VIC AB 0 CT1 36 (SUTURE) ×5 IMPLANT
SUT VIC AB 1 CT1 36 (SUTURE) ×6 IMPLANT
SUT VIC AB 2-0 CT2 27 (SUTURE) ×3 IMPLANT
SYR 20ML LL LF (SYRINGE) ×3 IMPLANT
SYR 30ML LL (SYRINGE) ×6 IMPLANT
TIP FAN IRRIG PULSAVAC PLUS (DISPOSABLE) ×1 IMPLANT
TOWEL OR 17X26 4PK STRL BLUE (TOWEL DISPOSABLE) ×3 IMPLANT
TOWER CARTRIDGE SMART MIX (DISPOSABLE) ×3 IMPLANT
TRAY FOLEY MTR SLVR 16FR STAT (SET/KITS/TRAYS/PACK) ×3 IMPLANT
TUBING CONNECTING 10 (TUBING) ×1 IMPLANT
TUBING CONNECTING 10' (TUBING) ×1
WRAPON POLAR PAD KNEE (MISCELLANEOUS) ×3

## 2019-03-24 NOTE — TOC Benefit Eligibility Note (Signed)
Transition of Care [facility]) Benefit Eligibility Note    Patient Details  Name: TELIYAH ROYAL MRN: [id number] Date of Birth: Apr 22, 1956   Medication/Dose: Enoxaparin 66m once daily for 14 days  Covered?: Yes  Prescription Coverage [facility]/Company/Phone Number:: Debbie with [employer] at [phone] Co-Pay: $0 estimated copay  Prior Approval: No  Deductible: MMedia Phone Number: [phone] [phone]/21/2020, 2:28 PM

## 2019-03-24 NOTE — Op Note (Signed)
OPERATIVE NOTE  DATE OF SURGERY:  03/24/2019  PATIENT NAME:  Alisha Sanchez   DOB: [birth year]  MRN: [id number]  PRE-OPERATIVE DIAGNOSIS: Degenerative arthrosis of the left knee, primary  POST-OPERATIVE DIAGNOSIS:  Same  PROCEDURE:  Left total knee arthroplasty using computer-assisted navigation  SURGEON:  Marciano Sequin. M.D.  ASSISTANT: Cassell Smiles, PA-C (present and scrubbed throughout the case, critical for assistance with exposure, retraction, instrumentation, and closure)  ANESTHESIA: spinal  ESTIMATED BLOOD LOSS: 50 mL  FLUIDS REPLACED: 1000 mL of crystalloid  TOURNIQUET TIME: 99 minutes  DRAINS: 2 medium Hemovac drains  SOFT TISSUE RELEASES: Anterior cruciate ligament, posterior cruciate ligament, deep medial collateral ligament, patellofemoral ligament  IMPLANTS UTILIZED: DePuy Attune size 5N posterior stabilized femoral component (cemented), size 4 rotating platform tibial component (cemented), 38 mm medialized dome patella (cemented), and a 5 mm stabilized rotating platform polyethylene insert.  INDICATIONS FOR SURGERY: RHYANNON Sanchez is a 63 y.o. year old female with a long history of progressive knee pain. X-rays demonstrated severe degenerative changes in tricompartmental fashion. The patient had not seen any significant improvement despite conservative nonsurgical intervention. After discussion of the risks and benefits of surgical intervention, the patient expressed understanding of the risks benefits and agree with plans for total knee arthroplasty.   The risks, benefits, and alternatives were discussed at length including but not limited to the risks of infection, bleeding, nerve injury, stiffness, blood clots, the need for revision surgery, cardiopulmonary complications, among others, and they were willing to proceed.  PROCEDURE IN DETAIL: The patient was brought into the operating room and, after adequate spinal anesthesia was achieved, a tourniquet  was placed on the patient's upper thigh. The patient's knee and leg were cleaned and prepped with alcohol and DuraPrep and draped in the usual sterile fashion. A "timeout" was performed as per usual protocol. The lower extremity was exsanguinated using an Esmarch, and the tourniquet was inflated to 300 mmHg. An anterior longitudinal incision was made followed by a standard mid vastus approach. The deep fibers of the medial collateral ligament were elevated in a subperiosteal fashion off of the medial flare of the tibia so as to maintain a continuous soft tissue sleeve. The patella was subluxed laterally and the patellofemoral ligament was incised. Inspection of the knee demonstrated severe degenerative changes with full-thickness loss of articular cartilage. Osteophytes were debrided using a rongeur. Anterior and posterior cruciate ligaments were excised. Two 4.0 mm Schanz pins were inserted in the femur and into the tibia for attachment of the array of trackers used for computer-assisted navigation. Hip center was identified using a circumduction technique. Distal landmarks were mapped using the computer. The distal femur and proximal tibia were mapped using the computer. The distal femoral cutting guide was positioned using computer-assisted navigation so as to achieve a 5 distal valgus cut. The femur was sized and it was felt that a size 5N femoral component was appropriate. A size 5 femoral cutting guide was positioned and the anterior cut was performed and verified using the computer. This was followed by completion of the posterior and chamfer cuts. Femoral cutting guide for the central box was then positioned in the center box cut was performed.  Attention was then directed to the proximal tibia. Medial and lateral menisci were excised. The extramedullary tibial cutting guide was positioned using computer-assisted navigation so as to achieve a 0 varus-valgus alignment and 3 posterior slope. The cut was  performed and verified using the computer. The proximal tibia was  sized and it was felt that a size 4 tibial tray was appropriate. Tibial and femoral trials were inserted followed by insertion of a 5 mm polyethylene insert. This allowed for excellent mediolateral soft tissue balancing both in flexion and in full extension. Finally, the patella was cut and prepared so as to accommodate a 38 mm medialized dome patella. A patella trial was placed and the knee was placed through a range of motion with excellent patellar tracking appreciated. The femoral trial was removed after debridement of posterior osteophytes. The central post-hole for the tibial component was reamed followed by insertion of a keel punch. Tibial trials were then removed. Cut surfaces of bone were irrigated with copious amounts of normal saline with antibiotic solution using pulsatile lavage and then suctioned dry. Polymethylmethacrylate cement with gentamicin was prepared in the usual fashion using a vacuum mixer. Cement was applied to the cut surface of the proximal tibia as well as along the undersurface of a size 4 rotating platform tibial component. Tibial component was positioned and impacted into place. Excess cement was removed using [employer] fast streamer. Cement was then applied to the cut surfaces of the femur as well as along the posterior flanges of the size 5N femoral component. The femoral component was positioned and impacted into place. Excess cement was removed using [employer] fast streamer. A 5 mm polyethylene trial was inserted and the knee was brought into full extension with steady axial compression applied. Finally, cement was applied to the backside of a 38 mm medialized dome patella and the patellar component was positioned and patellar clamp applied. Excess cement was removed using [employer] fast streamer. After adequate curing of the cement, the tourniquet was deflated after a total tourniquet time of 99 minutes. Hemostasis was achieved using  electrocautery. The knee was irrigated with copious amounts of normal saline with antibiotic solution using pulsatile lavage and then suctioned dry. 20 mL of 1.3% Exparel and 60 mL of 0.25% Marcaine in 40 mL of normal saline was injected along the posterior capsule, medial and lateral gutters, and along the arthrotomy site. A 5 mm stabilized rotating platform polyethylene insert was inserted and the knee was placed through a range of motion with excellent mediolateral soft tissue balancing appreciated and excellent patellar tracking noted. 2 medium drains were placed in the wound bed and brought out through separate stab incisions. The medial parapatellar portion of the incision was reapproximated using interrupted sutures of #1 Vicryl. Subcutaneous tissue was approximated in layers using first #0 Vicryl followed #2-0 Vicryl. The skin was approximated with skin staples. A sterile dressing was applied.  The patient tolerated the procedure well and was transported to the recovery room in stable condition.    Kathryne Ramella P. Holley Bouche., M.D.

## 2019-03-24 NOTE — Transfer of Care (Signed)
Immediate Anesthesia Transfer of Care Note  Patient: Alisha Sanchez  Procedure(s) Performed: COMPUTER ASSISTED TOTAL KNEE ARTHROPLASTY - RNFA (Left Knee)  Patient Location: PACU  Anesthesia Type:Spinal  Level of Consciousness: awake, alert  and oriented  Airway & Oxygen Therapy: Patient Spontanous Breathing and Patient connected to nasal cannula oxygen  Post-op Assessment: Report given to RN and Post -op Vital signs reviewed and stable  Post vital signs: Reviewed and stable  Last Vitals:  Vitals Value Taken Time  BP    Temp    Pulse 83 03/24/19 1116  Resp    SpO2 98 % 03/24/19 1116  Vitals shown include unvalidated device data.  Last Pain:  Vitals:   03/24/19 0621  TempSrc: Temporal  PainSc: 0-No pain         Complications: No apparent anesthesia complications

## 2019-03-24 NOTE — Evaluation (Signed)
Physical Therapy Evaluation Patient Details Name: Alisha Sanchez MRN: [id number] DOB: 11-07-1955 Today's Date: 03/24/2019   History of Present Illness  Patient is 63 yo female s/p L TKA, WBAT. PMH of HTN, HLD, headaches, DM, depression, PE, vertigo, thyroid surgery, IVC filter placed (03/17/2019).    Clinical Impression  Pt alert, oriented, CNA in room to check vitals, WFLs. Pt reported 4-5/10 pain in L knee, and mostly returned light touch sensation. Pt stated she lives in a two story home (lives on first floor) with husband, previously independent. No falls in the last 6 months.  The patient demonstrated therapeutic exercises with multimodal cues, tactile cues for SLR on LLE, no physical assist needed to complete. Supine to sit with minA for LLE management able to sit EOB for several minutes with fair balance. Sit <> stand with minA and RW, cues for WBAT on LLE, TKE, and use of UE to decrease pain. Pt ambulated ~56ft to recliner, instructed in step through gait pattern. Pt reported mild buckling in L knee due to pain, close CGA provided. Once in the chair pt reported nausea and requesting pain medication, RN notified.  Overall the patient demonstrated deficits (see "PT Problem List") that impede the patient's functional abilities, safety, and mobility and would benefit from skilled PT intervention. Recommendation is HHPT.     Follow Up Recommendations Home health PT    Equipment Recommendations  Rolling walker with 5" wheels;Other (comment)(Pt may be able to borrow a RW)    Recommendations for Other Services       Precautions / Restrictions Precautions Precautions: Knee;Fall Precaution Booklet Issued: Yes (comment) Required Braces or Orthoses: (Pt able to perform SLR with tactile cues) Restrictions Weight Bearing Restrictions: Yes LLE Weight Bearing: Weight bearing as tolerated      Mobility  Bed Mobility Overal bed mobility: Needs Assistance Bed Mobility: Supine to Sit     Supine to sit: Min assist     General bed mobility comments: for LLE management  Transfers Overall transfer level: Needs assistance Equipment used: Rolling walker (2 wheeled) Transfers: Sit to/from Stand Sit to Stand: Min assist            Ambulation/Gait Ambulation/Gait assistance: Min guard Gait Distance (Feet): 3 Feet Assistive device: Rolling walker (2 wheeled)       General Gait Details: step through gait pattern with verbal and tactile cues from PT. pt endorsed some dizziness/nausea after sitting in the chair.  Stairs            Wheelchair Mobility    Modified Rankin (Stroke Patients Only)       Balance Overall balance assessment: Needs assistance Sitting-balance support: Feet supported Sitting balance-Leahy Scale: Fair       Standing balance-Leahy Scale: Poor                               Pertinent Vitals/Pain Pain Assessment: 0-10 Pain Score: 6  Pain Location: L knee Pain Descriptors / Indicators: Sore;Sharp Pain Intervention(s): Limited activity within patient's tolerance;Monitored during session;Repositioned;Ice applied    Home Living Family/patient expects to be discharged to:: Private residence Living Arrangements: Spouse/significant other Available Help at Discharge: Available 24 hours/day Type of Home: House Home Access: Stairs to enter Entrance Stairs-Rails: Psychiatric nurse of Steps: 2 Home Layout: Two level;Able to live on main level with bedroom/bathroom Home Equipment: Kasandra Knudsen - single point;Walker - 4 wheels Additional Comments: May be able to borrow a RW  Prior Function Level of Independence: Independent         Comments: no falls in the last 6 months.     Hand Dominance   Dominant Hand: Right    Extremity/Trunk Assessment   Upper Extremity Assessment Upper Extremity Assessment: Overall WFL for tasks assessed    Lower Extremity Assessment Lower Extremity Assessment: RLE  deficits/detail;LLE deficits/detail RLE Deficits / Details: WFLs LLE Deficits / Details: s/p TKA, able to perform SLR with tactile cues    Cervical / Trunk Assessment Cervical / Trunk Assessment: Normal  Communication   Communication: No difficulties  Cognition Arousal/Alertness: Awake/alert Behavior During Therapy: WFL for tasks assessed/performed Overall Cognitive Status: Within Functional Limits for tasks assessed                                        General Comments      Exercises Total Joint Exercises Ankle Circles/Pumps: AROM;Both;10 reps Quad Sets: AROM;Left;10 reps;Strengthening Gluteal Sets: AROM;Both;Strengthening;10 reps Hip ABduction/ADduction: AROM;Strengthening;Left;10 reps Straight Leg Raises: AROM;Strengthening;Left;10 reps Goniometric ROM: -4 deg extension to 55deg flexion Other Exercises Other Exercises: Pt nauseous after mobilization, stated she was "seeing green dots" Reclined in chair, given water, wet wash cloth. With rest pt reported improvement in symptoms   Assessment/Plan    PT Assessment Patient needs continued PT services  PT Problem List Decreased strength;Decreased mobility;Decreased safety awareness;Decreased range of motion;Decreased knowledge of precautions;Decreased activity tolerance;Decreased knowledge of use of DME;Decreased balance;Pain       PT Treatment Interventions DME instruction;Therapeutic exercise;Gait training;Balance training;Stair training;Neuromuscular re-education;Functional mobility training;Therapeutic activities;Patient/family education    PT Goals (Current goals can be found in the Care Plan section)  Acute Rehab PT Goals Patient Stated Goal: to go home PT Goal Formulation: With patient Time For Goal Achievement: 04/07/19 Potential to Achieve Goals: Good    Frequency BID   Barriers to discharge        Co-evaluation               AM-PAC PT "6 Clicks" Mobility  Outcome Measure Help  needed turning from your back to your side while in a flat bed without using bedrails?: A Little Help needed moving from lying on your back to sitting on the side of a flat bed without using bedrails?: A Little Help needed moving to and from a bed to a chair (including a wheelchair)?: A Little Help needed standing up from a chair using your arms (e.g., wheelchair or bedside chair)?: A Little Help needed to walk in hospital room?: A Little Help needed climbing 3-5 steps with a railing? : A Lot 6 Click Score: 17    End of Session Equipment Utilized During Treatment: Gait belt Activity Tolerance: Patient limited by pain;Other (comment)(nausea)   Nurse Communication: Mobility status;Patient requests pain meds PT Visit Diagnosis: Muscle weakness (generalized) (M62.81);Other abnormalities of gait and mobility (R26.89);Pain Pain - Right/Left: Left Pain - part of body: Knee    Time: [id number] PT Time Calculation (min) (ACUTE ONLY): 42 min   Charges:   PT Evaluation $PT Eval Low Complexity: 1 Low PT Treatments $Therapeutic Exercise: 23-37 mins        Lieutenant Diego PT, DPT 4:20 PM,03/24/19 [phone]

## 2019-03-24 NOTE — Anesthesia Preprocedure Evaluation (Signed)
Anesthesia Evaluation  Patient identified by MRN, date of birth, ID band Patient awake    Reviewed: Allergy & Precautions, H&P , NPO status , Patient's Chart, lab work & pertinent test results  History of Anesthesia Complications (+) PONV and history of anesthetic complications  Airway Mallampati: III  TM Distance: <3 FB Neck ROM: limited    Dental  (+) Chipped, Poor Dentition   Pulmonary neg pulmonary ROS, neg shortness of breath,           Cardiovascular Exercise Tolerance: Good hypertension, (-) angina(-) Past MI and (-) DOE      Neuro/Psych  Headaches, PSYCHIATRIC DISORDERS    GI/Hepatic Neg liver ROS, GERD  Medicated and Controlled,  Endo/Other  diabetes, Type 2Hypothyroidism   Renal/GU      Musculoskeletal  (+) Arthritis ,   Abdominal   Peds  Hematology negative hematology ROS (+)   Anesthesia Other Findings Past Medical History: No date: Arthritis     Comment:  knees No date: Cancer ([place])     Comment:  Thyroid No date: Complication of anesthesia No date: Depression No date: Diabetes mellitus type 2 in obese (HCC) No date: Dyspnea No date: GERD (gastroesophageal reflux disease) No date: Headache     Comment:  migraines - 3-4x/yr No date: HLD (hyperlipidemia) No date: HTN (hypertension) No date: Hypothyroidism     Comment:  no thyroid No date: Localized swelling of both lower legs     Comment:  leaky veins No date: Lower extremity edema No date: Neck pain     Comment:  left side No date: Numbness and tingling     Comment:  numbness in hands and face tingles No date: PONV (postoperative nausea and vomiting) No date: Pulmonary embolus (HCC)     Comment:  after thyroid surgery No date: Vertigo     Comment:  no episodes in approx 2 yrs  Past Surgical History: No date: c-sections     Comment:  x2  08/27/2017: CATARACT EXTRACTION W/PHACO; Left     Comment:  Procedure: CATARACT EXTRACTION PHACO  AND INTRAOCULAR               LENS PLACEMENT ([place]) LEFT DIABETIC;  Surgeon: Leandrew Koyanagi, MD;  Location: [facility];  Service:               Ophthalmology;  Laterality: Left;  Diabetic - oral meds 11/10/2015: COLONOSCOPY; N/A     Comment:  Procedure: COLONOSCOPY;  Surgeon: Hulen Luster, MD;                Location: [facility];  Service:               Gastroenterology;  Laterality: N/A; 11/10/2015: ESOPHAGOGASTRODUODENOSCOPY; N/A     Comment:  Procedure: ESOPHAGOGASTRODUODENOSCOPY (EGD) with               dialation;  Surgeon: Hulen Luster, MD;  Location: [facility];  Service: Gastroenterology;  Laterality:               N/A; 03/17/2019: IVC FILTER INSERTION; N/A     Comment:  Procedure: IVC FILTER INSERTION;  Surgeon: Algernon Huxley,              MD;  Location: [facility];  Service:  Cardiovascular;  Laterality: N/A; 11/15/2018: SHOULDER ARTHROSCOPY; Left     Comment:  Procedure: ARTHROSCOPY SHOULDER WITH DEBRIDEMENT               DECOMPRESSION, ROTATOR CUFF REPAIR AND POSSIBLE BICEPS               TENODESIS;  Surgeon: Corky Mull, MD;  Location: [facility];  Service: Orthopedics;  Laterality: Left;                SMITH AND NEPHEW REGENTEN PATCH No date: THYROIDECTOMY  BMI    Body Mass Index: 27.97 kg/m      Reproductive/Obstetrics negative OB ROS                             Anesthesia Physical Anesthesia Plan  ASA: III  Anesthesia Plan: Spinal   Post-op Pain Management:    Induction:   PONV Risk Score and Plan:   Airway Management Planned: Natural Airway and Nasal Cannula  Additional Equipment:   Intra-op Plan:   Post-operative Plan:   Informed Consent: I have reviewed the patients History and Physical, chart, labs and discussed the procedure including the risks, benefits and alternatives for the proposed anesthesia with the patient or  authorized representative who has indicated his/her understanding and acceptance.     Dental Advisory Given  Plan Discussed with: Anesthesiologist, CRNA and Surgeon  Anesthesia Plan Comments: (Patient reports no bleeding problems and no anticoagulant use.  Plan for spinal with backup GA  Patient consented for risks of anesthesia including but not limited to:  - adverse reactions to medications - risk of bleeding, infection, nerve damage and headache - risk of failed spinal - damage to teeth, lips or other oral mucosa - sore throat or hoarseness - Damage to heart, brain, lungs or loss of life  Patient voiced understanding.)        Anesthesia Quick Evaluation

## 2019-03-24 NOTE — H&P (Signed)
The patient has been re-examined, and the chart reviewed, and there have been no interval changes to the documented history and physical.    The risks, benefits, and alternatives have been discussed at length. The patient expressed understanding of the risks benefits and agreed with plans for surgical intervention.  James P. Hooten, Jr. M.D.    

## 2019-03-24 NOTE — TOC Progression Note (Signed)
Transition of Care [facility]) - Progression Note    Patient Details  Name: Alisha Sanchez MRN: [id number] Date of Birth: 1955-11-25  [facility] of [facility]) CM/SW [place], RN Phone Number: 03/24/2019, 12:04 PM  Clinical Narrative:     Requested the price of Lovenox, will notify the patient once obtained       Expected Discharge Plan and Services                                                 Social Determinants of Health (SDOH) Interventions    Readmission Risk Interventions No flowsheet data found.

## 2019-03-24 NOTE — Anesthesia Procedure Notes (Addendum)
Spinal  Patient location during procedure: OR Start time: 03/24/2019 7:20 AM End time: 03/24/2019 7:23 AM Staffing Anesthesiologist: Piscitello, Precious Haws, MD Resident/CRNA: Bernardo Heater, CRNA Performed: anesthesiologist  Preanesthetic Checklist Completed: patient identified, site marked, surgical consent, pre-op evaluation, timeout performed, IV checked, risks and benefits discussed and monitors and equipment checked Spinal Block Patient position: sitting Prep: ChloraPrep Patient monitoring: heart rate, continuous pulse ox, blood pressure and cardiac monitor Approach: midline Location: L3-4 Injection technique: single-shot Needle Needle type: Introducer and Pencan  Needle gauge: 24 G Needle length: 9 cm Assessment Sensory level: T10 Additional Notes Negative paresthesia. Negative blood return. Positive free-flowing CSF. Expiration date of kit checked and confirmed. Patient tolerated procedure well, without complications.

## 2019-03-24 NOTE — Anesthesia Post-op Follow-up Note (Signed)
Anesthesia QCDR form completed.        

## 2019-03-25 ENCOUNTER — Encounter: Payer: Self-pay | Admitting: Orthopedic Surgery

## 2019-03-25 LAB — GLUCOSE, CAPILLARY
Glucose-Capillary: 177 mg/dL — ABNORMAL HIGH (ref 70–99)
Glucose-Capillary: 256 mg/dL — ABNORMAL HIGH (ref 70–99)
Glucose-Capillary: 179 mg/dL — ABNORMAL HIGH (ref 70–99)
Glucose-Capillary: 167 mg/dL — ABNORMAL HIGH (ref 70–99)

## 2019-03-25 MED ORDER — TRAMADOL HCL 50 MG PO TABS: 50.0000 mg | ORAL_TABLET | Freq: Four times a day (QID) | ORAL | 0 refills | Status: DC | PRN

## 2019-03-25 MED ORDER — ENOXAPARIN SODIUM 40 MG/0.4ML ~~LOC~~ SOLN: 40.0000 mg | SUBCUTANEOUS | 0 refills | Status: DC

## 2019-03-25 MED ORDER — OXYCODONE HCL 5 MG PO TABS: 5.0000 mg | ORAL_TABLET | ORAL | 0 refills | Status: DC | PRN

## 2019-03-25 NOTE — TOC Initial Note (Signed)
Transition of Care [facility]) - Initial/Assessment Note    Patient Details  Name: Alisha Sanchez MRN: [id number] Date of Birth: 25-Jan-1956  Transition of Care [facility]) CM/SW Contact:    Su Hilt, RN Phone Number: 03/25/2019, 12:19 PM  Clinical Narrative:                 Met with the patient in the room and discussed DC plan and needs She has a rolator and cane at home and will borrow a RW from a friend She was set up with [facility] by the office prior to Surgery  She has no additional needs, her husband provides transportation  She was provided the cost of Lovenox at 0$ She can afford her medications  Expected Discharge Plan: [place] Barriers to Discharge: Barriers Resolved   Patient Goals and CMS Choice Patient states their goals for this hospitalization and ongoing recovery are:: go home with her husband      Expected Discharge Plan and Services Expected Discharge Plan: [place]   Discharge Planning Services: CM Consult   Living arrangements for the past 2 months: Single Family Home                 DME Arranged: N/A         HH Arranged: PT HH Agency: [facility] (formerly [employer]) Date [place]: 03/25/19 Time [place]: [id number] Representative spoke with at [place]: Helene Kelp  Prior Living Arrangements/Services Living arrangements for the past 2 months: [place] Lives with:: Spouse Patient language and need for interpreter reviewed:: Yes Do you feel safe going back to the place where you live?: Yes      Need for Family Participation in Patient Care: No (Comment) Care giver support system in place?: Yes (comment) Current home services: DME(cane, rolator and going to borrow a RW) Criminal Activity/Legal Involvement Pertinent to Current Situation/Hospitalization: No - Comment as needed  Activities of Daily Living Home Assistive Devices/Equipment: Eyeglasses ADL Screening  (condition at time of admission) Patient's cognitive ability adequate to safely complete daily activities?: Yes Is the patient deaf or have difficulty hearing?: No Does the patient have difficulty seeing, even when wearing glasses/contacts?: No Does the patient have difficulty concentrating, remembering, or making decisions?: No Patient able to express need for assistance with ADLs?: Yes Does the patient have difficulty dressing or bathing?: No Independently performs ADLs?: Yes (appropriate for developmental age) Does the patient have difficulty walking or climbing stairs?: Yes Weakness of Legs: Left Weakness of Arms/Hands: None  Permission Sought/Granted   Permission granted to share information with : Yes, Verbal Permission Granted              Emotional Assessment Appearance:: Appears stated age Attitude/Demeanor/Rapport: Engaged Affect (typically observed): Appropriate Orientation: : Oriented to Self, Oriented to Place, Oriented to  Time, Oriented to Situation Alcohol / Substance Use: Not Applicable Psych Involvement: No (comment)  Admission diagnosis:  PRIMARY OSTEOARTHRITIS OF LEFT KNEE Patient Active Problem List   Diagnosis Date Noted  . Diabetes mellitus type 2, uncomplicated ([place]) 03/49/1791  . GERD (gastroesophageal reflux disease) 03/24/2019  . Menorrhagia 03/24/2019  . Migraines 03/24/2019  . Pulmonary embolism ([place]) 03/24/2019  . Thyroid cancer ([place]) 03/24/2019  . Total knee replacement status 03/24/2019  . Primary osteoarthritis of left shoulder 11/18/2018  . Rotator cuff tendinitis, left 10/30/2018  . Traumatic complete tear of left rotator cuff 10/30/2018  . History of pulmonary  embolus (PE) 08/04/2018  . Primary osteoarthritis of left knee 05/14/2018  . Chronic pain of left knee 05/14/2018  . Chronic pain syndrome 05/14/2018  . Diabetes ([place]) 09/22/2009  . Hyperlipidemia 09/22/2009  . Essential hypertension 09/22/2009  . FATIGUE / MALAISE 09/22/2009   . EDEMA 09/22/2009  . CHEST PAIN UNSPECIFIED 09/22/2009   PCP:  [name], MD Pharmacy:   [facility] [place], [place] [address] [place] [address] Phone: [phone] Fax: [phone]     Social Determinants of Health (SDOH) Interventions    Readmission Risk Interventions No flowsheet data found.

## 2019-03-25 NOTE — Progress Notes (Signed)
Physical Therapy Treatment Patient Details Name: Alisha Sanchez MRN: [id number] DOB: 22-Mar-1956 Today's Date: 03/25/2019    History of Present Illness Patient is 63 yo female s/p L TKA, WBAT. PMH of HTN, HLD, headaches, DM, depression, PE, vertigo, thyroid surgery, IVC filter placed (03/17/2019).    PT Comments    Pt showed great motivation to work with PT this AM, had only minimal transient lightheadedness when first sitting up but none of the nausea that she had POD0.  Pt was able to do SLRs, had >80 deg of flexion and ambulated ~100 ft with safe and consistent cadence.  Overall pt doing well and is performing as expected AM of POD1.  Pt with no pain on arrival and apart from ROM tasks did not struggle with pain t/o most of the effort.    Follow Up Recommendations  Home health PT     Equipment Recommendations  Rolling walker with 5" wheels    Recommendations for Other Services       Precautions / Restrictions Precautions Precautions: Knee;Fall Restrictions Weight Bearing Restrictions: Yes LLE Weight Bearing: Weight bearing as tolerated    Mobility  Bed Mobility Overal bed mobility: Modified Independent Bed Mobility: Supine to Sit           General bed mobility comments: Pt did well getting LEs to EOB, showed good confidence and minimal need for UEs to assist L  Transfers Overall transfer level: Needs assistance Equipment used: Rolling walker (2 wheeled) Transfers: Sit to/from Stand Sit to Stand: Supervision         General transfer comment: Pt was able to rise to standing w/o phyiscal assist, minimal cuing for set up/hand placement  Ambulation/Gait Ambulation/Gait assistance: Supervision Gait Distance (Feet): 100 Feet Assistive device: Rolling walker (2 wheeled)       General Gait Details: Pt with typical slow/guarded cadence to start that improved over the course of the effort and she was maintaining consistent walker motion and cadence at ~66ft.  Pt  with minimal drop in O2 on (high to low 90s) with the effort w/o significant c/o fatigue or increased pain   Stairs             Wheelchair Mobility    Modified Rankin (Stroke Patients Only)       Balance Overall balance assessment: Needs assistance Sitting-balance support: Feet supported Sitting balance-Leahy Scale: Good Sitting balance - Comments: Pt able to maintain sitting balance w/o issue.   Standing balance support: Bilateral upper extremity supported Standing balance-Leahy Scale: Good Standing balance comment: reliant on walker, but no safety issues with standing acts                            Cognition Arousal/Alertness: Awake/alert Behavior During Therapy: WFL for tasks assessed/performed Overall Cognitive Status: Within Functional Limits for tasks assessed                                        Exercises Total Joint Exercises Ankle Circles/Pumps: AROM;10 reps;Both Quad Sets: Strengthening;10 reps Short Arc Quad: Strengthening;10 reps Heel Slides: Strengthening;10 reps(with resisted leg extensions) Hip ABduction/ADduction: 10 reps;Strengthening Straight Leg Raises: AROM;10 reps Knee Flexion: PROM;5 reps Goniometric ROM: 0-82    General Comments        Pertinent Vitals/Pain Pain Assessment: 0-10 Pain Score: 5 (pt with zero pain at rest pre-session)  Home Living                      Prior Function            PT Goals (current goals can now be found in the care plan section) Progress towards PT goals: Progressing toward goals    Frequency    BID      PT Plan Current plan remains appropriate    Co-evaluation              AM-PAC PT "6 Clicks" Mobility   Outcome Measure  Help needed turning from your back to your side while in a flat bed without using bedrails?: A Little Help needed moving from lying on your back to sitting on the side of a flat bed without using bedrails?: A Little Help  needed moving to and from a bed to a chair (including a wheelchair)?: A Little Help needed standing up from a chair using your arms (e.g., wheelchair or bedside chair)?: A Little Help needed to walk in hospital room?: A Little Help needed climbing 3-5 steps with a railing? : A Lot 6 Click Score: 17    End of Session Equipment Utilized During Treatment: Gait belt Activity Tolerance: Patient tolerated treatment well Patient left: with chair alarm set;with call bell/phone within reach Nurse Communication: Mobility status PT Visit Diagnosis: Muscle weakness (generalized) (M62.81);Other abnormalities of gait and mobility (R26.89);Pain Pain - Right/Left: Left Pain - part of body: Knee     Time: [id number] PT Time Calculation (min) (ACUTE ONLY): 42 min  Charges:  $Gait Training: 8-22 mins $Therapeutic Exercise: 8-22 mins $Therapeutic Activity: 8-22 mins                     Kreg Shropshire, DPT 03/25/2019, 11:28 AM

## 2019-03-25 NOTE — Anesthesia Postprocedure Evaluation (Signed)
Anesthesia Post Note  Patient: Alisha Sanchez  Procedure(s) Performed: COMPUTER ASSISTED TOTAL KNEE ARTHROPLASTY - RNFA (Left Knee)  Patient location during evaluation: Nursing Unit Anesthesia Type: Spinal Level of consciousness: oriented and awake and alert Pain management: pain level controlled Vital Signs Assessment: post-procedure vital signs reviewed and stable Respiratory status: spontaneous breathing and respiratory function stable Cardiovascular status: blood pressure returned to baseline and stable Postop Assessment: no headache, no backache, no apparent nausea or vomiting and patient able to bend at knees Anesthetic complications: no     Last Vitals:  Vitals:   03/25/19 0407 03/25/19 0750  BP: 122/68 128/66  Pulse: 69 70  Resp: 16 18  Temp: (!) 36.4 C 36.9 C  SpO2: 99% 100%    Last Pain:  Vitals:   03/25/19 0815  TempSrc:   PainSc: 0-No pain                 Caryl Asp

## 2019-03-25 NOTE — Progress Notes (Signed)
   Subjective: 1 Day Post-Op Procedure(s) (LRB): COMPUTER ASSISTED TOTAL KNEE ARTHROPLASTY - RNFA (Left) Patient reports pain as 0 on 0-10 scale.   Patient is well, and has had no acute complaints or problems Therapy started yesterday and was able to ambulate from bed to chair Plan is to go Home after hospital stay. no nausea and no vomiting Patient denies any chest pains or shortness of breath. Objective: Vital signs in last 24 hours: Temp:  [97.5 F (36.4 C)-98.2 F (36.8 C)] 97.5 F (36.4 C) (09/22 0407) Pulse Rate:  [62-83] 69 (09/22 0407) Resp:  [10-18] 16 (09/22 0407) BP: (94-134)/(58-78) 122/68 (09/22 0407) SpO2:  [96 %-100 %] 99 % (09/22 0407) Heels are non tender and elevated off the bed using rolled towels along with bone foam under operative heel  Intake/Output from previous day: 09/21 0701 - 09/22 0700 In: 2458 [I.V.:1958; IV Piggyback:500] Out: 2370 [Urine:2050; Drains:270; Blood:50] Intake/Output this shift: No intake/output data recorded.  No results for input(s): HGB in the last 72 hours. No results for input(s): WBC, RBC, HCT, PLT in the last 72 hours. No results for input(s): NA, K, CL, CO2, BUN, CREATININE, GLUCOSE, CALCIUM in the last 72 hours. No results for input(s): LABPT, INR in the last 72 hours.  EXAM General - Patient is Alert, Appropriate and Oriented Extremity - Neurologically intact Neurovascular intact Sensation intact distally Intact pulses distally Dorsiflexion/Plantar flexion intact Compartment soft Dressing - dressing C/D/I Motor Function - intact, moving foot and toes well on exam.    Past Medical History:  Diagnosis Date  . Arthritis    knees  . Cancer ([place])    Thyroid  . Complication of anesthesia   . Depression   . Diabetes mellitus type 2 in obese ([place])   . Dyspnea   . GERD (gastroesophageal reflux disease)   . Headache    migraines - 3-4x/yr  . HLD (hyperlipidemia)   . HTN (hypertension)   . Hypothyroidism    no  thyroid  . Localized swelling of both lower legs    leaky veins  . Lower extremity edema   . Neck pain    left side  . Numbness and tingling    numbness in hands and face tingles  . PONV (postoperative nausea and vomiting)   . Pulmonary embolus ([place])    after thyroid surgery  . Vertigo    no episodes in approx 2 yrs    Assessment/Plan: 1 Day Post-Op Procedure(s) (LRB): COMPUTER ASSISTED TOTAL KNEE ARTHROPLASTY - RNFA (Left) Active Problems:   Total knee replacement status  Estimated body mass index is 27.97 kg/m as calculated from the following:   Height as of this encounter: 5\' 7"  (1.702 m).   Weight as of this encounter: 81 kg. Advance diet Up with therapy D/C IV fluids Plan for discharge tomorrow Discharge home with home health  Labs: None DVT Prophylaxis - Lovenox, Foot Pumps and TED hose Weight-Bearing as tolerated to the left leg D/C O2 and Pulse OX and try on Room Air Begin working on bowel movement  Chriselda Leppert R. Saxon [place] 03/25/2019, 7:22 AM

## 2019-03-25 NOTE — Progress Notes (Signed)
Physical Therapy Treatment Patient Details Name: Alisha Sanchez MRN: [id number] DOB: [birth year] Today's Date: 03/25/2019    History of Present Illness 63 yo female s/p L TKA, WBAT. PMH of HTN, HLD, headaches, DM, depression, PE, vertigo, thyroid surgery, IVC filter placed (03/17/2019).    PT Comments    Pt continues to do well with PT post TKA.  She again reported extremely low pain at rest and though she had pain with ROM tasks was not limited due to pain t/o the session.  She was able to circumambulate the nurses' station with light UE use of walker and consistent and confident cadence.  She needed some cuing for safety with transfers but overall has surpassed typical POD1 milestones.     Follow Up Recommendations  Home health PT     Equipment Recommendations  Rolling walker with 5" wheels    Recommendations for Other Services       Precautions / Restrictions Precautions Precautions: Knee;Fall Precaution Booklet Issued: Yes (comment) Restrictions Weight Bearing Restrictions: Yes LLE Weight Bearing: Weight bearing as tolerated    Mobility  Bed Mobility Overal bed mobility: Modified Independent Bed Mobility: Supine to Sit;Sit to Supine     Supine to sit: Supervision Sit to supine: Supervision   General bed mobility comments: Pt able to get L LE off of and back onto bed w/o direct assist  Transfers Overall transfer level: Needs assistance Equipment used: Rolling walker (2 wheeled) Transfers: Sit to/from Stand Sit to Stand: Supervision         General transfer comment: Pt again needing some cuing to insure appropraite positioning to bed before transition, cues for walker use and foot placement, able to control ascent/descent t/o phyiscal assist  Ambulation/Gait Ambulation/Gait assistance: Supervision Gait Distance (Feet): 200 Feet Assistive device: Rolling walker (2 wheeled)       General Gait Details: Pt again able to assume consistent and confident  cadence relatively quickly with minimal reliance on the walker.  Pt with some fatigue and increased pain but able to maintain consistent speed and cadence the entire effort   Stairs             Wheelchair Mobility    Modified Rankin (Stroke Patients Only)       Balance Overall balance assessment: Needs assistance Sitting-balance support: Feet supported Sitting balance-Leahy Scale: Good     Standing balance support: Bilateral upper extremity supported Standing balance-Leahy Scale: Good                              Cognition Arousal/Alertness: Awake/alert Behavior During Therapy: WFL for tasks assessed/performed Overall Cognitive Status: Within Functional Limits for tasks assessed                                        Exercises Total Joint Exercises Ankle Circles/Pumps: AROM;10 reps;Both Quad Sets: Strengthening;15 reps Short Arc Quad: Strengthening;15 reps Heel Slides: Strengthening;10 reps(with resisted leg extensions) Hip ABduction/ADduction: Strengthening;15 reps Straight Leg Raises: AROM;10 reps Knee Flexion: PROM;5 reps Other Exercises Other Exercises: Pt instructed in falls prevention strategies, pet care considerations, polar care mgt, compression stocking mgt, AE/DME, and home/routines modifications for ADL tasks; handout provided    General Comments        Pertinent Vitals/Pain Pain Assessment: 0-10 Pain Score: 2  Pain Location: L knee(pain increases considerably with ROM tasks) Pain Descriptors /  Indicators: Sore;Aching Pain Intervention(s): Limited activity within patient's tolerance;Monitored during session;Ice applied;Repositioned    Home Living Family/patient expects to be discharged to:: Private residence Living Arrangements: Spouse/significant other Available Help at Discharge: Available 24 hours/day Type of Home: House Home Access: Stairs to enter Entrance Stairs-Rails: Right;Left Home Layout: Two level;Able  to live on main level with bedroom/bathroom Home Equipment: Kasandra Knudsen - single point;Walker - 4 wheels;Grab bars - tub/shower;Adaptive equipment Additional Comments: May be able to borrow a RW    Prior Function Level of Independence: Independent      Comments: no falls in the last 6 months. Indep with ADL and mobility.   PT Goals (current goals can now be found in the care plan section) Acute Rehab PT Goals Patient Stated Goal: go home Progress towards PT goals: Progressing toward goals    Frequency    BID      PT Plan Current plan remains appropriate    Co-evaluation              AM-PAC PT "6 Clicks" Mobility   Outcome Measure  Help needed turning from your back to your side while in a flat bed without using bedrails?: None Help needed moving from lying on your back to sitting on the side of a flat bed without using bedrails?: None Help needed moving to and from a bed to a chair (including a wheelchair)?: None Help needed standing up from a chair using your arms (e.g., wheelchair or bedside chair)?: None Help needed to walk in hospital room?: A Little Help needed climbing 3-5 steps with a railing? : A Little 6 Click Score: 22    End of Session Equipment Utilized During Treatment: Gait belt Activity Tolerance: Patient tolerated treatment well Patient left: with chair alarm set;with call bell/phone within reach Nurse Communication: Mobility status PT Visit Diagnosis: Muscle weakness (generalized) (M62.81);Other abnormalities of gait and mobility (R26.89);Pain Pain - Right/Left: Left Pain - part of body: Knee     Time: [id number] PT Time Calculation (min) (ACUTE ONLY): 32 min  Charges:  $Gait Training: 8-22 mins $Therapeutic Exercise: 8-22 mins                     Kreg Shropshire, DPT 03/25/2019, 3:56 PM

## 2019-03-25 NOTE — Evaluation (Signed)
Occupational Therapy Evaluation Patient Details Name: Alisha Sanchez MRN: [id number] DOB: Sep 25, 1955 Today's Date: 03/25/2019    History of Present Illness Patient is 63 yo female s/p L TKA, WBAT. PMH of HTN, HLD, headaches, DM, depression, PE, vertigo, thyroid surgery, IVC filter placed (03/17/2019).   Clinical Impression   Pt seen for OT evaluation this date, POD#1 from above surgery. Pt was independent in all ADL prior to surgery and is eager to return to PLOF with less pain and improved safety and independence. Pt currently requires minimal assist for LB dressing while in seated position due to pain and limited AROM of L knee. Pt instructed in polar care mgt, falls prevention strategies, home/routines modifications, DME/AE for LB bathing and dressing tasks, pet care considerations, and compression stocking mgt. Pt verbalized understanding; handout provided to support recall and carryover. Pt left seated on BSC for attempts at a BM. Was able to urinate. RN notified of both and pt's position. Call bell left with pt instructed to call for assist once she was done. Pt verbalized understanding. Do not anticipate additional need for skilled acute OT services. Pt has adequate assist at home. Will sign off. Please re-consult if additional needs arise.       Follow Up Recommendations  No OT follow up    Equipment Recommendations  3 in 1 bedside commode    Recommendations for Other Services       Precautions / Restrictions Precautions Precautions: Knee;Fall Precaution Booklet Issued: Yes (comment) Restrictions Weight Bearing Restrictions: Yes LLE Weight Bearing: Weight bearing as tolerated      Mobility Bed Mobility     General bed mobility comments: deferred, up in recliner at start of session, seated on BSC at end of session with call bell in reach  Transfers Overall transfer level: Needs assistance Equipment used: Rolling walker (2 wheeled) Transfers: Sit to/from Stand Sit  to Stand: Supervision         General transfer comment: Pt was able to rise to standing w/o phyiscal assist, minimal cuing for set up/hand placement    Balance Overall balance assessment: Needs assistance Sitting-balance support: Feet supported Sitting balance-Leahy Scale: Good Sitting balance - Comments: Pt able to maintain sitting balance w/o issue.   Standing balance support: Bilateral upper extremity supported Standing balance-Leahy Scale: Good Standing balance comment: reliant on walker, but no safety issues with standing acts                           ADL either performed or assessed with clinical judgement   ADL                                         General ADL Comments: Min A LB ADL, CGA toilet transfers BSC over toilet, LRAD for amb, Mod A for compression stocking mgt     Vision Baseline Vision/History: Wears glasses Wears Glasses: At all times Patient Visual Report: No change from baseline Vision Assessment?: No apparent visual deficits     Perception     Praxis      Pertinent Vitals/Pain Pain Assessment: 0-10 Pain Score: 3  Pain Location: L knee Pain Descriptors / Indicators: Sore;Aching Pain Intervention(s): Limited activity within patient's tolerance;Monitored during session;Ice applied;Repositioned     Hand Dominance Right   Extremity/Trunk Assessment Upper Extremity Assessment Upper Extremity Assessment: Overall WFL for tasks assessed  Lower Extremity Assessment Lower Extremity Assessment: LLE deficits/detail(expected post-op strength/ROM deficits but minimal functional deficits)   Cervical / Trunk Assessment Cervical / Trunk Assessment: Normal   Communication Communication Communication: No difficulties   Cognition Arousal/Alertness: Awake/alert Behavior During Therapy: WFL for tasks assessed/performed Overall Cognitive Status: Within Functional Limits for tasks assessed                                      General Comments       Exercises Other Exercises: Pt instructed in falls prevention strategies, pet care considerations, polar care mgt, compression stocking mgt, AE/DME, and home/routines modifications for ADL tasks; handout provided   Shoulder Instructions      Home Living Family/patient expects to be discharged to:: Private residence Living Arrangements: Spouse/significant other Available Help at Discharge: Available 24 hours/day Type of Home: House Home Access: Stairs to enter [employer] of Steps: 2 Entrance Stairs-Rails: Right;Left Home Layout: Two level;Able to live on main level with bedroom/bathroom     Bathroom Shower/Tub: Tub/shower unit   Bathroom Toilet: Handicapped height     Home Equipment: [place] - single point;Walker - 4 wheels;Grab bars - tub/shower;Adaptive equipment Adaptive Equipment: Reacher Additional Comments: May be able to borrow a RW      Prior Functioning/Environment Level of Independence: Independent        Comments: no falls in the last 6 months. Indep with ADL and mobility.        OT Problem List: Pain;Decreased strength;Impaired balance (sitting and/or standing)      OT Treatment/Interventions:      OT Goals(Current goals can be found in the care plan section) Acute Rehab OT Goals Patient Stated Goal: go home OT Goal Formulation: All assessment and education complete, DC therapy  OT Frequency:     Barriers to D/C:            Co-evaluation              AM-PAC OT "6 Clicks" Daily Activity     Outcome Measure Help from another person eating meals?: None Help from another person taking care of personal grooming?: None Help from another person toileting, which includes using toliet, bedpan, or urinal?: A Little Help from another person bathing (including washing, rinsing, drying)?: A Little Help from another person to put on and taking off regular upper body clothing?: None Help from another person  to put on and taking off regular lower body clothing?: A Little 6 Click Score: 21   End of Session Equipment Utilized During Treatment: Rolling walker;Gait belt Nurse Communication: (IV completed, pt seated on BSC)  Activity Tolerance: Patient tolerated treatment well Patient left: Other (comment)(seated on BSC to attempt BM, call bell in reach, instructed to press for assist when done)  OT Visit Diagnosis: Other abnormalities of gait and mobility (R26.89);Pain Pain - Right/Left: Left Pain - part of body: Knee                Time: [id number] OT Time Calculation (min): 22 min Charges:  OT General Charges $OT Visit: 1 Visit OT Evaluation $OT Eval Low Complexity: 1 Low OT Treatments $Self Care/Home Management : 8-22 mins  Jeni Salles, MPH, MS, OTR/L ascom [phone] 03/25/19, 2:00 PM

## 2019-03-25 NOTE — Discharge Summary (Signed)
Physician Discharge Summary  Patient ID: Alisha Sanchez MRN: [id number] DOB/AGE: [birth year] 63 y.o.  Admit date: 03/24/2019 Discharge date: 03/26/2019  Admission Diagnoses:  PRIMARY OSTEOARTHRITIS OF LEFT KNEE   Discharge Diagnoses: Patient Active Problem List   Diagnosis Date Noted  . Diabetes mellitus type 2, uncomplicated ([place]) [id number]  . GERD (gastroesophageal reflux disease) 03/24/2019  . Menorrhagia 03/24/2019  . Migraines 03/24/2019  . Pulmonary embolism ([place]) 03/24/2019  . Thyroid cancer ([place]) 03/24/2019  . Total knee replacement status 03/24/2019  . Primary osteoarthritis of left shoulder 11/18/2018  . Rotator cuff tendinitis, left 10/30/2018  . Traumatic complete tear of left rotator cuff 10/30/2018  . History of pulmonary embolus (PE) 08/04/2018  . Primary osteoarthritis of left knee 05/14/2018  . Chronic pain of left knee 05/14/2018  . Chronic pain syndrome 05/14/2018  . Diabetes ([place]) 09/22/2009  . Hyperlipidemia 09/22/2009  . Essential hypertension 09/22/2009  . FATIGUE / MALAISE 09/22/2009  . EDEMA 09/22/2009  . CHEST PAIN UNSPECIFIED 09/22/2009    Past Medical History:  Diagnosis Date  . Arthritis    knees  . Cancer ([place])    Thyroid  . Complication of anesthesia   . Depression   . Diabetes mellitus type 2 in obese ([place])   . Dyspnea   . GERD (gastroesophageal reflux disease)   . Headache    migraines - 3-4x/yr  . HLD (hyperlipidemia)   . HTN (hypertension)   . Hypothyroidism    no thyroid  . Localized swelling of both lower legs    leaky veins  . Lower extremity edema   . Neck pain    left side  . Numbness and tingling    numbness in hands and face tingles  . PONV (postoperative nausea and vomiting)   . Pulmonary embolus ([place])    after thyroid surgery  . Vertigo    no episodes in approx 2 yrs     Transfusion: No transfusions during this admission   Consultants (if any): None  Discharged Condition: Improved  Hospital  Course: Alisha Sanchez is an 63 y.o. female who was admitted 03/24/2019 with a diagnosis of degenerative arthrosis left knee and went to the operating room on 03/24/2019 and underwent the above named procedures.    Surgeries:Procedure(s): COMPUTER ASSISTED TOTAL KNEE ARTHROPLASTY - RNFA on 03/24/2019  PRE-OPERATIVE DIAGNOSIS: Degenerative arthrosis of the left knee, primary  POST-OPERATIVE DIAGNOSIS:  Same  PROCEDURE:  Left total knee arthroplasty using computer-assisted navigation  SURGEON:  Marciano Sequin. M.D.  ASSISTANT: Alisha Smiles, PA-C (present and scrubbed throughout the case, critical for assistance with exposure, retraction, instrumentation, and closure)  ANESTHESIA: spinal  ESTIMATED BLOOD LOSS: 50 mL  FLUIDS REPLACED: 1000 mL of crystalloid  TOURNIQUET TIME: 99 minutes  DRAINS: 2 medium Hemovac drains  SOFT TISSUE RELEASES: Anterior cruciate ligament, posterior cruciate ligament, deep medial collateral ligament, patellofemoral ligament  IMPLANTS UTILIZED: DePuy Attune size 5N posterior stabilized femoral component (cemented), size 4 rotating platform tibial component (cemented), 38 mm medialized dome patella (cemented), and a 5 mm stabilized rotating platform polyethylene insert.  INDICATIONS FOR SURGERY: Alisha Sanchez is a 63 y.o. year old female with a long history of progressive knee pain. X-rays demonstrated severe degenerative changes in tricompartmental fashion. The patient had not seen any significant improvement despite conservative nonsurgical intervention. After discussion of the risks and benefits of surgical intervention, the patient expressed understanding of the risks benefits and agree with plans for total knee arthroplasty.   The risks,  benefits, and alternatives were discussed at length including but not limited to the risks of infection, bleeding, nerve injury, stiffness, blood clots, the need for revision surgery, cardiopulmonary  complications, among others, and they were willing to proceed. Patient tolerated the surgery well. No complications .Patient was taken to PACU where she was stabilized and then transferred to the orthopedic floor.  Patient started on Lovenox 30 mg q 12 hrs. Foot pumps applied bilaterally at 80 mm hgb. Heels elevated off bed with rolled towels. No evidence of DVT. Calves non tender. Negative [place]. Physical therapy started on day #1 for gait training and transfer with OT starting on  day #1 for ADL and assisted devices. Patient has done well with therapy. Ambulated 200 feet upon being discharged.  Was able to ascend and descend 4 steps safely and independently  Patient's IV And Foley were discontinued on day #1 with Hemovac being discontinued on day #2. Dressing was changed on day 2 prior to patient being discharged   She was given perioperative antibiotics:  Anti-infectives (From admission, onward)   Start     Dose/Rate Route Frequency Ordered Stop   03/24/19 1430  ceFAZolin (ANCEF) IVPB 2g/100 mL premix     2 g 200 mL/hr over 30 Minutes Intravenous Every 6 hours 03/24/19 1335 03/25/19 1129   03/24/19 0617  ceFAZolin (ANCEF) 2-4 GM/100ML-% IVPB    Note to Pharmacy: Dewayne Hatch   : cabinet override      03/24/19 0617 03/24/19 0730   03/24/19 0600  ceFAZolin (ANCEF) IVPB 2g/100 mL premix     2 g 200 mL/hr over 30 Minutes Intravenous On call to O.R. 03/24/19 [id number] 03/24/19 0742    .  She was fitted with AV 1 compression foot pump devices, instructed on heel pumps, early ambulation, and fitted with TED stockings bilaterally for DVT prophylaxis.  She benefited maximally from the hospital stay and there were no complications.    Recent vital signs:  Vitals:   03/24/19 2309 03/25/19 0407  BP: 133/66 122/68  Pulse: 75 69  Resp: 16 16  Temp: 97.6 F (36.4 C) (!) 97.5 F (36.4 C)  SpO2: 99% 99%    Recent laboratory studies:  Lab Results  Component Value Date   HGB 13.0 03/07/2019    HGB 13.6 10/27/2015   HGB 13.8 08/11/2013   Lab Results  Component Value Date   WBC 7.0 03/07/2019   PLT 350 03/07/2019   Lab Results  Component Value Date   INR 0.9 03/07/2019   Lab Results  Component Value Date   NA 138 03/07/2019   K 3.4 (L) 03/07/2019   CL 99 03/07/2019   CO2 26 03/07/2019   BUN 13 03/07/2019   CREATININE 0.56 03/07/2019   GLUCOSE 263 (H) 03/07/2019    Discharge Medications:   Allergies as of 03/25/2019   No Known Allergies     Medication List    STOP taking these medications   aspirin 325 MG EC tablet   ibuprofen 200 MG tablet Commonly known as: ADVIL     TAKE these medications   alendronate 70 MG tablet Commonly known as: FOSAMAX Take 70 mg by mouth once a week. Take with a full glass of water on an empty stomach.   amLODipine 5 MG tablet Commonly known as: NORVASC Take 5 mg by mouth at bedtime.   enoxaparin 40 MG/0.4ML injection Commonly known as: LOVENOX Inject 0.4 mLs (40 mg total) into the skin daily for 14 days. Start taking  on: March 27, 2019   hydrochlorothiazide 25 MG tablet Commonly known as: HYDRODIURIL Take 25 mg by mouth daily.   Melatonin 10 MG Tabs Take 1 tablet by mouth at bedtime as needed.   MULTIVITAMIN ADULT PO Take 2 tablets by mouth daily.   oxyCODONE 5 MG immediate release tablet Commonly known as: Oxy IR/ROXICODONE Take 1 tablet (5 mg total) by mouth every 4 (four) hours as needed for moderate pain (pain score 4-6).   pantoprazole 40 MG tablet Commonly known as: PROTONIX Take 40 mg by mouth daily.   rosuvastatin 10 MG tablet Commonly known as: CRESTOR Take 10 mg by mouth at bedtime.   sitaGLIPtin-metformin 50-500 MG tablet Commonly known as: JANUMET Take 1 tablet by mouth 2 (two) times daily with a meal.   Synthroid 125 MCG tablet Generic drug: levothyroxine Take 125 mcg by mouth daily before breakfast.   traMADol 50 MG tablet Commonly known as: ULTRAM Take 1-2 tablets (50-100 mg  total) by mouth every 6 (six) hours as needed for moderate pain.            Durable Medical Equipment  (From admission, onward)         Start     Ordered   03/24/19 1336  DME Walker rolling  Once    Question:  Patient needs a walker to treat with the following condition  Answer:  Total knee replacement status   03/24/19 1335   03/24/19 1336  DME Bedside commode  Once    Question:  Patient needs a bedside commode to treat with the following condition  Answer:  Total knee replacement status   03/24/19 1335          Diagnostic Studies: Dg Knee Left Port  Result Date: 03/24/2019 CLINICAL DATA:  S/p total left knee replacement. EXAM: PORTABLE LEFT KNEE - 1-2 VIEW COMPARISON:  03/06/2011 FINDINGS: LEFT knee total arthroplasty. Prosthetic components appear well seated. No fracture . Surgical drain in the suprapatellar bursa. Skin staples noted. IMPRESSION: No complication following total knee arthroplasty. Electronically Signed   By: Suzy Bouchard M.D.   On: 03/24/2019 11:54    Disposition:   Discharge Instructions    Diet - low sodium heart healthy   Complete by: As directed    Increase activity slowly   Complete by: As directed       Follow-up Information    Watt Climes, PA On 04/07/2019.   Specialty: Physician Assistant Why: at 10:15am Contact information: [place] [address] [phone]        Dereck Leep, MD On 05/06/2019.   Specialty: Orthopedic Surgery Why: at 10:00am Contact information: [place] [address] [phone]            Signed: Watt Climes 03/25/2019, 7:31 AM

## 2019-03-26 LAB — GLUCOSE, CAPILLARY
Glucose-Capillary: 213 mg/dL — ABNORMAL HIGH (ref 70–99)
Glucose-Capillary: 253 mg/dL — ABNORMAL HIGH (ref 70–99)

## 2019-03-26 NOTE — Progress Notes (Signed)
Discharge instructions reviewed with patient. Verbalized understanding. IV removed. Stable condition. Operative site washed and dressing changed. Thigh high TEDs applied. 2 honeycomb dressings given to patient to take home. She is now eating lunch then she will be ready to go home.

## 2019-03-26 NOTE — Progress Notes (Addendum)
Physical Therapy Treatment Patient Details Name: Alisha Sanchez MRN: [id number] DOB: 1956-03-13 Today's Date: 03/26/2019    History of Present Illness 63 yo female s/p L TKA, WBAT. PMH of HTN, HLD, headaches, DM, depression, PE, vertigo, thyroid surgery, IVC filter placed (03/17/2019).    PT Comments    Pt agreeable to PT although notes feeling quite drowsy post pain medication. Pt reports L knee pain is 3/10. PT demonstrates bed mobility in/out of bed with Mod I using mild increased time. STS with rolling walker with adjusted height and education on setting for appropriate height (pt will be borrowing a rolling walker) with Min guard for safety due to medication affects; otherwise supervision. Pt ambulates in room a total of 70 ft with Min guard and stair training for 2 STE using sideways technique with Min guard and education on sequencing/technique. Set up/supervision for toileting. Education and performance of stretching for flexion/extension and QS strengthening. Current L knee active range of motion 2-85 degrees. Pt's questions regarding activity, cryotherapy and HHPT answered to pt satisfaction. Pt plans to discharge home today.    Follow Up Recommendations  Home health PT     Equipment Recommendations  Rolling walker with 5" wheels (pt would like a 3 in 1)     Recommendations for Other Services       Precautions / Restrictions Precautions Precautions: Knee;Fall Restrictions Weight Bearing Restrictions: Yes LLE Weight Bearing: Weight bearing as tolerated    Mobility  Bed Mobility Overal bed mobility: Modified Independent       Supine to sit: Modified independent (Device/Increase time) Sit to supine: Modified independent (Device/Increase time)   General bed mobility comments: Mild increased time  Transfers Overall transfer level: Needs assistance Equipment used: Rolling walker (2 wheeled) Transfers: Sit to/from Stand Sit to Stand: Supervision;Min guard          General transfer comment: to/from bed and BSC. Education on use of LLE for strength/stretch  Ambulation/Gait Ambulation/Gait assistance: Min guard Gait Distance (Feet): 70 Feet Assistive device: Rolling walker (2 wheeled) Gait Pattern/deviations: Step-through pattern;Decreased stride length     General Gait Details: Encouraged wt shift with L QS several times prior to gait. Generally good reciprocal pattern with decreased stride and mild antalgia.   Stairs Stairs: Yes Stairs assistance: Min guard Stair Management: One rail Left;Step to pattern;Sideways Number of Stairs: 2 General stair comments: Education regarding sequence. Pt demonstrates well   Wheelchair Mobility    Modified Rankin (Stroke Patients Only)       Balance   Sitting-balance support: Feet supported Sitting balance-Leahy Scale: Normal     Standing balance support: Bilateral upper extremity supported Standing balance-Leahy Scale: Fair(Fair +) Standing balance comment: Steady, but feels "off" due to medicaiton/feeling drowsy                            Cognition Arousal/Alertness: Awake/alert Behavior During Therapy: WFL for tasks assessed/performed Overall Cognitive Status: Within Functional Limits for tasks assessed                                 General Comments: Feeling tired/drowsy from pain medication      Exercises Total Joint Exercises Ankle Circles/Pumps: AROM;Both;10 reps Quad Sets: Strengthening;Left;20 reps Knee Flexion: AROM;Left;10 reps;Seated(3 positions each rep with hold) Goniometric ROM: 2-85 Other Exercises Other Exercises: Education on use of cryotherapy Other Exercises: Educated on flex/ext stretching and carryover  to home. Answered all questions regarding home therapy and expected activity level.  Other Exercises: Set up/supervision of toileting    General Comments        Pertinent Vitals/Pain Pain Assessment: 0-10 Pain Score: 3  Pain  Location: L knee Pain Descriptors / Indicators: Aching;Constant;Discomfort Pain Intervention(s): Monitored during session;Premedicated before session;Ice applied    Home Living                      Prior Function            PT Goals (current goals can now be found in the care plan section) Progress towards PT goals: Progressing toward goals    Frequency    BID      PT Plan Current plan remains appropriate    Co-evaluation              AM-PAC PT "6 Clicks" Mobility   Outcome Measure  Help needed turning from your back to your side while in a flat bed without using bedrails?: None Help needed moving from lying on your back to sitting on the side of a flat bed without using bedrails?: None Help needed moving to and from a bed to a chair (including a wheelchair)?: None Help needed standing up from a chair using your arms (e.g., wheelchair or bedside chair)?: A Little Help needed to walk in hospital room?: A Little Help needed climbing 3-5 steps with a railing? : A Little 6 Click Score: 21    End of Session Equipment Utilized During Treatment: Gait belt Activity Tolerance: Patient tolerated treatment well Patient left: in bed;with call bell/phone within reach;with bed alarm set;with SCD's reapplied;Other (comment)(polar care)   PT Visit Diagnosis: Muscle weakness (generalized) (M62.81);Other abnormalities of gait and mobility (R26.89);Pain Pain - Right/Left: Left Pain - part of body: Knee     Time: [id number] PT Time Calculation (min) (ACUTE ONLY): 40 min  Charges:  $Gait Training: 8-22 mins $Therapeutic Exercise: 8-22 mins $Therapeutic Activity: 8-22 mins                      Alisha Sanchez, PTA 03/26/2019, 10:29 AM

## 2019-03-26 NOTE — Progress Notes (Signed)
   Subjective: 2 Days Post-Op Procedure(s) (LRB): COMPUTER ASSISTED TOTAL KNEE ARTHROPLASTY - RNFA (Left) Patient reports pain as 5 on 0-10 scale.   Patient is well, and has had no acute complaints or problems Patient did very well with therapy yesterday.  Was able to ambulate around the nurses desk.  Still needs to do steps prior to being discharged Plan is to go Home after hospital stay. no nausea and no vomiting Patient denies any chest pains or shortness of breath. Objective: Vital signs in last 24 hours: Temp:  [97.7 F (36.5 C)-98.4 F (36.9 C)] 97.7 F (36.5 C) (09/22 2353) Pulse Rate:  [70-87] 87 (09/22 2353) Resp:  [18] 18 (09/22 2353) BP: (124-153)/(66-74) 153/74 (09/22 2353) SpO2:  [96 %-100 %] 96 % (09/22 2353) well approximated incision Heels are non tender and elevated off the bed using rolled towels Intake/Output from previous day: 09/22 0701 - 09/23 0700 In: 1101.6 [P.O.:360; I.V.:741.6] Out: 270 [Drains:270] Intake/Output this shift: Total I/O In: 120 [P.O.:120] Out: 120 [Drains:120]  No results for input(s): HGB in the last 72 hours. No results for input(s): WBC, RBC, HCT, PLT in the last 72 hours. No results for input(s): NA, K, CL, CO2, BUN, CREATININE, GLUCOSE, CALCIUM in the last 72 hours. No results for input(s): LABPT, INR in the last 72 hours.  EXAM General - Patient is Alert, Appropriate and Oriented Extremity - Neurologically intact Neurovascular intact Sensation intact distally Intact pulses distally Dorsiflexion/Plantar flexion intact No cellulitis present Compartment soft Dressing - dressing C/D/I Motor Function - intact, moving foot and toes well on exam.    Past Medical History:  Diagnosis Date  . Arthritis    knees  . Cancer ([place])    Thyroid  . Complication of anesthesia   . Depression   . Diabetes mellitus type 2 in obese ([place])   . Dyspnea   . GERD (gastroesophageal reflux disease)   . Headache    migraines - 3-4x/yr  .  HLD (hyperlipidemia)   . HTN (hypertension)   . Hypothyroidism    no thyroid  . Localized swelling of both lower legs    leaky veins  . Lower extremity edema   . Neck pain    left side  . Numbness and tingling    numbness in hands and face tingles  . PONV (postoperative nausea and vomiting)   . Pulmonary embolus ([place])    after thyroid surgery  . Vertigo    no episodes in approx 2 yrs    Assessment/Plan: 2 Days Post-Op Procedure(s) (LRB): COMPUTER ASSISTED TOTAL KNEE ARTHROPLASTY - RNFA (Left) Active Problems:   Total knee replacement status  Estimated body mass index is 27.97 kg/m as calculated from the following:   Height as of this encounter: 5\' 7"  (1.702 m).   Weight as of this encounter: 81 kg. Up with therapy Discharge home with home health  Labs: None DVT Prophylaxis - Lovenox, Foot Pumps and TED hose Weight-Bearing as tolerated to left leg Please wash operative leg, change dressing and apply TED stockings to both legs prior to being discharged Please give the patient 2 extra honeycomb dressings take home Patient needs to do steps prior to being discharged  [place]. [place] 03/26/2019, 6:52 AM

## 2019-03-26 NOTE — TOC Transition Note (Addendum)
Transition of Care [facility]) - CM/SW Discharge Note   Patient Details  Name: Alisha Sanchez MRN: [id number] Date of Birth: Nov 27, 1955  Transition of Care [facility]) CM/SW Contact:  Su Hilt, RN Phone Number: 03/26/2019, 9:54 AM   Clinical Narrative:    [facility]. Her husband provides transportation and her sister will be helping her at home.  She stated that she now does need 3 in 1 and RW, she was going to borrow one but it fell thru.  I notified Brad with [employer] to bring the DME to the room, he stated he would be here with it around 1230, her Lovenox is 0$, no additional needs   Final next level of care: [place] Barriers to Discharge: Barriers Resolved   Patient Goals and CMS Choice Patient states their goals for this hospitalization and ongoing recovery are:: go home with her husband      Discharge Placement                       Discharge Plan and Services   Discharge Planning Services: CM Consult            DME Arranged: N/A         HH Arranged: PT [place] Agency: [facility] (formerly [employer]) Date [place]: 03/25/19 Time [place]: 1218 Representative spoke with at [place]: [place] ([place]) Interventions     Readmission Risk Interventions No flowsheet data found.

## 2019-05-07 ENCOUNTER — Other Ambulatory Visit (INDEPENDENT_AMBULATORY_CARE_PROVIDER_SITE_OTHER): Payer: Self-pay | Admitting: Vascular Surgery

## 2019-05-07 DIAGNOSIS — Z95828 Presence of other vascular implants and grafts: Secondary | ICD-10-CM

## 2019-05-13 ENCOUNTER — Other Ambulatory Visit: Payer: Self-pay

## 2019-05-13 ENCOUNTER — Encounter (INDEPENDENT_AMBULATORY_CARE_PROVIDER_SITE_OTHER): Payer: Self-pay | Admitting: Vascular Surgery

## 2019-05-13 ENCOUNTER — Encounter (INDEPENDENT_AMBULATORY_CARE_PROVIDER_SITE_OTHER): Payer: Self-pay

## 2019-05-13 ENCOUNTER — Ambulatory Visit (INDEPENDENT_AMBULATORY_CARE_PROVIDER_SITE_OTHER): Payer: BC Managed Care – PPO | Admitting: Vascular Surgery

## 2019-05-13 ENCOUNTER — Ambulatory Visit (INDEPENDENT_AMBULATORY_CARE_PROVIDER_SITE_OTHER): Payer: BC Managed Care – PPO

## 2019-05-13 VITALS — BP 122/77 | HR 79 | Resp 16 | Wt 178.4 lb

## 2019-05-13 DIAGNOSIS — E119 Type 2 diabetes mellitus without complications: Secondary | ICD-10-CM | POA: Diagnosis not present

## 2019-05-13 DIAGNOSIS — Z95828 Presence of other vascular implants and grafts: Secondary | ICD-10-CM

## 2019-05-13 DIAGNOSIS — I2699 Other pulmonary embolism without acute cor pulmonale: Secondary | ICD-10-CM

## 2019-05-13 DIAGNOSIS — I10 Essential (primary) hypertension: Secondary | ICD-10-CM

## 2019-05-13 NOTE — Assessment & Plan Note (Signed)
The patient has largely recovered from her knee replacement and does not have a DVT.  Her filter can now be removed safely.  Risks and benefits were discussed with the patient and she is agreeable to proceed with IVC filter removal.

## 2019-05-13 NOTE — Progress Notes (Signed)
MRN : [id number]  Alisha Sanchez is a 63 y.o. (April 15, 1956) female who presents with chief complaint of  Chief Complaint  Patient presents with  . Follow-up    [facility] 8week with ultrasound   .  History of Present Illness: Patient returns today in follow up of her IVC filter placement preliminary to her knee replacement about 2 months ago.  She is doing well.  Her mobility after her knee replacement has returned to nearly normal at this point.  She had no periprocedural complications with either the filter placement or the knee surgery.  She is doing well today.  Duplex today shows no evidence of DVT or superficial thrombophlebitis in either lower extremity.  Current Outpatient Medications  Medication Sig Dispense Refill  . alendronate (FOSAMAX) 70 MG tablet Take 70 mg by mouth once a week. Take with a full glass of water on an empty stomach.    Marland Kitchen amLODipine (NORVASC) 5 MG tablet Take 5 mg by mouth at bedtime.     . hydrochlorothiazide (HYDRODIURIL) 25 MG tablet Take 25 mg by mouth daily.    . Melatonin 10 MG TABS Take 1 tablet by mouth at bedtime as needed.    . Multiple Vitamins-Minerals (MULTIVITAMIN ADULT PO) Take 2 tablets by mouth daily.     Marland Kitchen oxyCODONE (OXY IR/ROXICODONE) 5 MG immediate release tablet Take 1 tablet (5 mg total) by mouth every 4 (four) hours as needed for moderate pain (pain score 4-6). 30 tablet 0  . pantoprazole (PROTONIX) 40 MG tablet Take 40 mg by mouth daily.    . rosuvastatin (CRESTOR) 10 MG tablet Take 10 mg by mouth at bedtime.     . sitaGLIPtin-metformin (JANUMET) 50-500 MG tablet Take 1 tablet by mouth 2 (two) times daily with a meal.    . SYNTHROID 125 MCG tablet Take 125 mcg by mouth daily before breakfast.   10  . traMADol (ULTRAM) 50 MG tablet Take 1-2 tablets (50-100 mg total) by mouth every 6 (six) hours as needed for moderate pain. 60 tablet 0  . enoxaparin (LOVENOX) 40 MG/0.4ML injection Inject 0.4 mLs (40 mg total) into the skin daily for 14  days. 14 mL 0   No current facility-administered medications for this visit.     Past Medical History:  Diagnosis Date  . Arthritis    knees  . Cancer ([place])    Thyroid  . Complication of anesthesia   . Depression   . Diabetes mellitus type 2 in obese ([place])   . Dyspnea   . GERD (gastroesophageal reflux disease)   . Headache    migraines - 3-4x/yr  . HLD (hyperlipidemia)   . HTN (hypertension)   . Hypothyroidism    no thyroid  . Localized swelling of both lower legs    leaky veins  . Lower extremity edema   . Neck pain    left side  . Numbness and tingling    numbness in hands and face tingles  . PONV (postoperative nausea and vomiting)   . Pulmonary embolus ([place])    after thyroid surgery  . Vertigo    no episodes in approx 2 yrs    Past Surgical History:  Procedure Laterality Date  . c-sections     x2   . CATARACT EXTRACTION W/PHACO Left 08/27/2017   Procedure: CATARACT EXTRACTION PHACO AND INTRAOCULAR LENS PLACEMENT ([place]) LEFT DIABETIC;  Surgeon: Leandrew Koyanagi, MD;  Location: [facility];  Service: Ophthalmology;  Laterality: Left;  Diabetic -  oral meds  . COLONOSCOPY N/A 11/10/2015   Procedure: COLONOSCOPY;  Surgeon: Hulen Luster, MD;  Location: [facility];  Service: Gastroenterology;  Laterality: N/A;  . ESOPHAGOGASTRODUODENOSCOPY N/A 11/10/2015   Procedure: ESOPHAGOGASTRODUODENOSCOPY (EGD) with dialation;  Surgeon: Hulen Luster, MD;  Location: [facility];  Service: Gastroenterology;  Laterality: N/A;  . IVC FILTER INSERTION N/A 03/17/2019   Procedure: IVC FILTER INSERTION;  Surgeon: Algernon Huxley, MD;  Location: [facility];  Service: Cardiovascular;  Laterality: N/A;  . KNEE ARTHROPLASTY Left 03/24/2019   Procedure: COMPUTER ASSISTED TOTAL KNEE ARTHROPLASTY - RNFA;  Surgeon: Dereck Leep, MD;  Location: [facility];  Service: Orthopedics;  Laterality: Left;  . SHOULDER ARTHROSCOPY Left 11/15/2018   Procedure: ARTHROSCOPY SHOULDER  WITH DEBRIDEMENT DECOMPRESSION, ROTATOR CUFF REPAIR AND POSSIBLE BICEPS TENODESIS;  Surgeon: Corky Mull, MD;  Location: [place] [facility];  Service: Orthopedics;  Laterality: Left;  SM[facility]PHE[facility]  . THYROIDECTOMY       Social History   Tobacco Use  . Smoking status: Never Smoker  . Smokeless tobacco: Never Used  . Tobacco comment: tobacco use- no  Substance Use Topics  . Alcohol use: Yes    Alcohol/week: 1.0 standard drinks    Types: 1 Glasses of wine per week    Comment: occassional wine  . Drug use: No      Family History  Problem Relation Age of Onset  . Liver cancer Father   . Lung cancer Father   . Diabetes Father   . Stroke Other   . Breast cancer Maternal Aunt 32  . Breast cancer Paternal Aunt 58    No Known Allergies   REVIEW OF SYSTEMS (Negative unless checked)  Constitutional: [] Weight loss  [] Fever  [] Chills Cardiac: [] Chest pain   [] Chest pressure   [] Palpitations   [] Shortness of breath when laying flat   [] Shortness of breath at rest   [] Shortness of breath with exertion. Vascular:  [] Pain in legs with walking   [] Pain in legs at rest   [] Pain in legs when laying flat   [] Claudication   [] Pain in feet when walking  [] Pain in feet at rest  [] Pain in feet when laying flat   [x] History of DVT   [x] Phlebitis   [x] Swelling in legs   [] Varicose veins   [] Non-healing ulcers Pulmonary:   [] Uses home oxygen   [] Productive cough   [] Hemoptysis   [] Wheeze  [] COPD   [] Asthma Neurologic:  [] Dizziness  [] Blackouts   [] Seizures   [] History of stroke   [] History of TIA  [] Aphasia   [] Temporary blindness   [] Dysphagia   [] Weakness or numbness in arms   [] Weakness or numbness in legs Musculoskeletal:  [x] Arthritis   [] Joint swelling   [x] Joint pain   [] Low back pain Hematologic:  [] Easy bruising  [] Easy bleeding   [] Hypercoagulable state   [] Anemic   Gastrointestinal:  [] Blood in stool   [] Vomiting blood  [] Gastroesophageal reflux/heartburn   [] Abdominal  pain Genitourinary:  [] Chronic kidney disease   [] Difficult urination  [] Frequent urination  [] Burning with urination   [] Hematuria Skin:  [] Rashes   [] Ulcers   [] Wounds Psychological:  [] History of anxiety   []  History of major depression.  Physical Examination  BP 122/77 (BP Location: Left Arm)   Pulse 79   Resp 16   Wt 178 lb 6.4 oz (80.9 kg)   BMI 27.94 kg/m  Gen:  WD/WN, NAD Head: [place]/AT, No temporalis wasting. Ear/Nose/Throat: Hearing  grossly intact, nares w/o erythema or drainage Eyes: Conjunctiva clear. Sclera non-icteric Neck: Supple.  Trachea midline Pulmonary:  Good air movement, no use of accessory muscles.  Cardiac: RRR, no JVD Vascular:  Vessel Right Left  Radial Palpable Palpable                           Musculoskeletal: M/S 5/5 throughout.  No deformity or atrophy.  Mild bilateral lower extremity edema. Neurologic: Sensation grossly intact in extremities.  Symmetrical.  Speech is fluent.  Psychiatric: Judgment intact, Mood & affect appropriate for pt's clinical situation. Dermatologic: No rashes or ulcers noted.  No cellulitis or open wounds.       Labs Recent Results (from the past 2160 hour(s))  APTT     Status: None   Collection Time: 03/07/19  3:07 PM  Result Value Ref Range   aPTT 29 24 - 36 seconds    Comment: Performed at [facility], [address] [place], [place] [address]  CBC     Status: None   Collection Time: 03/07/19  3:07 PM  Result Value Ref Range   WBC 7.0 4.0 - 10.5 K/uL   RBC 4.77 3.87 - 5.11 MIL/uL   Hemoglobin 13.0 12.0 - 15.0 g/dL   HCT 38.3 36.0 - 46.0 %   MCV 80.3 80.0 - 100.0 fL   MCH 27.3 26.0 - 34.0 pg   MCHC 33.9 30.0 - 36.0 g/dL   RDW 13.7 11.5 - 15.5 %   Platelets 350 150 - 400 K/uL   nRBC 0.0 0.0 - 0.2 %    Comment: Performed at [facility], [address] [place], [place] [address]  Comprehensive metabolic panel     Status: Abnormal   Collection Time: 03/07/19  3:07 PM  Result Value  Ref Range   Sodium 138 135 - 145 mmol/L   Potassium 3.4 (L) 3.5 - 5.1 mmol/L   Chloride 99 98 - 111 mmol/L   CO2 26 22 - 32 mmol/L   Glucose, Bld 263 (H) 70 - 99 mg/dL   BUN 13 8 - 23 mg/dL   Creatinine, Ser 0.56 0.44 - 1.00 mg/dL   Calcium 9.5 8.9 - 10.3 mg/dL   Total Protein 7.7 6.5 - 8.1 g/dL   Albumin 4.4 3.5 - 5.0 g/dL   AST 19 15 - 41 U/L   ALT 18 0 - 44 U/L   Alkaline Phosphatase 71 38 - 126 U/L   Total Bilirubin 0.4 0.3 - 1.2 mg/dL   GFR calc non Af Amer >60 >60 mL/min   GFR calc Af Amer >60 >60 mL/min   Anion gap 13 5 - 15    Comment: Performed at [facility], [address] [place], [place] [address]  Protime-INR     Status: None   Collection Time: 03/07/19  3:07 PM  Result Value Ref Range   Prothrombin Time 12.3 11.4 - 15.2 seconds   INR 0.9 0.8 - 1.2    Comment: (NOTE) INR goal varies based on device and disease states. Performed at [facility], [address] [place], [place] [address]   Type and screen Order type and screen if day of surgery is less than 15 days from draw of preadmission visit or order morning of surgery if day of surgery is greater than 6 days from preadmission visit.     Status: None   Collection Time: 03/07/19  3:07 PM  Result Value  Ref Range   ABO/RH(D) A POS    Antibody Screen NEG    Sample Expiration 03/21/2019,2359    Extend sample reason      NO TRANSFUSIONS OR PREGNANCY IN THE PAST 3 MONTHS Performed at [facility], [address] [place], [place] [address]   Urinalysis, Routine w reflex microscopic     Status: Abnormal   Collection Time: 03/07/19  3:07 PM  Result Value Ref Range   Color, Urine YELLOW (A) YELLOW   APPearance CLEAR (A) CLEAR   Specific Gravity, Urine 1.020 1.005 - 1.030   pH 5.0 5.0 - 8.0   Glucose, UA >=500 (A) NEGATIVE mg/dL   Hgb urine dipstick NEGATIVE NEGATIVE   Bilirubin Urine NEGATIVE NEGATIVE   Ketones, ur NEGATIVE NEGATIVE mg/dL   Protein, ur NEGATIVE NEGATIVE mg/dL    Nitrite NEGATIVE NEGATIVE   Leukocytes,Ua NEGATIVE NEGATIVE   RBC / HPF 0-5 0 - 5 RBC/hpf   WBC, UA 0-5 0 - 5 WBC/hpf   Bacteria, UA NONE SEEN NONE SEEN   Squamous Epithelial / LPF 0-5 0 - 5   Mucus PRESENT     Comment: Performed at [facility], [address], [place], [place] [address]  Urine culture     Status: Abnormal   Collection Time: 03/07/19  3:07 PM   Specimen: Urine, Clean Catch  Result Value Ref Range   Specimen Description      URINE, CLEAN CATCH Performed at [facility], [address], [place], [place] [address]    Special Requests      Normal Performed at [facility], [address] [place], [place] [address]    Culture (A)     <10,000 COLONIES/mL INSIGNIFICANT GROWTH Performed at [facility] [address], [place], [place] [address]    Report Status 03/08/2019 FINAL   Hemoglobin A1c     Status: Abnormal   Collection Time: 03/07/19  3:07 PM  Result Value Ref Range   Hgb A1c MFr Bld 6.8 (H) 4.8 - 5.6 %    Comment: (NOTE) Pre diabetes:          5.7%-6.4% Diabetes:              >6.4% Glycemic control for   <7.0% adults with diabetes    Mean Plasma Glucose 148.46 mg/dL    Comment: Performed at [facility] [address], [place], [place] [address]  Sedimentation rate     Status: None   Collection Time: 03/07/19  3:07 PM  Result Value Ref Range   Sed Rate 13 0 - 30 mm/hr    Comment: Performed at [facility], [address], [place], [place] [address]  Surgical pcr screen     Status: None   Collection Time: 03/07/19  3:07 PM   Specimen: Nasal Mucosa; Nasal Swab  Result Value Ref Range   MRSA, PCR NEGATIVE NEGATIVE   Staphylococcus aureus NEGATIVE NEGATIVE    Comment: (NOTE) The Xpert SA Assay (FDA approved for NASAL specimens in patients 38 years of age and older), is one component of a comprehensive surveillance program. It is not intended to diagnose infection nor to guide or monitor  treatment. Performed at [facility], [address] [place],  [address]   C-reactive protein     Status: Abnormal   Collection Time: 03/07/19  3:16 PM  Result Value Ref Range   CRP 2.1 (H) <1.0 mg/dL    Comment: Performed at  [facility], [place] [address], [place], [place] [address]  SARS CORONAVIRUS 2 (TAT 6-24 HRS) Nasopharyngeal Nasopharyngeal Swab     Status: None   Collection Time: 03/13/19  1:23 PM   Specimen: Nasopharyngeal Swab  Result Value Ref Range   SARS Coronavirus 2 NEGATIVE NEGATIVE    Comment: (NOTE) SARS-CoV-2 target nucleic acids are NOT DETECTED. The SARS-CoV-2 RNA is generally detectable in upper and lower respiratory specimens during the acute phase of infection. Negative results do not preclude SARS-CoV-2 infection, do not rule out co-infections with other pathogens, and should not be used as the sole basis for treatment or other patient management decisions. Negative results must be combined with clinical observations, patient history, and epidemiological information. The expected result is Negative. Fact Sheet for Patients: SugarRoll.be Fact Sheet for Healthcare Providers: https://www.woods-mathews.com/ This test is not yet approved or cleared by the [place] FDA and  has been authorized for detection and/or diagnosis of SARS-CoV-2 by FDA under an Emergency Use Authorization (EUA). This EUA will remain  in effect (meaning this test can be used) for the duration of the COVID-19 declaration under Section 56 4(b)(1) of the Act, 21 U.S.C. section 360bbb-3(b)(1), unless the authorization is terminated or revoked sooner. Performed at [facility], [place] [address], [place], [place] [address]   Glucose, capillary     Status: Abnormal   Collection Time: 03/17/19  7:35 AM  Result Value Ref Range   Glucose-Capillary 219 (H) 70 - 99 mg/dL  SARS CORONAVIRUS 2 (TAT 6-24 HRS) Nasopharyngeal  Nasopharyngeal Swab     Status: None   Collection Time: 03/20/19  9:52 AM   Specimen: Nasopharyngeal Swab  Result Value Ref Range   SARS Coronavirus 2 NEGATIVE NEGATIVE    Comment: (NOTE) SARS-CoV-2 target nucleic acids are NOT DETECTED. The SARS-CoV-2 RNA is generally detectable in upper and lower respiratory specimens during the acute phase of infection. Negative results do not preclude SARS-CoV-2 infection, do not rule out co-infections with other pathogens, and should not be used as the sole basis for treatment or other patient management decisions. Negative results must be combined with clinical observations, patient history, and epidemiological information. The expected result is Negative. Fact Sheet for Patients: SugarRoll.be Fact Sheet for Healthcare Providers: https://www.woods-mathews.com/ This test is not yet approved or cleared by the [place] FDA and  has been authorized for detection and/or diagnosis of SARS-CoV-2 by FDA under an Emergency Use Authorization (EUA). This EUA will remain  in effect (meaning this test can be used) for the duration of the COVID-19 declaration under Section 56 4(b)(1) of the Act, 21 U.S.C. section 360bbb-3(b)(1), unless the authorization is terminated or revoked sooner. Performed at [facility], [place] [address] [place], [place] [address]   Glucose, capillary     Status: Abnormal   Collection Time: 03/24/19  6:18 AM  Result Value Ref Range   Glucose-Capillary 228 (H) 70 - 99 mg/dL  Type and screen [place]     Status: None   Collection Time: 03/24/19  6:27 AM  Result Value Ref Range   ABO/RH(D) A POS    Antibody Screen NEG    Sample Expiration      03/27/2019,2359 Performed at [facility], [address] [place], [place] [address]   Glucose, capillary     Status: Abnormal   Collection Time: 03/24/19 11:37 AM  Result Value Ref Range    Glucose-Capillary 251 (H) 70 - 99 mg/dL  Glucose, capillary     Status: Abnormal   Collection Time: 03/24/19 12:35 PM  Result Value Ref Range   Glucose-Capillary 250 (H) 70 - 99 mg/dL  Glucose, capillary     Status: Abnormal   Collection Time: 03/24/19  5:05 PM  Result Value Ref Range   Glucose-Capillary 255 (H) 70 - 99 mg/dL  Glucose, capillary     Status: Abnormal   Collection Time: 03/24/19  9:18 PM  Result Value Ref Range   Glucose-Capillary 258 (H) 70 - 99 mg/dL   Comment 1 Notify RN   Glucose, capillary     Status: Abnormal   Collection Time: 03/25/19  7:49 AM  Result Value Ref Range   Glucose-Capillary 177 (H) 70 - 99 mg/dL   Comment 1 Notify RN   Glucose, capillary     Status: Abnormal   Collection Time: 03/25/19 11:32 AM  Result Value Ref Range   Glucose-Capillary 256 (H) 70 - 99 mg/dL   Comment 1 Notify RN   Glucose, capillary     Status: Abnormal   Collection Time: 03/25/19  4:28 PM  Result Value Ref Range   Glucose-Capillary 179 (H) 70 - 99 mg/dL   Comment 1 Notify RN   Glucose, capillary     Status: Abnormal   Collection Time: 03/25/19  9:02 PM  Result Value Ref Range   Glucose-Capillary 167 (H) 70 - 99 mg/dL  Glucose, capillary     Status: Abnormal   Collection Time: 03/26/19  7:34 AM  Result Value Ref Range   Glucose-Capillary 213 (H) 70 - 99 mg/dL   Comment 1 Notify RN   Glucose, capillary     Status: Abnormal   Collection Time: 03/26/19 11:40 AM  Result Value Ref Range   Glucose-Capillary 253 (H) 70 - 99 mg/dL   Comment 1 Notify RN     Radiology No results found.  Assessment/Plan  Essential hypertension blood pressure control important in reducing the progression of atherosclerotic disease. On appropriate oral medications.   Pulmonary embolism ([facility]) Previous.  This prompted her IVC filter placement preliminary to her knee replacement 2 months ago.  No recurrent PE symptoms.  Diabetes ([place]) blood glucose control important in reducing the  progression of atherosclerotic disease. Also, involved in wound healing. On appropriate medications.   S/P IVC filter The patient has largely recovered from her knee replacement and does not have a DVT.  Her filter can now be removed safely.  Risks and benefits were discussed with the patient and she is agreeable to proceed with IVC filter removal.    Leotis Pain, MD  05/13/2019 10:05 AM    This note was created with Dragon medical transcription system.  Any errors from dictation are purely unintentional

## 2019-05-13 NOTE — Assessment & Plan Note (Signed)
Previous.  This prompted her IVC filter placement preliminary to her knee replacement 2 months ago.  No recurrent PE symptoms.

## 2019-05-13 NOTE — Patient Instructions (Signed)
Inferior Vena Cava Filter Removal  Inferior vena cava filter removal is a procedure to take out a metal filter that was placed into a large vein in the abdomen (inferior vena cava, IVC). An IVC filter prevents blood clots in the legs or pelvis from traveling to the heart or lungs. Some IVC filters are designed to be removed (retrievable filters). You may have your filter removed when the danger of forming blood clots has passed or when you can take blood-thinning medicine to prevent blood clots. In some cases, the filter may need to be removed because it becomes damaged, is not working, or is causing problems. Most filters can be removed through the vein (percutaneous). In the rare cases when a surgeon is unable to remove the filter percutaneously, one of these steps may be taken:  A more invasive, open surgery may be necessary.  The filter may be left in place. Tell a health care provider about:  Any allergies you have.  All medicines you are taking, including vitamins, herbs, eye drops, creams, and over-the-counter medicines.  Any problems you or family members have had with anesthetic medicines or with contrast dyes that are used during an imaging test.  Any blood disorders you have.  Any surgeries you have had.  Any medical conditions you have.  Whether you are pregnant or may be pregnant. What are the risks? Generally, this is a safe procedure. However, problems may occur, including:  Infection.  Bleeding.  Allergic reactions to medicines or dyes.  Damage to the IVC, other blood vessels, or surrounding structures.  A blood clot or a piece of the filter breaking loose and traveling to the heart or lungs. What happens before the procedure? Medicines Ask your health care provider about:  Changing or stopping your regular medicines. This is especially important if you are taking diabetes medicines or blood thinners.  Taking medicines such as aspirin and ibuprofen. These  medicines can thin your blood. Do not take these medicines unless your health care provider tells you to take them.  Taking over-the-counter medicines, vitamins, herbs, and supplements. Staying hydrated Follow instructions from your health care provider about hydration, which may include:  Up to 2 hours before the procedure - you may continue to drink clear liquids, such as water or clear fruit juice. Eating and drinking restrictions Follow instructions from your health care provider about eating and drinking, which may include:  8 hours before the procedure - stop eating heavy meals or foods such as meat, fried foods, or fatty foods.  6 hours before the procedure - stop eating light meals or foods, such as toast or cereal.  6 hours before the procedure - stop drinking milk or drinks that contain milk.  2 hours before the procedure - stop drinking clear liquids. General instructions  Plan to have someone take you home from the hospital or clinic.  Plan to have a responsible adult care for you for at least 24 hours after you leave the hospital or clinic. This is important. What happens during the procedure?  To lower your risk of infection: ? Your health care team will wash or sanitize their hands. ? Hair may be removed from the surgical area. ? Your skin will be washed with soap.  An IV will be inserted into one of your veins.  You will be given one or more of the following: ? A medicine to help you relax (sedative). ? A medicine to make you fall asleep (general anesthetic).  The   procedure will be done through a vein in your groin or neck that leads to the IVC. Your health care provider will inject a numbing medicine (local anesthetic) into the skin over the vein that will be used.  A small incision will be made over the vein.  A long, thin tube (catheter) will be inserted into the vein.  The catheter will be moved through your vein and into your IVC. X-rays may be done to  help guide the catheter into place. Dye may be injected through the catheter before the X-rays to make the catheter and filter easier to see.  When the catheter reaches the filter, a hook (snare) on the end of the catheter may be used to latch onto the filter. In some cases, a grasping instrument (forceps) may be threaded through the catheter to gently grab and remove the filter instead.  After the filter has been hooked or grasped, the filter and instruments will be pulled out through the catheter.  The catheter will be removed through the incision in your skin.  Pressure will be placed over your incision until bleeding stops.  A bandage (dressing) will be placed over your incision. The procedure may vary among health care providers and hospitals. What happens after the procedure?  Your blood pressure, heart rate, breathing rate, and blood oxygen level will be monitored until the medicines you were given have worn off.  Do not drive for 24 hours if you were given a sedative during your procedure.  You may need to stay in bed (be on bed rest) for a period of time. Summary  Inferior vena cava (IVC) filter removal is a procedure to take out a filter that was placed to prevent blood clots from traveling to your heart or lungs.  You may have your filter removed when the danger of forming blood clots has passed or when you can take blood-thinning medicines to prevent blood clots. In some cases, a filter is removed because there is a problem with it.  The removal procedure is similar to the procedure that was used to insert the filter. A long, thin tube (catheter) will be inserted through a vein in your groin or neck. Then, the filter will be gently grasped and pulled out through the catheter.  Plan to have a responsible adult care for you for at least 24 hours after you leave the hospital or clinic. This is important. This information is not intended to replace advice given to you by your  health care provider. Make sure you discuss any questions you have with your health care provider. Document Released: 01/02/2017 Document Revised: 06/01/2017 Document Reviewed: 01/02/2017 Elsevier Patient Education  2020 [employer]  

## 2019-05-13 NOTE — Assessment & Plan Note (Signed)
blood pressure control important in reducing the progression of atherosclerotic disease. On appropriate oral medications.  

## 2019-05-13 NOTE — Assessment & Plan Note (Signed)
blood glucose control important in reducing the progression of atherosclerotic disease. Also, involved in wound healing. On appropriate medications.  

## 2019-05-19 ENCOUNTER — Other Ambulatory Visit
Admission: RE | Admit: 2019-05-19 | Discharge: 2019-05-19 | Disposition: A | Discharge status: Home / Self Care | Payer: BC Managed Care – PPO | Source: Ambulatory Visit | Attending: Vascular Surgery | Admitting: Vascular Surgery

## 2019-05-19 DIAGNOSIS — Z01812 Encounter for preprocedural laboratory examination: Secondary | ICD-10-CM | POA: Diagnosis not present

## 2019-05-19 DIAGNOSIS — Z20828 Contact with and (suspected) exposure to other viral communicable diseases: Secondary | ICD-10-CM | POA: Insufficient documentation

## 2019-05-20 ENCOUNTER — Telehealth (INDEPENDENT_AMBULATORY_CARE_PROVIDER_SITE_OTHER): Payer: Self-pay

## 2019-05-20 LAB — SARS CORONAVIRUS 2 (TAT 6-24 HRS): SARS Coronavirus 2: NEGATIVE

## 2019-05-20 NOTE — Telephone Encounter (Signed)
Patient was seen on 05/13/2019 and was scheduled for angio with Dr. Lucky Cowboy on 05/22/2019 with a 6:45 am arrival time to the [facility]. Patient will do her Covid testing on 05/15/2019 between 12:30-2:30 pm at the [facility]>

## 2019-05-21 ENCOUNTER — Encounter (INDEPENDENT_AMBULATORY_CARE_PROVIDER_SITE_OTHER): Payer: Self-pay

## 2019-05-21 NOTE — Telephone Encounter (Signed)
Due to unforeseen circumstances the patient has been rescheduled to 06/05/2019 with Dr. Lucky Cowboy with a 9:45 am arrival time to the [facility]. Patient will do Covid testing on 06/02/2019 between 12:30-2:30 pm at the [facility]. New dates and times will be mailed to the patient.

## 2019-06-02 ENCOUNTER — Other Ambulatory Visit
Admission: RE | Admit: 2019-06-02 | Discharge: 2019-06-02 | Disposition: A | Discharge status: Home / Self Care | Payer: BC Managed Care – PPO | Source: Ambulatory Visit | Attending: Vascular Surgery | Admitting: Vascular Surgery

## 2019-06-02 ENCOUNTER — Other Ambulatory Visit: Payer: Self-pay

## 2019-06-02 DIAGNOSIS — Z20828 Contact with and (suspected) exposure to other viral communicable diseases: Secondary | ICD-10-CM | POA: Insufficient documentation

## 2019-06-02 DIAGNOSIS — Z01812 Encounter for preprocedural laboratory examination: Secondary | ICD-10-CM | POA: Diagnosis not present

## 2019-06-03 LAB — SARS CORONAVIRUS 2 (TAT 6-24 HRS): SARS Coronavirus 2: NEGATIVE

## 2019-06-05 ENCOUNTER — Ambulatory Visit
Admission: RE | Admit: 2019-06-05 | Discharge: 2019-06-05 | Disposition: A | Discharge status: Home / Self Care | Payer: BC Managed Care – PPO | Attending: Vascular Surgery | Admitting: Vascular Surgery

## 2019-06-05 ENCOUNTER — Other Ambulatory Visit (INDEPENDENT_AMBULATORY_CARE_PROVIDER_SITE_OTHER): Payer: Self-pay | Admitting: Nurse Practitioner

## 2019-06-05 ENCOUNTER — Encounter
Admission: RE | Disposition: A | Discharge status: Home / Self Care | Payer: Self-pay | Source: Home / Self Care | Attending: Vascular Surgery

## 2019-06-05 ENCOUNTER — Other Ambulatory Visit: Payer: Self-pay

## 2019-06-05 DIAGNOSIS — Z86718 Personal history of other venous thrombosis and embolism: Secondary | ICD-10-CM | POA: Insufficient documentation

## 2019-06-05 DIAGNOSIS — E1169 Type 2 diabetes mellitus with other specified complication: Secondary | ICD-10-CM | POA: Diagnosis not present

## 2019-06-05 DIAGNOSIS — E890 Postprocedural hypothyroidism: Secondary | ICD-10-CM | POA: Diagnosis not present

## 2019-06-05 DIAGNOSIS — E669 Obesity, unspecified: Secondary | ICD-10-CM | POA: Insufficient documentation

## 2019-06-05 DIAGNOSIS — Z4589 Encounter for adjustment and management of other implanted devices: Secondary | ICD-10-CM | POA: Insufficient documentation

## 2019-06-05 DIAGNOSIS — I10 Essential (primary) hypertension: Secondary | ICD-10-CM | POA: Diagnosis not present

## 2019-06-05 DIAGNOSIS — Z7984 Long term (current) use of oral hypoglycemic drugs: Secondary | ICD-10-CM | POA: Diagnosis not present

## 2019-06-05 DIAGNOSIS — Z79899 Other long term (current) drug therapy: Secondary | ICD-10-CM | POA: Diagnosis not present

## 2019-06-05 DIAGNOSIS — Z96652 Presence of left artificial knee joint: Secondary | ICD-10-CM | POA: Diagnosis not present

## 2019-06-05 DIAGNOSIS — Z833 Family history of diabetes mellitus: Secondary | ICD-10-CM | POA: Diagnosis not present

## 2019-06-05 DIAGNOSIS — E785 Hyperlipidemia, unspecified: Secondary | ICD-10-CM | POA: Diagnosis not present

## 2019-06-05 DIAGNOSIS — Z86711 Personal history of pulmonary embolism: Secondary | ICD-10-CM | POA: Diagnosis not present

## 2019-06-05 DIAGNOSIS — Z79890 Hormone replacement therapy (postmenopausal): Secondary | ICD-10-CM | POA: Insufficient documentation

## 2019-06-05 DIAGNOSIS — M170 Bilateral primary osteoarthritis of knee: Secondary | ICD-10-CM | POA: Insufficient documentation

## 2019-06-05 DIAGNOSIS — Z6827 Body mass index (BMI) 27.0-27.9, adult: Secondary | ICD-10-CM | POA: Diagnosis not present

## 2019-06-05 DIAGNOSIS — K219 Gastro-esophageal reflux disease without esophagitis: Secondary | ICD-10-CM | POA: Insufficient documentation

## 2019-06-05 DIAGNOSIS — M25562 Pain in left knee: Secondary | ICD-10-CM

## 2019-06-05 DIAGNOSIS — I82409 Acute embolism and thrombosis of unspecified deep veins of unspecified lower extremity: Secondary | ICD-10-CM

## 2019-06-05 HISTORY — PX: IVC FILTER REMOVAL: CATH118246

## 2019-06-05 LAB — GLUCOSE, CAPILLARY
Glucose-Capillary: 159 mg/dL — ABNORMAL HIGH (ref 70–99)
Glucose-Capillary: 164 mg/dL — ABNORMAL HIGH (ref 70–99)

## 2019-06-05 SURGERY — IVC FILTER REMOVAL
Anesthesia: Moderate Sedation

## 2019-06-05 MED ORDER — FENTANYL CITRATE (PF) 100 MCG/2ML IJ SOLN
INTRAMUSCULAR | Status: DC | PRN
  Administered 2019-06-05: 50 ug via INTRAVENOUS

## 2019-06-05 MED ORDER — CEFAZOLIN SODIUM-DEXTROSE 2-4 GM/100ML-% IV SOLN
2.0000 g | Freq: Once | INTRAVENOUS | Status: AC
  Administered 2019-06-05: 2 g via INTRAVENOUS

## 2019-06-05 MED ORDER — DIPHENHYDRAMINE HCL 50 MG/ML IJ SOLN: 50.0000 mg | Freq: Once | INTRAMUSCULAR | Status: DC | PRN

## 2019-06-05 MED ORDER — HYDROMORPHONE HCL 1 MG/ML IJ SOLN: 1.0000 mg | Freq: Once | INTRAMUSCULAR | Status: DC | PRN

## 2019-06-05 MED ORDER — MIDAZOLAM HCL 2 MG/ML PO SYRP: 8.0000 mg | ORAL_SOLUTION | Freq: Once | ORAL | Status: DC | PRN

## 2019-06-05 MED ORDER — SODIUM CHLORIDE 0.9 % IV SOLN
INTRAVENOUS | Status: DC
  Administered 2019-06-05: 10:00:00 via INTRAVENOUS

## 2019-06-05 MED ORDER — METHYLPREDNISOLONE SODIUM SUCC 125 MG IJ SOLR: 125.0000 mg | Freq: Once | INTRAMUSCULAR | Status: DC | PRN

## 2019-06-05 MED ORDER — FAMOTIDINE 20 MG PO TABS: 40.0000 mg | ORAL_TABLET | Freq: Once | ORAL | Status: DC | PRN

## 2019-06-05 MED ORDER — MIDAZOLAM HCL 2 MG/2ML IJ SOLN
INTRAMUSCULAR | Status: DC | PRN
  Administered 2019-06-05: 2 mg via INTRAVENOUS

## 2019-06-05 MED ORDER — FENTANYL CITRATE (PF) 100 MCG/2ML IJ SOLN
INTRAMUSCULAR | Status: AC
  Filled 2019-06-05: qty 2

## 2019-06-05 MED ORDER — ONDANSETRON HCL 4 MG/2ML IJ SOLN: 4.0000 mg | Freq: Four times a day (QID) | INTRAMUSCULAR | Status: DC | PRN

## 2019-06-05 MED ORDER — IODIXANOL 320 MG/ML IV SOLN
INTRAVENOUS | Status: DC | PRN
  Administered 2019-06-05: 15 mL via INTRAVENOUS

## 2019-06-05 MED ORDER — MIDAZOLAM HCL 5 MG/5ML IJ SOLN
INTRAMUSCULAR | Status: AC
  Filled 2019-06-05: qty 5

## 2019-06-05 SURGICAL SUPPLY — 5 items
COVER PROBE U/S 5X48 (MISCELLANEOUS) ×2 IMPLANT
PACK ANGIOGRAPHY (CUSTOM PROCEDURE TRAY) ×2 IMPLANT
SET VENACAVA FILTER RETRIEVAL (MISCELLANEOUS) ×2 IMPLANT
TOWEL OR 17X26 4PK STRL BLUE (TOWEL DISPOSABLE) ×2 IMPLANT
WIRE J 3MM .035X145CM (WIRE) ×2 IMPLANT

## 2019-06-05 NOTE — Discharge Instructions (Signed)
Inferior Vena Cava Filter Removal, Care After This sheet gives you information about how to care for yourself after your procedure. Your health care provider may also give you more specific instructions. If you have problems or questions, contact your health care provider. What can I expect after the procedure? After the procedure, it is common to have:  Mild pain and bruising around your incision in your neck or groin.  Fatigue. Follow these instructions at home: Incision care   Follow instructions from your health care provider about how to take care of your incision. Make sure you: ? Wash your hands with soap and water before you change your bandage (dressing). If soap and water are not available, use hand sanitizer. ? Change your dressing as told by your health care provider.  Check your incision area every day for signs of infection. Check for: ? Redness, swelling, or more pain. ? Fluid or blood. ? Warmth. ? Pus or a bad smell. General instructions  Take over-the-counter and prescription medicines only as told by your health care provider.  Do not take baths, swim, or use a hot tub until your health care provider approves. Ask your health care provider if you may take showers. You may only be allowed to take sponge baths.  Do not drive for 24 hours if you were given a medicine to help you relax (sedative) during your procedure.  Return to your normal activities as told by your health care provider. Ask your health care provider what activities are safe for you.  Keep all follow-up visits as told by your health care provider. This is important. Contact a health care provider if:  You have chills or a fever.  You have redness, swelling, or more pain around your incision.  Your incision feels warm to the touch.  You have pus or a bad smell coming from your incision. Get help right away if:  You have blood coming from your incision (active bleeding). ? If you have  bleeding from the incision site, lie down, apply pressure to the area with a clean cloth or gauze, and get help right away.  You have chest pain.  You have difficulty breathing. Summary  Follow instructions from your health care provider about how to take care of your incision.  Return to your normal activities as told by your health care provider.  Check your incision area every day for signs of infection.  Get help right away if you have active bleeding, chest pain, or trouble breathing. This information is not intended to replace advice given to you by your health care provider. Make sure you discuss any questions you have with your health care provider. Document Released: 12/28/2016 Document Revised: 06/01/2017 Document Reviewed: 12/28/2016 Elsevier Patient Education  [place].    Moderate Conscious Sedation, Adult, Care After These instructions provide you with information about caring for yourself after your procedure. Your health care provider may also give you more specific instructions. Your treatment has been planned according to current medical practices, but problems sometimes occur. Call your health care provider if you have any problems or questions after your procedure. What can I expect after the procedure? After your procedure, it is common:  To feel sleepy for several hours.  To feel clumsy and have poor balance for several hours.  To have poor judgment for several hours.  To vomit if you eat too soon. Follow these instructions at home: For at least 24 hours after the procedure:  Do not: ? Participate in activities where you could fall or become injured. ? Drive. ? Use heavy machinery. ? Drink alcohol. ? Take sleeping pills or medicines that cause drowsiness. ? Make important decisions or sign legal documents. ? Take care of children on your own.  Rest. Eating and drinking  Follow the diet recommended by your health care provider.  If you  vomit: ? Drink water, juice, or soup when you can drink without vomiting. ? Make sure you have little or no nausea before eating solid foods. General instructions  Have a responsible adult stay with you until you are awake and alert.  Take over-the-counter and prescription medicines only as told by your health care provider.  If you smoke, do not smoke without supervision.  Keep all follow-up visits as told by your health care provider. This is important. Contact a health care provider if:  You keep feeling nauseous or you keep vomiting.  You feel light-headed.  You develop a rash.  You have a fever. Get help right away if:  You have trouble breathing. This information is not intended to replace advice given to you by your health care provider. Make sure you discuss any questions you have with your health care provider. Document Released: 04/09/2013 Document Revised: 06/01/2017 Document Reviewed: 10/09/2015 Elsevier Patient Education  2020 [employer].

## 2019-06-05 NOTE — Op Note (Signed)
[facility]   OPERATIVE NOTE    PRE-OPERATIVE DIAGNOSIS:  1. DVT 2. 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 Ultrasound guidance for vascular access right jugular vein 2. Catheter placement into inferior vena cava from right jugular vein 3. Inferior venacavogram 4. Retrieval of [place] IVC filter  SURGEON: Leotis Pain, MD  ASSISTANT(S): None  ANESTHESIA: Local with moderate conscious sedation for approximately 15 minutes using 2 mg of Versed and 50 mcg of Fentanyl  ESTIMATED BLOOD LOSS: 3 cc  CONTRAST:  15 cc  FLUORO TIME:  0.5 minutes  FINDING(S): 1. patent IVC  SPECIMEN(S): IVC filter  INDICATIONS:  Patient is a 63 y.o. female who presents with a previous history of IVC filter placement. 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 The patient remains on anticoagulation. Risks and benefits were discussed, and informed consent was obtained.  DESCRIPTION: After obtaining full informed written consent, the patient was brought back to the vascular suite and placed supine upon the table.Moderate conscious sedation was administered during a face to face encounter with the patient throughout the procedure with my supervision of the RN administering medicines and monitoring the patient's vital signs, pulse oximetry, telemetry and mental status throughout from the start of the procedure until the patient was taken to the recovery room.  After obtaining adequate anesthesia, the patient was prepped and draped in the standard fashion. The right jugular vein was visualized with ultrasound and found to be widely patent. It was then accessed under direct ultrasound guidance without difficulty with the Seldinger needle and a permanent image was recorded. A J-wire was placed. After skin nick  and dilatation, the retrieval sheath was placed over the wire and advanced into the inferior vena cava. Inferior vena cava was imaged and found to be widely patent on inferior venacavogram. The filter was straight in its orientation. The retrieval snare was then placed through the sheath and the hook of the filter was snared without difficulty. The sheath was then advanced, and the filter was collapsed and brought into the sheath in its entirety. It was then removed from the body in its entirety. The retrieval sheath was then removed. Pressure was held at the access site and sterile dressing was placed. The patient was taken to the recovery room in stable condition having tolerated the procedure well.  COMPLICATIONS: None  CONDITION: Stable   Leotis Pain 06/05/2019 12:18 PM  This note was created with Dragon Medical transcription system. Any errors in dictation are purely unintentional.

## 2019-06-05 NOTE — H&P (Signed)
[facility] History & Physical Update  The patient was interviewed and re-examined.  The patient's previous History and Physical has been reviewed and is unchanged.  There is no change in the plan of care. We plan to proceed with the scheduled procedure.  Leotis Pain, MD  06/05/2019, 10:12 AM

## 2019-07-10 ENCOUNTER — Encounter (INDEPENDENT_AMBULATORY_CARE_PROVIDER_SITE_OTHER): Payer: Self-pay

## 2019-07-10 ENCOUNTER — Other Ambulatory Visit: Payer: Self-pay

## 2019-07-10 ENCOUNTER — Ambulatory Visit (INDEPENDENT_AMBULATORY_CARE_PROVIDER_SITE_OTHER): Payer: BC Managed Care – PPO | Admitting: Nurse Practitioner

## 2019-07-10 ENCOUNTER — Encounter (INDEPENDENT_AMBULATORY_CARE_PROVIDER_SITE_OTHER): Payer: Self-pay | Admitting: Nurse Practitioner

## 2019-07-10 VITALS — BP 119/81 | HR 74 | Resp 16 | Wt 178.0 lb

## 2019-07-10 DIAGNOSIS — E119 Type 2 diabetes mellitus without complications: Secondary | ICD-10-CM | POA: Diagnosis not present

## 2019-07-10 DIAGNOSIS — M1712 Unilateral primary osteoarthritis, left knee: Secondary | ICD-10-CM

## 2019-07-10 DIAGNOSIS — Z95828 Presence of other vascular implants and grafts: Secondary | ICD-10-CM

## 2019-07-10 IMAGING — MG DIGITAL SCREENING BILATERAL MAMMOGRAM WITH TOMO AND CAD
8 series · 9 of 24 positions shown · non-contrast
Comparison: Previous exam(s).

CLINICAL DATA: Screening.

EXAM:
DIGITAL SCREENING BILATERAL MAMMOGRAM WITH TOMO AND CAD

[R CC synth-2D]
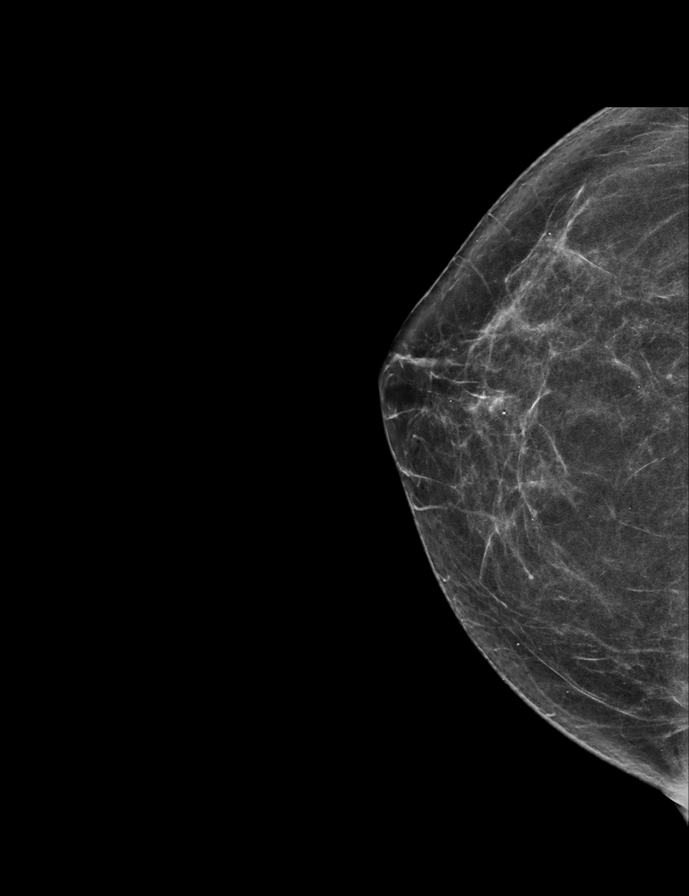

[R MLO synth-2D]
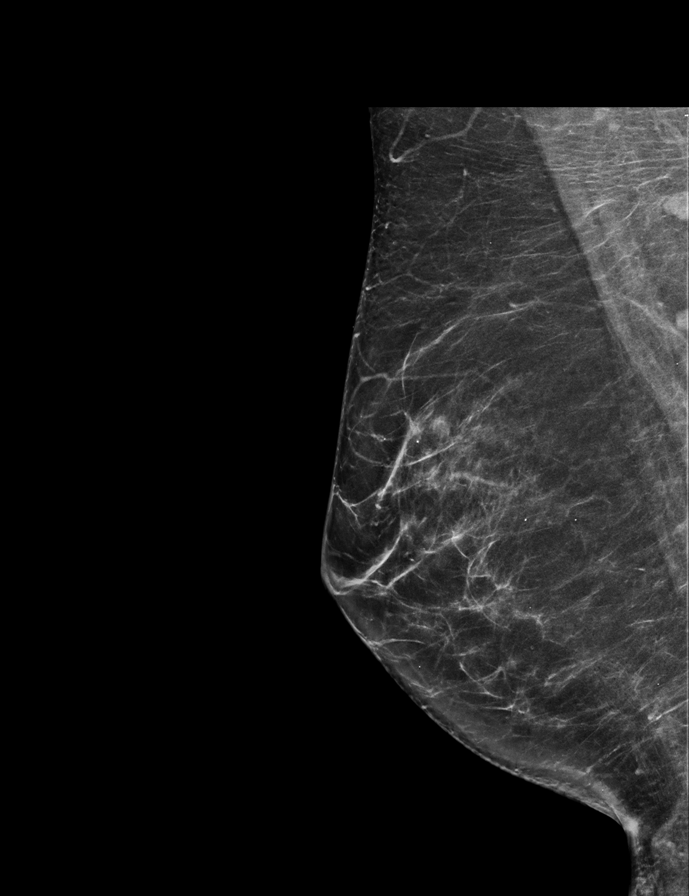

[L CC synth-2D]
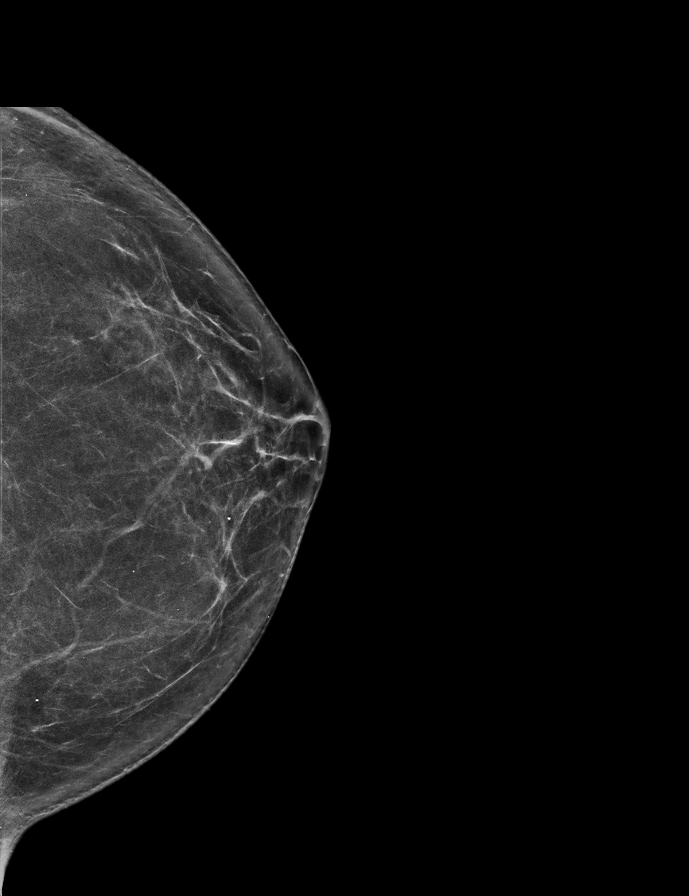

[L MLO synth-2D]
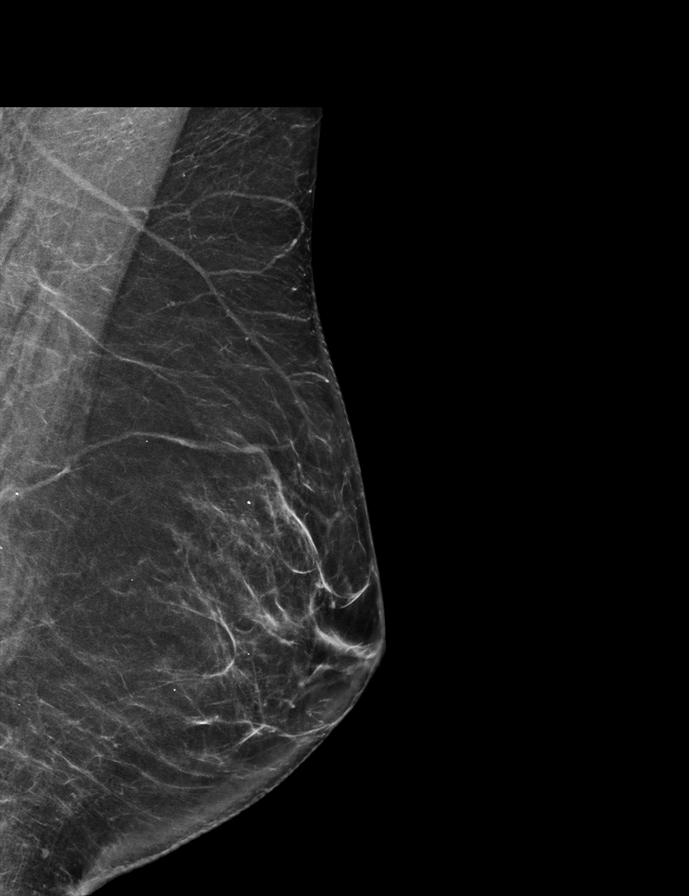

[R CC tomo · 2 of 62 frames shown]
[frame 21/62]
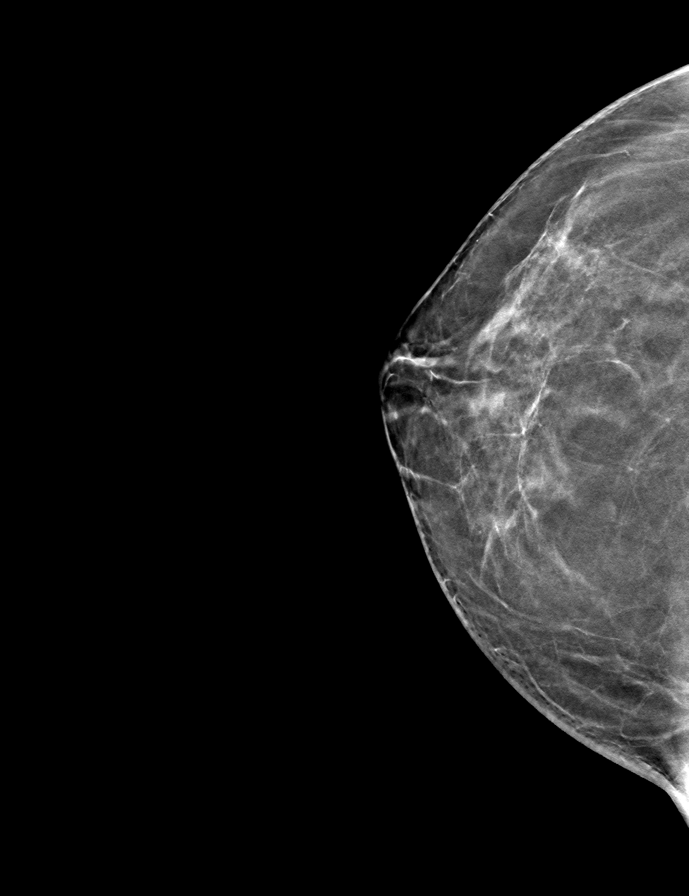
[frame 31/62]
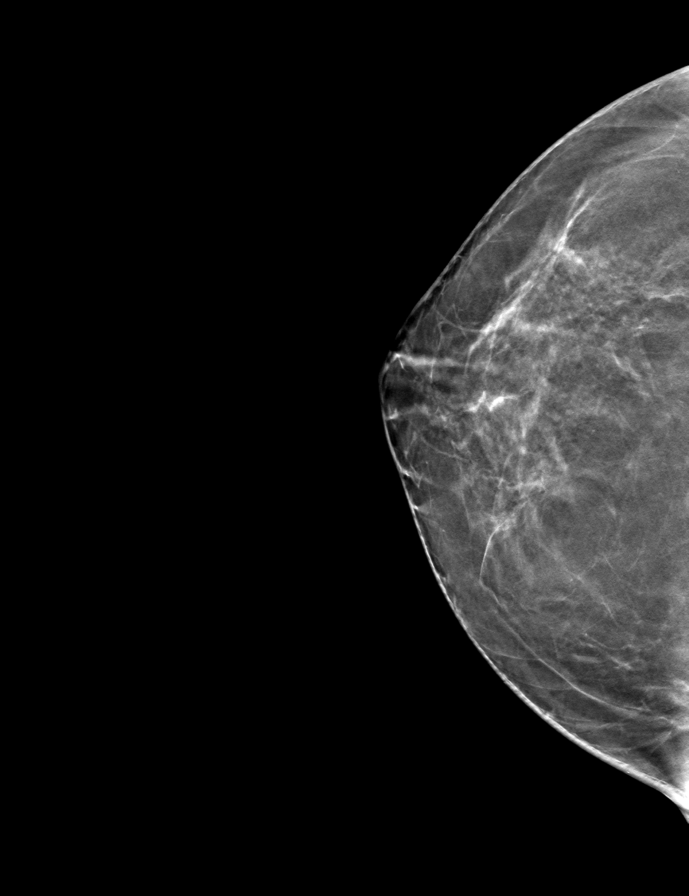

[R MLO tomo · tomo slice 35/68.0]
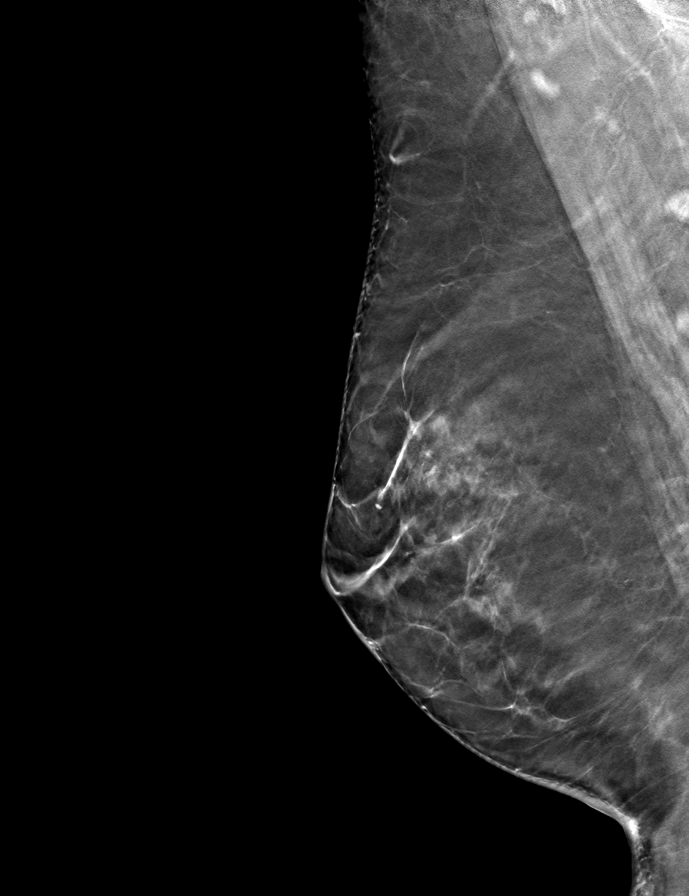

[L CC tomo · tomo slice 34/67.0]
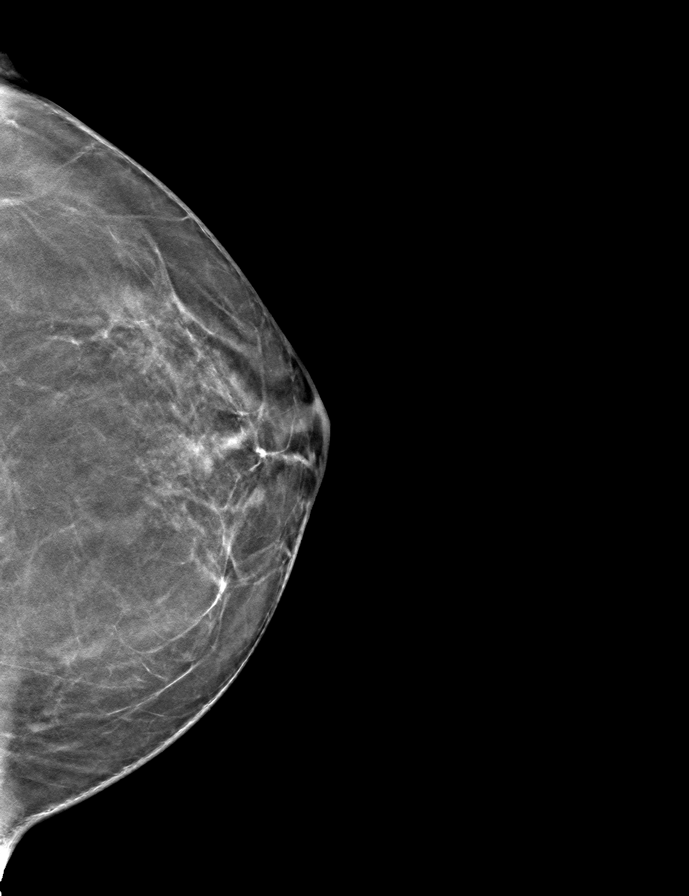

[L MLO tomo · tomo slice 35/68.0]
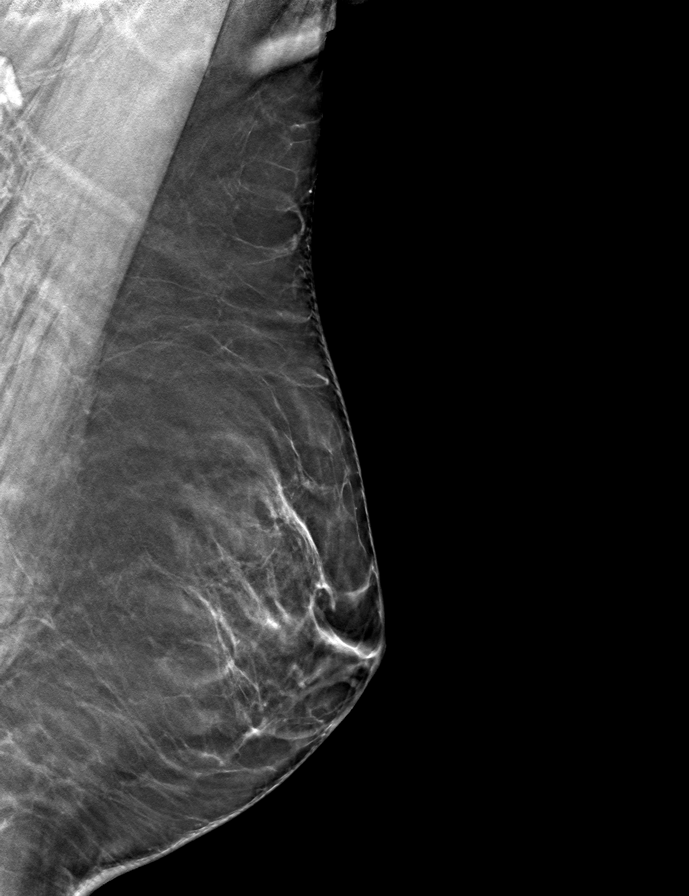

[9 of 24 positions shown; findings below may reference images not displayed]

ACR Breast Density Category b: There are scattered areas of
fibroglandular density.
FINDINGS: There are no findings suspicious for malignancy. Images were
processed with CAD.
IMPRESSION: No mammographic evidence of malignancy. A result letter of this
screening mammogram will be mailed directly to the patient.

RECOMMENDATION:
Screening mammogram in one year. (Code:[id number])

BI-RADS CATEGORY  1: Negative.

## 2019-07-14 ENCOUNTER — Encounter (INDEPENDENT_AMBULATORY_CARE_PROVIDER_SITE_OTHER): Payer: Self-pay | Admitting: Nurse Practitioner

## 2019-07-14 NOTE — Progress Notes (Signed)
SUBJECTIVE:  Patient ID: Alisha Sanchez, female    DOB: 12/09/1955, 64 y.o.   MRN: [id number] Chief Complaint  Patient presents with  . Follow-up    [facility] 91month post IVC removal    HPI  Alisha Sanchez is a 64 y.o. female presents today after IVC filter removal on 06/05/2019.  Since that time the patient denies any fever, chills, nausea, vomiting or diarrhea.  The patient states that the wound is healed up nicely without any evidence of infection.  At this time the patient believes that she is done with any orthopedic interventions.  Overall she states that she is recovering from her numerous procedures.  And feels better at this time.  Past Medical History:  Diagnosis Date  . Arthritis    knees  . Cancer ([place])    Thyroid  . Complication of anesthesia   . Depression   . Diabetes mellitus type 2 in obese ([place])   . Dyspnea   . GERD (gastroesophageal reflux disease)   . Headache    migraines - 3-4x/yr  . HLD (hyperlipidemia)   . HTN (hypertension)   . Hypothyroidism    no thyroid  . Localized swelling of both lower legs    leaky veins  . Lower extremity edema   . Neck pain    left side  . Numbness and tingling    numbness in hands and face tingles  . PONV (postoperative nausea and vomiting)   . Pulmonary embolus ([place])    after thyroid surgery  . Vertigo    no episodes in approx 2 yrs    Past Surgical History:  Procedure Laterality Date  . c-sections     x2   . CATARACT EXTRACTION W/PHACO Left 08/27/2017   Procedure: CATARACT EXTRACTION PHACO AND INTRAOCULAR LENS PLACEMENT ([place]) LEFT DIABETIC;  Surgeon: Leandrew Koyanagi, MD;  Location: [place];  Service: Ophthalmology;  Laterality: Left;  Diabetic - oral meds  . COLONOSCOPY N/A 11/10/2015   Procedure: COLONOSCOPY;  Surgeon: Hulen Luster, MD;  Location: [place];  Service: Gastroenterology;  Laterality: N/A;  . ESOPHAGOGASTRODUODENOSCOPY N/A 11/10/2015   Procedure:  ESOPHAGOGASTRODUODENOSCOPY (EGD) with dialation;  Surgeon: Hulen Luster, MD;  Location: [place];  Service: Gastroenterology;  Laterality: N/A;  . IVC FILTER INSERTION N/A 03/17/2019   Procedure: IVC FILTER INSERTION;  Surgeon: Algernon Huxley, MD;  Location: [place] [facility];  Service: Cardiovascular;  Laterality: N/A;  . IVC FILTER REMOVAL N/A 06/05/2019   Procedure: IVC FILTER REMOVAL;  Surgeon: Algernon Huxley, MD;  Location: [place] [facility];  Service: Cardiovascular;  Laterality: N/A;  . KNEE ARTHROPLASTY Left 03/24/2019   Procedure: COMPUTER ASSISTED TOTAL KNEE ARTHROPLASTY - RNFA;  Surgeon: Dereck Leep, MD;  Location: [facility];  Service: Orthopedics;  Laterality: Left;  . SHOULDER ARTHROSCOPY Left 11/15/2018   Procedure: ARTHROSCOPY SHOULDER WITH DEBRIDEMENT DECOMPRESSION, ROTATOR CUFF REPAIR AND POSSIBLE BICEPS TENODESIS;  Surgeon: Corky Mull, MD;  Location: [place];  Service: Orthopedics;  Laterality: Left;  SM[facility] AND [facility]PHE[facility]  . THYROIDECTOMY      Social History   Socioeconomic History  . Marital status: Married    Spouse name: Not on file  . Number of children: Not on file  . Years of education: Not on file  . Highest education level: Not on file  Occupational History  . Not on file  Tobacco Use  . Smoking status: Never Smoker  . Smokeless  tobacco: Never Used  . Tobacco comment: tobacco use- no  Substance and Sexual Activity  . Alcohol use: Yes    Alcohol/week: 1.0 standard drinks    Types: 1 Glasses of wine per week    Comment: occassional wine  . Drug use: No  . Sexual activity: Not on file  Other Topics Concern  . Not on file  Social History Narrative   Full time. Regularly exercises.    Social Determinants of Health   Financial Resource Strain:   . Difficulty of Paying Living Expenses: Not on file  Food Insecurity:   . Worried About Charity fundraiser in the Last Year: Not on file  . Ran Out of Food in the Last Year:  Not on file  Transportation Needs:   . Lack of Transportation (Medical): Not on file  . Lack of Transportation (Non-Medical): Not on file  Physical Activity:   . Days of Exercise per Week: Not on file  . Minutes of Exercise per Session: Not on file  Stress:   . Feeling of Stress : Not on file  Social Connections:   . Frequency of Communication with Friends and Family: Not on file  . Frequency of Social Gatherings with Friends and Family: Not on file  . Attends Religious Services: Not on file  . Active Member of Clubs or Organizations: Not on file  . Attends Archivist Meetings: Not on file  . Marital Status: Not on file  Intimate Partner Violence:   . Fear of Current or Ex-Partner: Not on file  . Emotionally Abused: Not on file  . Physically Abused: Not on file  . Sexually Abused: Not on file    Family History  Problem Relation Age of Onset  . Liver cancer Father   . Lung cancer Father   . Diabetes Father   . Stroke Other   . Breast cancer Maternal Aunt 32  . Breast cancer Paternal Aunt 89    No Known Allergies   Review of Systems   Review of Systems: Negative Unless Checked Constitutional: [] Weight loss  [] Fever  [] Chills Cardiac: [] Chest pain   []  Atrial Fibrillation  [] Palpitations   [] Shortness of breath when laying flat   [] Shortness of breath with exertion. [] Shortness of breath at rest Vascular:  [] Pain in legs with walking   [] Pain in legs with standing [] Pain in legs when laying flat   [] Claudication    [] Pain in feet when laying flat    [] History of DVT   [] Phlebitis   [] Swelling in legs   [] Varicose veins   [] Non-healing ulcers Pulmonary:   [] Uses home oxygen   [] Productive cough   [] Hemoptysis   [] Wheeze  [] COPD   [] Asthma Neurologic:  [] Dizziness   [] Seizures  [] Blackouts [] History of stroke   [] History of TIA  [] Aphasia   [] Temporary Blindness   [] Weakness or numbness in arm   [] Weakness or numbness in leg Musculoskeletal:   [] Joint swelling    [] Joint pain   [] Low back pain  []  History of Knee Replacement [x] Arthritis [] back Surgeries  []  Spinal Stenosis    Hematologic:  [] Easy bruising  [] Easy bleeding   [] Hypercoagulable state   [] Anemic Gastrointestinal:  [] Diarrhea   [] Vomiting  [x] Gastroesophageal reflux/heartburn   [] Difficulty swallowing. [] Abdominal pain Genitourinary:  [] Chronic kidney disease   [] Difficult urination  [] Anuric   [] Blood in urine [] Frequent urination  [] Burning with urination   [] Hematuria Skin:  [] Rashes   [] Ulcers [] Wounds Psychological:  [] History of anxiety   []   History of major depression  []  Memory Difficulties      OBJECTIVE:   Physical Exam  BP 119/81 (BP Location: Right Arm)   Pulse 74   Resp 16   Wt 178 lb (80.7 kg)   BMI 27.88 kg/m   Gen: WD/WN, NAD Head: [place]/AT, No temporalis wasting.  Ear/Nose/Throat: Hearing grossly intact, nares w/o erythema or drainage Eyes: PER, EOMI, sclera nonicteric.  Neck: Supple, no masses.  No JVD.  Pulmonary:  Good air movement, no use of accessory muscles.  Cardiac: RRR Vascular:  Little evidence of IVC filter removal site.  Completely healed. Vessel Right Left  Radial Palpable Palpable   Gastrointestinal: soft, non-distended. No guarding/no peritoneal signs.  Musculoskeletal: M/S 5/5 throughout.  No deformity or atrophy.  Neurologic: Pain and light touch intact in extremities.  Symmetrical.  Speech is fluent. Motor exam as listed above. Psychiatric: Judgment intact, Mood & affect appropriate for pt's clinical situation.        ASSESSMENT AND PLAN:  1. S/P IVC filter Patient has done well post IVC filter removal.  Patient will continue to follow-up with our office on a as needed basis.  She will contact her office if it is deemed that she needs any further orthopedic surgeries to discuss placement of a subsequent IVC filter.  2. Type 2 diabetes mellitus without complication, without long-term current use of insulin (HCC) Continue hypoglycemic  medications as already ordered, these medications have been reviewed and there are no changes at this time.  Hgb A1C to be monitored as already arranged by primary service   3. Primary osteoarthritis of left knee Continue NSAID medications as already ordered, these medications have been reviewed and there are no changes at this time.  Continued activity and therapy was stressed.    Current Outpatient Medications on File Prior to Visit  Medication Sig Dispense Refill  . alendronate (FOSAMAX) 70 MG tablet Take 70 mg by mouth once a week. Take with a full glass of water on an empty stomach.    Marland Kitchen amLODipine (NORVASC) 5 MG tablet Take 5 mg by mouth at bedtime.     . hydrochlorothiazide (HYDRODIURIL) 25 MG tablet Take 25 mg by mouth daily.    . Melatonin 10 MG TABS Take 1 tablet by mouth at bedtime as needed.    . Multiple Vitamins-Minerals (MULTIVITAMIN ADULT PO) Take 2 tablets by mouth daily.     Marland Kitchen oxyCODONE (OXY IR/ROXICODONE) 5 MG immediate release tablet Take 1 tablet (5 mg total) by mouth every 4 (four) hours as needed for moderate pain (pain score 4-6). 30 tablet 0  . pantoprazole (PROTONIX) 40 MG tablet Take 40 mg by mouth daily.    . rosuvastatin (CRESTOR) 10 MG tablet Take 10 mg by mouth at bedtime.     . sitaGLIPtin-metformin (JANUMET) 50-500 MG tablet Take 1 tablet by mouth 2 (two) times daily with a meal.    . SYNTHROID 125 MCG tablet Take 125 mcg by mouth daily before breakfast.   10  . enoxaparin (LOVENOX) 40 MG/0.4ML injection Inject 0.4 mLs (40 mg total) into the skin daily for 14 days. 14 mL 0  . traMADol (ULTRAM) 50 MG tablet Take 1-2 tablets (50-100 mg total) by mouth every 6 (six) hours as needed for moderate pain. (Patient not taking: Reported on 06/05/2019) 60 tablet 0   No current facility-administered medications on file prior to visit.    There are no Patient Instructions on file for this visit. No follow-ups on file.  Kris Hartmann, NP  This note was  completed with Sales executive.  Any errors are purely unintentional.

## 2019-08-18 ENCOUNTER — Other Ambulatory Visit: Payer: Self-pay | Admitting: Family Medicine

## 2019-08-18 DIAGNOSIS — Z1231 Encounter for screening mammogram for malignant neoplasm of breast: Secondary | ICD-10-CM

## 2019-08-20 ENCOUNTER — Ambulatory Visit
Admission: RE | Admit: 2019-08-20 | Discharge: 2019-08-20 | Disposition: A | Discharge status: Home / Self Care | Payer: BC Managed Care – PPO | Source: Ambulatory Visit | Attending: Family Medicine | Admitting: Family Medicine

## 2019-08-20 DIAGNOSIS — Z1231 Encounter for screening mammogram for malignant neoplasm of breast: Secondary | ICD-10-CM | POA: Diagnosis present

## 2020-08-06 ENCOUNTER — Other Ambulatory Visit: Payer: Self-pay | Admitting: Family Medicine

## 2020-08-06 DIAGNOSIS — Z1231 Encounter for screening mammogram for malignant neoplasm of breast: Secondary | ICD-10-CM

## 2020-09-29 ENCOUNTER — Ambulatory Visit
Admission: RE | Admit: 2020-09-29 | Discharge: 2020-09-29 | Disposition: A | Discharge status: Home / Self Care | Payer: BC Managed Care – PPO | Source: Ambulatory Visit | Attending: Family Medicine | Admitting: Family Medicine

## 2020-09-29 ENCOUNTER — Other Ambulatory Visit: Payer: Self-pay

## 2020-09-29 DIAGNOSIS — Z1231 Encounter for screening mammogram for malignant neoplasm of breast: Secondary | ICD-10-CM | POA: Diagnosis not present

## 2020-12-23 ENCOUNTER — Emergency Department: Payer: Medicare PPO

## 2020-12-23 ENCOUNTER — Emergency Department
Admission: EM | Admit: 2020-12-23 | Discharge: 2020-12-23 | Disposition: A | Discharge status: Home / Self Care | Payer: Medicare PPO | Attending: Emergency Medicine | Admitting: Emergency Medicine

## 2020-12-23 ENCOUNTER — Other Ambulatory Visit: Payer: Self-pay

## 2020-12-23 ENCOUNTER — Encounter: Payer: Self-pay | Admitting: Emergency Medicine

## 2020-12-23 DIAGNOSIS — Z85850 Personal history of malignant neoplasm of thyroid: Secondary | ICD-10-CM | POA: Diagnosis not present

## 2020-12-23 DIAGNOSIS — I10 Essential (primary) hypertension: Secondary | ICD-10-CM | POA: Diagnosis not present

## 2020-12-23 DIAGNOSIS — S0083XA Contusion of other part of head, initial encounter: Secondary | ICD-10-CM | POA: Diagnosis not present

## 2020-12-23 DIAGNOSIS — Z96652 Presence of left artificial knee joint: Secondary | ICD-10-CM | POA: Insufficient documentation

## 2020-12-23 DIAGNOSIS — R0789 Other chest pain: Secondary | ICD-10-CM | POA: Insufficient documentation

## 2020-12-23 DIAGNOSIS — Z79899 Other long term (current) drug therapy: Secondary | ICD-10-CM | POA: Diagnosis not present

## 2020-12-23 DIAGNOSIS — W108XXA Fall (on) (from) other stairs and steps, initial encounter: Secondary | ICD-10-CM | POA: Diagnosis not present

## 2020-12-23 DIAGNOSIS — E119 Type 2 diabetes mellitus without complications: Secondary | ICD-10-CM | POA: Diagnosis not present

## 2020-12-23 DIAGNOSIS — S39012A Strain of muscle, fascia and tendon of lower back, initial encounter: Secondary | ICD-10-CM | POA: Diagnosis not present

## 2020-12-23 DIAGNOSIS — S0990XA Unspecified injury of head, initial encounter: Secondary | ICD-10-CM | POA: Diagnosis not present

## 2020-12-23 DIAGNOSIS — S0993XA Unspecified injury of face, initial encounter: Secondary | ICD-10-CM | POA: Diagnosis present

## 2020-12-23 DIAGNOSIS — E039 Hypothyroidism, unspecified: Secondary | ICD-10-CM | POA: Insufficient documentation

## 2020-12-23 LAB — BASIC METABOLIC PANEL
Anion gap: 10 (ref 5–15)
BUN: 17 mg/dL (ref 8–23)
CO2: 25 mmol/L (ref 22–32)
Calcium: 9.4 mg/dL (ref 8.9–10.3)
Chloride: 105 mmol/L (ref 98–111)
Creatinine, Ser: 0.69 mg/dL (ref 0.44–1.00)
GFR, Estimated: >60 mL/min (ref >60)
Glucose, Bld: 213 mg/dL — ABNORMAL HIGH (ref 70–99)
Potassium: 3.2 mmol/L — ABNORMAL LOW (ref 3.5–5.1)
Sodium: 140 mmol/L (ref 135–145)

## 2020-12-23 LAB — TROPONIN I (HIGH SENSITIVITY)
Troponin I (High Sensitivity): 3 ng/L (ref <18)
Troponin I (High Sensitivity): 2 ng/L (ref <18)

## 2020-12-23 LAB — CBC
HCT: 35.2 % — ABNORMAL LOW (ref 36.0–46.0)
Hemoglobin: 12 g/dL (ref 12.0–15.0)
MCH: 27.8 pg (ref 26.0–34.0)
MCHC: 34.1 g/dL (ref 30.0–36.0)
MCV: 81.5 fL (ref 80.0–100.0)
Platelets: 251 10*3/uL (ref 150–400)
RBC: 4.32 MIL/uL (ref 3.87–5.11)
RDW: 14.2 % (ref 11.5–15.5)
WBC: 10.2 10*3/uL (ref 4.0–10.5)
nRBC: 0 % (ref 0.0–0.2)

## 2020-12-23 MED ORDER — ONDANSETRON 4 MG PO TBDP
4.0000 mg | ORAL_TABLET | Freq: Once | ORAL | Status: AC
  Administered 2020-12-23: 4 mg via ORAL
  Filled 2020-12-23: qty 1

## 2020-12-23 MED ORDER — POTASSIUM CHLORIDE CRYS ER 20 MEQ PO TBCR
40.0000 meq | EXTENDED_RELEASE_TABLET | Freq: Once | ORAL | Status: AC
  Administered 2020-12-23: 40 meq via ORAL
  Filled 2020-12-23: qty 2

## 2020-12-23 MED ORDER — HYDROCODONE-ACETAMINOPHEN 5-325 MG PO TABS: 1.0000 | ORAL_TABLET | Freq: Four times a day (QID) | ORAL | 0 refills | Status: AC | PRN

## 2020-12-23 MED ORDER — OXYCODONE HCL 5 MG PO TABS
5.0000 mg | ORAL_TABLET | Freq: Once | ORAL | Status: AC
  Administered 2020-12-23: 5 mg via ORAL
  Filled 2020-12-23: qty 1

## 2020-12-23 MED ORDER — NAPROXEN 500 MG PO TABS: 500.0000 mg | ORAL_TABLET | Freq: Two times a day (BID) | ORAL | 0 refills | Status: DC

## 2020-12-23 MED ORDER — TIZANIDINE HCL 4 MG PO TABS: 4.0000 mg | ORAL_TABLET | Freq: Three times a day (TID) | ORAL | 0 refills | Status: DC

## 2020-12-23 NOTE — ED Provider Notes (Signed)
[facility] Emergency Department Provider Note ____________________________________________   Event Date/Time   First MD Initiated Contact with Patient 12/23/20 1439     (approximate)  I have reviewed the triage vital signs and the nursing notes.   HISTORY  Chief Complaint Chest Pain and Fall  HPI Alisha Sanchez is a 65 y.o. female with history of diabetes, HTN, PE presents to the emergency department for treatment and evaluation after mechanical, non-syncopal fall prior to arrival. She was going down to do some laundry and only got her foot on part of the step and fell down about 10 steps. No loss of consciousness. She has scrapes on her face and pain in her chest wall and lower back/tailbone. No alleviating measures prior to arrival.          Past Medical History:  Diagnosis Date   Arthritis    knees   Cancer ([place])    Thyroid   Complication of anesthesia    Depression    Diabetes mellitus type 2 in obese ([place])    Dyspnea    GERD (gastroesophageal reflux disease)    Headache    migraines - 3-4x/yr   HLD (hyperlipidemia)    HTN (hypertension)    Hypothyroidism    no thyroid   Localized swelling of both lower legs    leaky veins   Lower extremity edema    Neck pain    left side   Numbness and tingling    numbness in hands and face tingles   PONV (postoperative nausea and vomiting)    Pulmonary embolus ([place])    after thyroid surgery   Vertigo    no episodes in approx 2 yrs    Patient Active Problem List   Diagnosis Date Noted   S/P IVC filter 05/13/2019   Diabetes mellitus type 2, uncomplicated ([place]) 45/36/4680   GERD (gastroesophageal reflux disease) 03/24/2019   Menorrhagia 03/24/2019   Migraines 03/24/2019   Pulmonary embolism ([place]) 03/24/2019   Thyroid cancer ([place]) 03/24/2019   Total knee replacement status 03/24/2019   Primary osteoarthritis of left shoulder 11/18/2018   Rotator cuff tendinitis, left 10/30/2018    Traumatic complete tear of left rotator cuff 10/30/2018   History of pulmonary embolus (PE) 08/04/2018   Primary osteoarthritis of left knee 05/14/2018   Chronic pain of left knee 05/14/2018   Chronic pain syndrome 05/14/2018   Diabetes ([place]) 09/22/2009   Hyperlipidemia 09/22/2009   Essential hypertension 09/22/2009   FATIGUE / MALAISE 09/22/2009   EDEMA 09/22/2009   CHEST PAIN UNSPECIFIED 09/22/2009    Past Surgical History:  Procedure Laterality Date   c-sections     x2    CATARACT EXTRACTION W/PHACO Left 08/27/2017   Procedure: CATARACT EXTRACTION PHACO AND INTRAOCULAR LENS PLACEMENT ([place]) LEFT DIABETIC;  Surgeon: Leandrew Koyanagi, MD;  Location: [facility];  Service: Ophthalmology;  Laterality: Left;  Diabetic - oral meds   COLONOSCOPY N/A 11/10/2015   Procedure: COLONOSCOPY;  Surgeon: Hulen Luster, MD;  Location: [facility];  Service: Gastroenterology;  Laterality: N/A;   ESOPHAGOGASTRODUODENOSCOPY N/A 11/10/2015   Procedure: ESOPHAGOGASTRODUODENOSCOPY (EGD) with dialation;  Surgeon: Hulen Luster, MD;  Location: [place];  Service: Gastroenterology;  Laterality: N/A;   IVC FILTER INSERTION N/A 03/17/2019   Procedure: IVC FILTER INSERTION;  Surgeon: Algernon Huxley, MD;  Location: [facility];  Service: Cardiovascular;  Laterality: N/A;   IVC FILTER REMOVAL N/A 06/05/2019   Procedure: IVC FILTER REMOVAL;  Surgeon: Algernon Huxley, MD;  Location: [facility];  Service: Cardiovascular;  Laterality: N/A;   KNEE ARTHROPLASTY Left 03/24/2019   Procedure: COMPUTER ASSISTED TOTAL KNEE ARTHROPLASTY - RNFA;  Surgeon: Dereck Leep, MD;  Location: [facility];  Service: Orthopedics;  Laterality: Left;   SHOULDER ARTHROSCOPY Left 11/15/2018   Procedure: ARTHROSCOPY SHOULDER WITH DEBRIDEMENT DECOMPRESSION, ROTATOR CUFF REPAIR AND POSSIBLE BICEPS TENODESIS;  Surgeon: Corky Mull, MD;  Location: [place];  Service: Orthopedics;  Laterality: Left;  SMITH  AND NEPHEW REGENTEN PATCH   THYROIDECTOMY      Prior to Admission medications   Medication Sig Start Date End Date Taking? Authorizing Provider  HYDROcodone-acetaminophen (NORCO/VICODIN) 5-325 MG tablet Take 1 tablet by mouth every 6 (six) hours as needed for up to 3 days for severe pain. 12/23/20 12/26/20 Yes Cherise Fedder B, FNP  naproxen (NAPROSYN) 500 MG tablet Take 1 tablet (500 mg total) by mouth 2 (two) times daily with a meal. 12/23/20  Yes Mae Cianci B, FNP  tiZANidine (ZANAFLEX) 4 MG tablet Take 1 tablet (4 mg total) by mouth 3 (three) times daily. 12/23/20  Yes Daryl Beehler B, FNP  alendronate (FOSAMAX) 70 MG tablet Take 70 mg by mouth once a week. Take with a full glass of water on an empty stomach.    [provider]  amLODipine (NORVASC) 5 MG tablet Take 5 mg by mouth at bedtime.     [provider]  enoxaparin (LOVENOX) 40 MG/0.4ML injection Inject 0.4 mLs (40 mg total) into the skin daily for 14 days. 03/27/19 04/10/19  Watt Climes, PA  hydrochlorothiazide (HYDRODIURIL) 25 MG tablet Take 25 mg by mouth daily.    [provider]  Melatonin 10 MG TABS Take 1 tablet by mouth at bedtime as needed.    [provider]  Multiple Vitamins-Minerals (MULTIVITAMIN ADULT PO) Take 2 tablets by mouth daily.     [provider]  pantoprazole (PROTONIX) 40 MG tablet Take 40 mg by mouth daily.    [provider]  rosuvastatin (CRESTOR) 10 MG tablet Take 10 mg by mouth at bedtime.     [provider]  sitaGLIPtin-metformin (JANUMET) 50-500 MG tablet Take 1 tablet by mouth 2 (two) times daily with a meal.    [provider]  SYNTHROID 125 MCG tablet Take 125 mcg by mouth daily before breakfast.  04/20/18   [provider]    Allergies Patient has no known allergies.  Family History  Problem Relation Age of Onset   Liver cancer Father    Lung cancer Father    Diabetes Father    Stroke Other    Breast cancer  Maternal Aunt 32   Breast cancer Paternal Aunt 3    Social History Social History   Tobacco Use   Smoking status: Never   Smokeless tobacco: Never   Tobacco comments:    tobacco use- no  Vaping Use   Vaping Use: Never used  Substance Use Topics   Alcohol use: Yes    Alcohol/week: 1.0 standard drink    Types: 1 Glasses of wine per week    Comment: occassional wine   Drug use: No    Review of Systems  Constitutional: No fever/chills Eyes: No visual changes. ENT: No sore throat. Cardiovascular: Denies chest pain. Respiratory: Denies shortness of breath. Gastrointestinal: No abdominal pain.  No nausea, no vomiting.  No diarrhea.  No constipation. Genitourinary: Negative for dysuria. Musculoskeletal: Positive for chest  wall pain, low back, and tailbone pain. Skin: Positive for facial abrasions Neurological: Negative for headaches, focal weakness or numbness. ____________________________________________   PHYSICAL EXAM:  VITAL SIGNS: ED Triage Vitals  Enc Vitals Group     BP 12/23/20 1434 (!) 141/84     Pulse Rate 12/23/20 1434 84     Resp 12/23/20 1434 18     Temp 12/23/20 1434 98.7 F (37.1 C)     Temp Source 12/23/20 1434 Oral     SpO2 12/23/20 1434 97 %     Weight 12/23/20 1432 180 lb (81.6 kg)     Height 12/23/20 1432 5\' 7"  (1.702 m)     Head Circumference --      Peak Flow --      Pain Score 12/23/20 1432 8     Pain Loc --      Pain Edu? --      Excl. in [place]? --     Constitutional: Alert and oriented. Well appearing and in no acute distress. Eyes: Conjunctivae are normal. PERRL. EOMI. Head: No contusions or hematoma on scalp Nose: No congestion/rhinnorhea. Mouth/Throat: Mucous membranes are moist.  Oropharynx non-erythematous. Neck: No stridor.   Hematological/Lymphatic/Immunilogical: No cervical lymphadenopathy. Cardiovascular: Normal rate, regular rhythm. Grossly normal heart sounds.  Good peripheral circulation. Respiratory: Normal respiratory  effort.  No retractions. Lungs CTAB. Gastrointestinal: Soft and nontender. No distention. No abdominal bruits. No CVA tenderness. Genitourinary:  Musculoskeletal: Midsternal chest wall tenderness. Lower lumbar and coccyx tenderness. Neurologic:  Normal speech and language. No gross focal neurologic deficits are appreciated. No gait instability. Skin:  Skin is warm, dry and intact. No rash noted. Psychiatric: Mood and affect are normal. Speech and behavior are normal.  ____________________________________________   LABS (all labs ordered are listed, but only abnormal results are displayed)  Labs Reviewed  BASIC METABOLIC PANEL - Abnormal; Notable for the following components:      Result Value   Potassium 3.2 (*)    Glucose, Bld 213 (*)    All other components within normal limits  CBC - Abnormal; Notable for the following components:   HCT 35.2 (*)    All other components within normal limits  TROPONIN I (HIGH SENSITIVITY)  TROPONIN I (HIGH SENSITIVITY)   ____________________________________________  EKG  ED ECG REPORT I, Shakyra Mattera, FNP-BC personally viewed and interpreted this ECG.   Date: 12/23/2020  EKG Time: 1436  Rate: 85  Rhythm: normal EKG, normal sinus rhythm  Axis: normal  Intervals:none  ST&T Change: no ST elevation  ____________________________________________  RADIOLOGY  ED MD interpretation:    CT head, cervical spine, and chest without acute abnormality.  Chest x-ray concerning for rib fractures. Images of the lumbar spine negative for acute concerns.   I, Sherrie George, personally viewed and evaluated these images (plain radiographs) as part of my medical decision making, as well as reviewing the written report by the radiologist.  Official radiology report(s): DG Chest 2 View  Result Date: 12/23/2020 CLINICAL DATA:  Chest pain after falling down steps today. EXAM: CHEST - 2 VIEW COMPARISON:  08/11/2013 FINDINGS: Heart size is normal. There  is mild elevation of the RIGHT hemidiaphragm possibly related to hypoinflation. There is no pneumothorax. Suspect deformities of the anterior LEFT ribs 3, 4, 5, and possibly 6. There is no pleural effusion or consolidation. Significant degenerative changes are seen in the thoracic spine. Surgical clips are noted in the superior mediastinum. IMPRESSION: 1. Suspect LEFT rib fractures, of indeterminate age. Consider dedicated rib  views if needed. 2. No pneumothorax. Electronically Signed   By: Nolon Nations M.D.   On: 12/23/2020 15:15   DG Lumbar Spine 2-3 Views  Result Date: 12/23/2020 CLINICAL DATA:  65 year old female with fall and back pain. EXAM: LUMBAR SPINE - 2-3 VIEW COMPARISON:  None. FINDINGS: There is no acute fracture or dislocation. The bones are osteopenic. Multilevel degenerative changes and spurring. The visualized posterior elements are intact. Lower lumbar facet arthropathy. The soft tissues are unremarkable. IMPRESSION: No acute/traumatic lumbar spine pathology. Electronically Signed   By: Anner Crete M.D.   On: 12/23/2020 16:08   CT Head Wo Contrast  Result Date: 12/23/2020 CLINICAL DATA:  Head trauma, minor (Age >= 65y) Fall down stairs. EXAM: CT HEAD WITHOUT CONTRAST TECHNIQUE: Contiguous axial images were obtained from the base of the skull through the vertex without intravenous contrast. COMPARISON:  Head CT 07/20/2011 FINDINGS: Brain: Brain volume is normal for age. No intracranial hemorrhage, mass effect, or midline shift. No hydrocephalus. The basilar cisterns are patent. No evidence of territorial infarct or acute ischemia. No extra-axial or intracranial fluid collection. Vascular: No hyperdense vessel. Skull: No skull fracture. There are multiple venous lakes on right knee granulations are stable from prior exam, benign. Frontal hyperostosis. Sinuses/Orbits: Assessed on concurrent face CT, reported separately. Other: None. IMPRESSION: No acute intracranial abnormality. No  skull fracture. Electronically Signed   By: Keith Rake M.D.   On: 12/23/2020 16:21   CT Chest Wo Contrast  Result Date: 12/23/2020 CLINICAL DATA:  65 year old female with chest trauma. EXAM: CT CHEST WITHOUT CONTRAST TECHNIQUE: Multidetector CT imaging of the chest was performed following the standard protocol without IV contrast. COMPARISON:  Chest CT dated 08/11/2013. FINDINGS: Evaluation of this exam is limited in the absence of intravenous contrast. Cardiovascular: There is no cardiomegaly or pericardial effusion. Mild atherosclerotic calcification of the thoracic aorta. The central pulmonary arteries are unremarkable. Mediastinum/Nodes: No hilar or mediastinal adenopathy. The esophagus is grossly unremarkable. Thyroidectomy. No mediastinal fluid collection. Lungs/Pleura: The lungs are clear. There is no pleural effusion pneumothorax. The central airways are patent. Upper Abdomen: No acute abnormality. Musculoskeletal: Osteopenia with degenerative changes of the spine. Multiple old left rib fractures. No acute osseous pathology. IMPRESSION: No acute/traumatic intrathoracic pathology. Electronically Signed   By: Anner Crete M.D.   On: 12/23/2020 16:23   CT Cervical Spine Wo Contrast  Result Date: 12/23/2020 CLINICAL DATA:  Neck trauma (Age >= 65y) Fall down stairs. EXAM: CT CERVICAL SPINE WITHOUT CONTRAST TECHNIQUE: Multidetector CT imaging of the cervical spine was performed without intravenous contrast. Multiplanar CT image reconstructions were also generated. COMPARISON:  Cervical spine CT 03/06/2011 FINDINGS: Alignment: Mild chronic rightward scoliotic curvature of the lower cervical spine. No traumatic subluxation. Skull base and vertebrae: No acute fracture. Vertebral body heights are maintained. The dens and skull base are intact. Sclerotic focus within the right T1 is unchanged from prior exam and consistent with benign bone island. Soft tissues and spinal canal: No prevertebral fluid  or swelling. No visible canal hematoma. Disc levels: Mild degenerative disc disease at C4-C5 and C5-C6. There is moderate multilevel facet hypertrophy. No high-grade canal stenosis. Upper chest: Thyroidectomy.  No acute findings. Other: None. IMPRESSION: 1. No acute fracture or subluxation of the cervical spine. 2. Mild degenerative disc disease with moderate facet hypertrophy. Electronically Signed   By: Keith Rake M.D.   On: 12/23/2020 16:27   CT Maxillofacial Wo Contrast  Result Date: 12/23/2020 CLINICAL DATA:  Facial trauma Fall down stairs.  EXAM: CT MAXILLOFACIAL WITHOUT CONTRAST TECHNIQUE: Multidetector CT imaging of the maxillofacial structures was performed. Multiplanar CT image reconstructions were also generated. COMPARISON:  None. FINDINGS: Osseous: No acute facial bone fracture. Nasal bone, zygomatic arches, and mandibles are intact. The temporomandibular joints are congruent. There is trace leftward nasal septal deviation. Orbits: No orbital fracture or globe injury. Bilateral cataract resection. Sinuses: No sinus fracture or fluid level. Mastoid air cells are clear. Soft tissues: Negative. Limited intracranial: Assessed on concurrent head CT, reported separately. IMPRESSION: No acute facial bone fracture. Electronically Signed   By: Keith Rake M.D.   On: 12/23/2020 16:23    ____________________________________________   PROCEDURES  Procedure(s) performed (including Critical Care):  Procedures  ____________________________________________   INITIAL IMPRESSION / ASSESSMENT AND PLAN     65 year old female presenting to the emergency department for evaluation after mechanical, non-syncopal fall. See HPI will get screening labs and imagaing.  DIFFERENTIAL DIAGNOSIS  ICH, facial fractures, rib fracture, sternal fracture, cardiac contusion, compression fracture lumbar spine, coccyx fracture.  ED COURSE  Images are all reassuring. Some improvement after medications.  Will discharge her home with pain medication, muscle relaxer, and anti inflammatory. She will also be encouraged to use incentive spirometry and doughnut pillow.   ER return precautions discussed.    ___________________________________________   FINAL CLINICAL IMPRESSION(S) / ED DIAGNOSES  Final diagnoses:  Chest wall pain  Facial contusion, initial encounter  Minor head injury, initial encounter  Acute myofascial strain of lumbar region, initial encounter     ED Discharge Orders          Ordered    HYDROcodone-acetaminophen (NORCO/VICODIN) 5-325 MG tablet  Every 6 hours PRN        12/23/20 1655    tiZANidine (ZANAFLEX) 4 MG tablet  3 times daily        12/23/20 1655    naproxen (NAPROSYN) 500 MG tablet  2 times daily with meals        12/23/20 [place] was evaluated in Emergency Department on 12/23/2020 for the symptoms described in the history of present illness. She was evaluated in the context of the global COVID-19 pandemic, which necessitated consideration that the patient might be at risk for infection with the SARS-CoV-2 virus that causes COVID-19. Institutional protocols and algorithms that pertain to the evaluation of patients at risk for COVID-19 are in a state of rapid change based on information released by regulatory bodies including the CDC and federal and state organizations. These policies and algorithms were followed during the patient's care in the ED.   Note:  This document was prepared using Dragon voice recognition software and may include unintentional dictation errors.    Victorino Dike, FNP 12/23/20 1814    Duffy Bruce, MD 12/29/20 [phone]

## 2020-12-23 NOTE — ED Notes (Signed)
Recollect on CBC per lab. Primary RN notified.

## 2020-12-23 NOTE — Discharge Instructions (Addendum)
Do not take the Zanaflex and the Norco within 4 hours of each other. Be aware that either of the two will make you dizzy/drowsy.  Follow up with primary care or return to the ER for symptoms of concern.

## 2020-12-23 NOTE — ED Notes (Signed)
ED Provider at bedside. 

## 2020-12-23 NOTE — ED Triage Notes (Signed)
Pt comes into the ED \\via  POV c/o mechanical fall down her stairs from missing a step.  Pt landed backwards and c/o low back and central chest pain that is worse with exhaling.  Pt denies any cardiac history and presents ambulatory with even and unlabored respirations.  Pt denies any SHOB, dizziness, or nausea.  PT does have a h/o blood clots.

## 2021-01-11 ENCOUNTER — Encounter: Payer: Self-pay | Admitting: Emergency Medicine

## 2021-01-11 ENCOUNTER — Other Ambulatory Visit: Payer: Self-pay

## 2021-01-11 ENCOUNTER — Observation Stay
Admission: EM | Admit: 2021-01-11 | Discharge: 2021-01-14 | Disposition: A | Discharge status: Home / Self Care | Payer: Medicare PPO | Attending: Emergency Medicine | Admitting: Emergency Medicine

## 2021-01-11 ENCOUNTER — Emergency Department: Payer: Medicare PPO

## 2021-01-11 DIAGNOSIS — I10 Essential (primary) hypertension: Secondary | ICD-10-CM | POA: Diagnosis present

## 2021-01-11 DIAGNOSIS — Z20822 Contact with and (suspected) exposure to covid-19: Secondary | ICD-10-CM | POA: Insufficient documentation

## 2021-01-11 DIAGNOSIS — Z7982 Long term (current) use of aspirin: Secondary | ICD-10-CM | POA: Diagnosis not present

## 2021-01-11 DIAGNOSIS — E890 Postprocedural hypothyroidism: Secondary | ICD-10-CM

## 2021-01-11 DIAGNOSIS — E039 Hypothyroidism, unspecified: Secondary | ICD-10-CM | POA: Diagnosis not present

## 2021-01-11 DIAGNOSIS — Z85850 Personal history of malignant neoplasm of thyroid: Secondary | ICD-10-CM | POA: Diagnosis not present

## 2021-01-11 DIAGNOSIS — E785 Hyperlipidemia, unspecified: Secondary | ICD-10-CM | POA: Diagnosis present

## 2021-01-11 DIAGNOSIS — M79606 Pain in leg, unspecified: Secondary | ICD-10-CM

## 2021-01-11 DIAGNOSIS — M4848XA Fatigue fracture of vertebra, sacral and sacrococcygeal region, initial encounter for fracture: Secondary | ICD-10-CM | POA: Diagnosis present

## 2021-01-11 DIAGNOSIS — K219 Gastro-esophageal reflux disease without esophagitis: Secondary | ICD-10-CM | POA: Diagnosis present

## 2021-01-11 DIAGNOSIS — Z7984 Long term (current) use of oral hypoglycemic drugs: Secondary | ICD-10-CM | POA: Insufficient documentation

## 2021-01-11 DIAGNOSIS — Z79899 Other long term (current) drug therapy: Secondary | ICD-10-CM | POA: Diagnosis not present

## 2021-01-11 DIAGNOSIS — C73 Malignant neoplasm of thyroid gland: Secondary | ICD-10-CM | POA: Diagnosis present

## 2021-01-11 DIAGNOSIS — Z419 Encounter for procedure for purposes other than remedying health state, unspecified: Secondary | ICD-10-CM

## 2021-01-11 DIAGNOSIS — E119 Type 2 diabetes mellitus without complications: Secondary | ICD-10-CM | POA: Diagnosis not present

## 2021-01-11 DIAGNOSIS — R2689 Other abnormalities of gait and mobility: Secondary | ICD-10-CM | POA: Insufficient documentation

## 2021-01-11 DIAGNOSIS — Z7901 Long term (current) use of anticoagulants: Secondary | ICD-10-CM | POA: Insufficient documentation

## 2021-01-11 DIAGNOSIS — E782 Mixed hyperlipidemia: Secondary | ICD-10-CM

## 2021-01-11 DIAGNOSIS — Z86711 Personal history of pulmonary embolism: Secondary | ICD-10-CM | POA: Diagnosis not present

## 2021-01-11 DIAGNOSIS — M79604 Pain in right leg: Secondary | ICD-10-CM

## 2021-01-11 DIAGNOSIS — M5441 Lumbago with sciatica, right side: Secondary | ICD-10-CM

## 2021-01-11 DIAGNOSIS — M8448XA Pathological fracture, other site, initial encounter for fracture: Secondary | ICD-10-CM | POA: Diagnosis present

## 2021-01-11 LAB — CBC WITH DIFFERENTIAL/PLATELET
Abs Immature Granulocytes: 0.05 10*3/uL (ref 0.00–0.07)
Basophils Absolute: 0.1 10*3/uL (ref 0.0–0.1)
Basophils Relative: 1 %
Eosinophils Absolute: 0.1 10*3/uL (ref 0.0–0.5)
Eosinophils Relative: 1 %
HCT: 40.4 % (ref 36.0–46.0)
Hemoglobin: 13.5 g/dL (ref 12.0–15.0)
Immature Granulocytes: 1 %
Lymphocytes Relative: 20 %
Lymphs Abs: 1.5 10*3/uL (ref 0.7–4.0)
MCH: 28.3 pg (ref 26.0–34.0)
MCHC: 33.4 g/dL (ref 30.0–36.0)
MCV: 84.7 fL (ref 80.0–100.0)
Monocytes Absolute: 0.5 10*3/uL (ref 0.1–1.0)
Monocytes Relative: 6 %
Neutro Abs: 5.5 10*3/uL (ref 1.7–7.7)
Neutrophils Relative %: 71 %
Platelets: 265 10*3/uL (ref 150–400)
RBC: 4.77 MIL/uL (ref 3.87–5.11)
RDW: 14.4 % (ref 11.5–15.5)
WBC: 7.6 10*3/uL (ref 4.0–10.5)
nRBC: 0 % (ref 0.0–0.2)

## 2021-01-11 LAB — URINALYSIS, COMPLETE (UACMP) WITH MICROSCOPIC
Bacteria, UA: NONE SEEN
Bilirubin Urine: NEGATIVE
Glucose, UA: 50 mg/dL — AB
Hgb urine dipstick: NEGATIVE
Ketones, ur: NEGATIVE mg/dL
Nitrite: NEGATIVE
Protein, ur: NEGATIVE mg/dL
Specific Gravity, Urine: 1.013 (ref 1.005–1.030)
pH: 7 (ref 5.0–8.0)

## 2021-01-11 LAB — COMPREHENSIVE METABOLIC PANEL
ALT: 13 U/L (ref 0–44)
AST: 24 U/L (ref 15–41)
Albumin: 4.2 g/dL (ref 3.5–5.0)
Alkaline Phosphatase: 96 U/L (ref 38–126)
Anion gap: 10 (ref 5–15)
BUN: 18 mg/dL (ref 8–23)
CO2: 25 mmol/L (ref 22–32)
Calcium: 9.3 mg/dL (ref 8.9–10.3)
Chloride: 104 mmol/L (ref 98–111)
Creatinine, Ser: 0.55 mg/dL (ref 0.44–1.00)
GFR, Estimated: >60 mL/min (ref >60)
Glucose, Bld: 100 mg/dL — ABNORMAL HIGH (ref 70–99)
Potassium: 4 mmol/L (ref 3.5–5.1)
Sodium: 139 mmol/L (ref 135–145)
Total Bilirubin: 1.5 mg/dL — ABNORMAL HIGH (ref 0.3–1.2)
Total Protein: 8.2 g/dL — ABNORMAL HIGH (ref 6.5–8.1)

## 2021-01-11 LAB — RESP PANEL BY RT-PCR (FLU A&B, COVID) ARPGX2
Influenza A by PCR: NEGATIVE
Influenza B by PCR: NEGATIVE
SARS Coronavirus 2 by RT PCR: NEGATIVE

## 2021-01-11 MED ORDER — INSULIN ASPART 100 UNIT/ML IJ SOLN
0.0000 [IU] | Freq: Three times a day (TID) | INTRAMUSCULAR | Status: DC
  Administered 2021-01-12: 3 [IU] via SUBCUTANEOUS
  Administered 2021-01-12 – 2021-01-13 (×2): 2 [IU] via SUBCUTANEOUS
  Administered 2021-01-13 – 2021-01-14 (×4): 3 [IU] via SUBCUTANEOUS
  Filled 2021-01-11 (×7): qty 1

## 2021-01-11 MED ORDER — POLYETHYLENE GLYCOL 3350 17 G PO PACK: 17.0000 g | PACK | Freq: Every day | ORAL | Status: DC | PRN

## 2021-01-11 MED ORDER — MELATONIN 5 MG PO TABS: 10.0000 mg | ORAL_TABLET | Freq: Every evening | ORAL | Status: DC | PRN

## 2021-01-11 MED ORDER — ROSUVASTATIN CALCIUM 10 MG PO TABS
10.0000 mg | ORAL_TABLET | Freq: Every day | ORAL | Status: DC
  Administered 2021-01-11 – 2021-01-13 (×3): 10 mg via ORAL
  Filled 2021-01-11 (×4): qty 1

## 2021-01-11 MED ORDER — ACETAMINOPHEN 325 MG PO TABS: 650.0000 mg | ORAL_TABLET | Freq: Four times a day (QID) | ORAL | Status: DC | PRN

## 2021-01-11 MED ORDER — ACETAMINOPHEN 650 MG RE SUPP: 650.0000 mg | Freq: Four times a day (QID) | RECTAL | Status: DC | PRN

## 2021-01-11 MED ORDER — ACETAMINOPHEN 325 MG PO TABS
650.0000 mg | ORAL_TABLET | Freq: Once | ORAL | Status: AC
  Administered 2021-01-11: 650 mg via ORAL
  Filled 2021-01-11: qty 2

## 2021-01-11 MED ORDER — TIZANIDINE HCL 4 MG PO TABS
4.0000 mg | ORAL_TABLET | Freq: Three times a day (TID) | ORAL | Status: DC
  Administered 2021-01-11 – 2021-01-14 (×8): 4 mg via ORAL
  Filled 2021-01-11 (×10): qty 1

## 2021-01-11 MED ORDER — MORPHINE SULFATE (PF) 4 MG/ML IV SOLN
4.0000 mg | Freq: Once | INTRAVENOUS | Status: AC
  Administered 2021-01-11: 4 mg via INTRAMUSCULAR
  Filled 2021-01-11: qty 1

## 2021-01-11 MED ORDER — HYDROCODONE-ACETAMINOPHEN 5-325 MG PO TABS
1.0000 | ORAL_TABLET | ORAL | Status: DC | PRN
  Administered 2021-01-11 – 2021-01-13 (×4): 1 via ORAL
  Administered 2021-01-13 – 2021-01-14 (×2): 2 via ORAL
  Filled 2021-01-11: qty 1
  Filled 2021-01-11: qty 2
  Filled 2021-01-11: qty 1
  Filled 2021-01-11: qty 2
  Filled 2021-01-11 (×2): qty 1

## 2021-01-11 MED ORDER — SODIUM CHLORIDE 0.9% FLUSH
3.0000 mL | Freq: Two times a day (BID) | INTRAVENOUS | Status: DC
  Administered 2021-01-11 – 2021-01-14 (×6): 3 mL via INTRAVENOUS

## 2021-01-11 MED ORDER — HYDROMORPHONE HCL 1 MG/ML IJ SOLN: 0.5000 mg | INTRAMUSCULAR | Status: DC | PRN

## 2021-01-11 MED ORDER — LEVOTHYROXINE SODIUM 50 MCG PO TABS
125.0000 ug | ORAL_TABLET | Freq: Every day | ORAL | Status: DC
  Administered 2021-01-12: 125 ug via ORAL
  Filled 2021-01-11: qty 1

## 2021-01-11 MED ORDER — HYDROCODONE-ACETAMINOPHEN 5-325 MG PO TABS
1.0000 | ORAL_TABLET | Freq: Once | ORAL | Status: AC
  Administered 2021-01-11: 1 via ORAL
  Filled 2021-01-11: qty 1

## 2021-01-11 MED ORDER — PANTOPRAZOLE SODIUM 40 MG PO TBEC
40.0000 mg | DELAYED_RELEASE_TABLET | Freq: Every day | ORAL | Status: DC
  Administered 2021-01-12 – 2021-01-14 (×3): 40 mg via ORAL
  Filled 2021-01-11 (×3): qty 1

## 2021-01-11 NOTE — ED Provider Notes (Signed)
[facility] Emergency Department Provider Note  ____________________________________________   Event Date/Time   First MD Initiated Contact with Patient 01/11/21 1502     (approximate)  I have reviewed the triage vital signs and the nursing notes.   HISTORY  Chief Complaint Leg Pain   HPI LAQUEENA HINCHEY is a 65 y.o. female who presents to the emergency department for evaluation of low back pain, worsening right leg pain and numbness since a fall 12 days ago.  Patient states initial fall occurred due to a misstep on some basement stairs and went down approximately 10 stairs.  She states that she has had pain all over since that time, however progressive worsening of pain in the low back as well as progressive worsening of pain, numbness, tingling and weakness of the right lower extremity.  Her normal status is that she typically is able to be very on her own, was assisting taking care of her parents, however she has had progressive inability to ambulate and is now requiring a walker for any ambulation.  She also describes multiple episodes of loss of bladder control since the accident, however she states that these were related to knowing that she had to urinate, attempting to get to the bathroom and unable to get there in time.  She denies any fevers, chills.  She also states that she has completed multiple courses of Norco and muscle relaxant with minimal relief in her symptoms.  She also saw a chiropractor yesterday and received several "adjustments" of "discs that were out of place" however was unable to tolerate the sacral manipulation that was attempted.         Past Medical History:  Diagnosis Date   Arthritis    knees   Cancer ([place])    Thyroid   Complication of anesthesia    Depression    Diabetes mellitus type 2 in obese (HCC)    Dyspnea    GERD (gastroesophageal reflux disease)    Headache    migraines - 3-4x/yr   HLD  (hyperlipidemia)    HTN (hypertension)    Hypothyroidism    no thyroid   Localized swelling of both lower legs    leaky veins   Lower extremity edema    Neck pain    left side   Numbness and tingling    numbness in hands and face tingles   PONV (postoperative nausea and vomiting)    Pulmonary embolus ([place])    after thyroid surgery   Vertigo    no episodes in approx 2 yrs    Patient Active Problem List   Diagnosis Date Noted   S/P IVC filter 05/13/2019   Diabetes mellitus type 2, uncomplicated ([place]) 38/75/6433   GERD (gastroesophageal reflux disease) 03/24/2019   Menorrhagia 03/24/2019   Migraines 03/24/2019   Pulmonary embolism ([place]) 03/24/2019   Thyroid cancer ([place]) 03/24/2019   Total knee replacement status 03/24/2019   Primary osteoarthritis of left shoulder 11/18/2018   Rotator cuff tendinitis, left 10/30/2018   Traumatic complete tear of left rotator cuff 10/30/2018   History of pulmonary embolus ([place]) 08/04/2018   Primary osteoarthritis of left knee 05/14/2018   Chronic pain of left knee 05/14/2018   Chronic pain syndrome 05/14/2018   Diabetes ([place]) 09/22/2009   Hyperlipidemia 09/22/2009   Essential hypertension 09/22/2009   FATIGUE / MALAISE 09/22/2009   EDEMA 09/22/2009   CHEST PAIN UNSPECIFIED 09/22/2009    Past Surgical History:  Procedure Laterality Date  c-sections     x2    CATARACT EXTRACTION W/PHACO Left 08/27/2017   Procedure: CATARACT EXTRACTION PHACO AND INTRAOCULAR LENS PLACEMENT ([place]) LEFT DIABETIC;  Surgeon: Leandrew Koyanagi, MD;  Location: [place];  Service: Ophthalmology;  Laterality: Left;  Diabetic - oral meds   COLONOSCOPY N/A 11/10/2015   Procedure: COLONOSCOPY;  Surgeon: Hulen Luster, MD;  Location: [place];  Service: Gastroenterology;  Laterality: N/A;   ESOPHAGOGASTRODUODENOSCOPY N/A 11/10/2015   Procedure: ESOPHAGOGASTRODUODENOSCOPY (EGD) with dialation;  Surgeon: Hulen Luster, MD;  Location: [place];   Service: Gastroenterology;  Laterality: N/A;   IVC FILTER INSERTION N/A 03/17/2019   Procedure: IVC FILTER INSERTION;  Surgeon: Algernon Huxley, MD;  Location: [place] [facility];  Service: Cardiovascular;  Laterality: N/A;   IVC FILTER REMOVAL N/A 06/05/2019   Procedure: IVC FILTER REMOVAL;  Surgeon: Algernon Huxley, MD;  Location: [place] [facility];  Service: Cardiovascular;  Laterality: N/A;   KNEE ARTHROPLASTY Left 03/24/2019   Procedure: COMPUTER ASSISTED TOTAL KNEE ARTHROPLASTY - RNFA;  Surgeon: Dereck Leep, MD;  Location: [facility];  Service: Orthopedics;  Laterality: Left;   SHOULDER ARTHROSCOPY Left 11/15/2018   Procedure: ARTHROSCOPY SHOULDER WITH DEBRIDEMENT DECOMPRESSION, ROTATOR CUFF REPAIR AND POSSIBLE BICEPS TENODESIS;  Surgeon: Corky Mull, MD;  Location: [place];  Service: Orthopedics;  Laterality: Left;  SMITH AND NEPHEW REGENTEN PATCH   THYROIDECTOMY      Prior to Admission medications   Medication Sig Start Date End Date Taking? Authorizing Provider  alendronate (FOSAMAX) 70 MG tablet Take 70 mg by mouth once a week. Take with a full glass of water on an empty stomach.    [provider]  amLODipine (NORVASC) 5 MG tablet Take 5 mg by mouth at bedtime.     [provider]  enoxaparin (LOVENOX) 40 MG/0.4ML injection Inject 0.4 mLs (40 mg total) into the skin daily for 14 days. 03/27/19 04/10/19  Watt Climes, PA  hydrochlorothiazide (HYDRODIURIL) 25 MG tablet Take 25 mg by mouth daily.    [provider]  Melatonin 10 MG TABS Take 1 tablet by mouth at bedtime as needed.    [provider]  Multiple Vitamins-Minerals (MULTIVITAMIN ADULT PO) Take 2 tablets by mouth daily.     [provider]  naproxen (NAPROSYN) 500 MG tablet Take 1 tablet (500 mg total) by mouth 2 (two) times daily with a meal. 12/23/20   Triplett, Cari B, FNP  pantoprazole (PROTONIX) 40 MG tablet Take 40 mg by mouth daily.    [provider]   rosuvastatin (CRESTOR) 10 MG tablet Take 10 mg by mouth at bedtime.     [provider]  sitaGLIPtin-metformin (JANUMET) 50-500 MG tablet Take 1 tablet by mouth 2 (two) times daily with a meal.    [provider]  SYNTHROID 125 MCG tablet Take 125 mcg by mouth daily before breakfast.  04/20/18   [provider]  tiZANidine (ZANAFLEX) 4 MG tablet Take 1 tablet (4 mg total) by mouth 3 (three) times daily. 12/23/20   Victorino Dike, FNP    Allergies Patient has no known allergies.  Family History  Problem Relation Age of Onset   Liver cancer Father    Lung cancer Father    Diabetes Father    Stroke Other    Breast cancer Maternal Aunt 32   Breast cancer Paternal Aunt 28    Social History Social History   Tobacco Use  Smoking status: Never   Smokeless tobacco: Never   Tobacco comments:    tobacco use- no  Vaping Use   Vaping Use: Never used  Substance Use Topics   Alcohol use: Yes    Alcohol/week: 1.0 standard drink    Types: 1 Glasses of wine per week    Comment: occassional wine   Drug use: No    Review of Systems Constitutional: No fever/chills Eyes: No visual changes. ENT: No sore throat. Cardiovascular: Denies chest pain. Respiratory: Denies shortness of breath. Gastrointestinal: No abdominal pain.  No nausea, no vomiting.  No diarrhea.  No constipation. Genitourinary: Negative for dysuria. Musculoskeletal: + Back pain, right leg pain Skin: Negative for rash. Neurological: Negative for headaches, focal weakness or numbness.   ____________________________________________   PHYSICAL EXAM:  VITAL SIGNS: ED Triage Vitals  Enc Vitals Group     BP 01/11/21 0928 105/68     Pulse Rate 01/11/21 0928 77     Resp 01/11/21 0928 16     Temp 01/11/21 0928 98.3 F (36.8 C)     Temp Source 01/11/21 0928 Oral     SpO2 01/11/21 0928 99 %     Weight 01/11/21 0934 180 lb (81.6 kg)     Height 01/11/21 0934 5\' 7"  (1.702 m)     Head  Circumference --      Peak Flow --      Pain Score 01/11/21 0934 5     Pain Loc --      Pain Edu? --      Excl. in [place]? --    Constitutional: Alert and oriented. Well appearing and in no acute distress. Eyes: Conjunctivae are normal. PERRL. EOMI. Head: Atraumatic. Nose: No congestion/rhinnorhea. Mouth/Throat: Mucous membranes are moist.  Oropharynx non-erythematous. Neck: No stridor.   Cardiovascular: Normal rate, regular rhythm. Grossly normal heart sounds.  Good peripheral circulation. Respiratory: Normal respiratory effort.  No retractions. Lungs CTAB. Gastrointestinal: Soft and nontender. No distention. No abdominal bruits. No CVA tenderness. Musculoskeletal: There is diffuse tenderness of the lumbar spine, worse at the SI joints and sacrum.  No palpable deformity or crepitus appreciated.  The patient has 5/5 strength in ankle plantarflexion and dorsiflexion, however is unable to attempt a straight leg raise on either side.  She endorses numbness to light touch of the lateral aspect of the right thigh, but endorses symmetric sensation elsewhere of the bilateral lower extremities.  Dorsal pedal pulses 2+ bilaterally. Neurologic:  Normal speech and language. No gross focal neurologic deficits other than as described above. Skin:  Skin is warm, dry and intact. No rash noted. Psychiatric: Mood and affect are normal. Speech and behavior are normal.  ____________________________________________   LABS (all labs ordered are listed, but only abnormal results are displayed)  Labs Reviewed  URINALYSIS, COMPLETE (UACMP) WITH MICROSCOPIC   ____________________________________________  RADIOLOGY  Official radiology report(s): MR LUMBAR SPINE WO CONTRAST  Result Date: 01/11/2021 CLINICAL DATA:  Low back pain.  Cauda equina syndrome suspected. EXAM: MRI LUMBAR SPINE WITHOUT CONTRAST TECHNIQUE: Multiplanar, multisequence MR imaging of the lumbar spine was performed. No intravenous contrast was  administered. COMPARISON:  None. FINDINGS: Segmentation:  Standard. Alignment:  Physiologic. Vertebrae: Bilateral sacral fracture with prominent marrow edema, consistent with acute/subacute process. No evidence of discitis or aggressive bone lesion. Conus medullaris and cauda equina: Conus extends to the L1 level. Conus and cauda equina appear normal. Paraspinal and other soft tissues: Negative. Disc levels: T12-L1: No spinal canal or neural foraminal stenosis. L1-2: Shallow  disc bulge. No spinal canal or neural foraminal stenosis. L2-3: Shallow disc bulge with left foraminal annular tear and mild facet degenerative changes. No significant spinal canal or neural foraminal stenosis. L3-4: Shallow disc bulge and hypertrophic facet degenerative changes without significant spinal canal or neural foraminal stenosis. L4-5: Mild facet degenerative changes. No spinal canal or neural foraminal stenosis. L5-S1: Shallow disc bulge, slightly asymmetric to the left and mild facet degenerative changes. No significant spinal canal or neural foraminal stenosis. IMPRESSION: 1. 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 2. Mild degenerative changes of the lumbar spine without significant spinal canal or neural foraminal stenosis. 3. 4. These results were called by telephone at the time of interpretation on 01/11/2021 at 2:07 pm to provider [facility] , who verbally acknowledged these results. Electronically Signed   By: Pedro Earls M.D.   On: 01/11/2021 14:07   DG Hip Unilat W or Wo Pelvis 2-3 Views Right  Result Date: 01/11/2021 CLINICAL DATA:  Status post fall down stairs 12/23/2020. Pelvic pain. Known sacral fractures. EXAM: DG HIP (WITH OR WITHOUT PELVIS) 2-3V RIGHT COMPARISON:  MRI lumbar spine today. FINDINGS: The patient's sacral fractures seen on MRI are difficult to visualize on this examination. No other fracture is identified. The hips are located. Joint spaces are  preserved. IMPRESSION: Sacral fracture seen on MRI are difficult to visualize on this exam. No acute bony abnormality is seen. Electronically Signed   By: Inge Rise M.D.   On: 01/11/2021 15:04    ____________________________________________   INITIAL IMPRESSION / ASSESSMENT AND PLAN / ED COURSE  As part of my medical decision making, I reviewed the following data within the [place] notes reviewed and incorporated, Discussed with radiologist, A consult was requested and obtained from this/these consultant(s) neurosurgery and orthopedics, Notes from prior ED visits, and [place] Controlled Substance Database        Patient is a 65 year old female who presents to the emergency department for evaluation of persistent low back pain and progressing right leg pain and weakness since fall 12 days ago.  See HPI for further details.  In triage patient had normal vital signs.  Physical exam as above, most concerning for decreased sensation to touch on the lateral aspect of the leg as well as inability to perform straight leg raise.  It is difficult to assess whether the motor function of straight leg raise is limited secondary to weakness or pain, however the patient endorses both with attempt.  She does have strong motor strength in the distal portion with ankle plantarflexion and dorsiflexion.  She is vascularly intact.  Given her persistent symptoms as well as concern for possible neurologic involvement, MRI of the lumbar spine was obtained.  I did receive a call from radiology regarding bilateral sacral insufficiency fractures that she has present, however no lumbar stenosis or cord compression was identified.  I discussed the findings with Dr. Rudene Christians, who recommended consulting neurosurgery regarding the questionable neurologic deficits.  Patient continues to report a need to urinate but inability to do so.  Preurination bladder scan was just greater than 500 mL, she was then  able to urinate 350 mL about 20 min later and repeat bladder scan immediately after urination revealed 674mL of residual volume. It is undetermined if this is due to initial inaccurate scan, however patient appears to be retaining.   Discussed the findings with Dr. Cari [name] of neurosurgery who reviewed the patient's MRI.  He states that he does  not have a clear explanation for her neurologic symptoms or urinary retention.  He advised obtaining a MRI of the thoracic spine to rule out any cord compression that is more superior than the lumbar spine.  He states that if there is no large cord compression in this region, patient likely to be candidate for further intervention with orthopedics at their discretion.  These findings were discussed with the patient and she is amenable to continued evaluation with additional MRI.  At this time, the patient is being signed out to oncoming provider Betha Loa, PA-C.  At this time, plan is to await thoracic MRI to determine need for neurosurgery versus orthopedic intervention.  I anticipate that the patient will likely need admission to the general hospital service for pain control, PT +/- pending any intervention with orthopedics.        ____________________________________________   FINAL CLINICAL IMPRESSION(S) / ED DIAGNOSES  Final diagnoses:  Acute midline low back pain with right-sided sciatica  Right leg pain     ED Discharge Orders     None        Note:  This document was prepared using Dragon voice recognition software and may include unintentional dictation errors.    Marlana Salvage, PA 01/11/21 1629    Lucrezia Starch, MD 01/11/21 1840

## 2021-01-11 NOTE — ED Triage Notes (Signed)
Presents s/p fall 2 weeks ago  states she fell down some steps  conts' to have pain with some numbness and  tingling to right leg  has been seen in ED and has had back adjustment    good pulses noted

## 2021-01-11 NOTE — H&P (Addendum)
History and Physical   Alisha Sanchez JGG:[id number] DOB: [birth year] DOA: 01/11/2021  PCP: [name], MD   Patient coming from: Home  Chief Complaint: Back pain, fall  HPI: Alisha Sanchez is a 65 y.o. female with medical history significant of chronic pain, diabetes, hypertension, GERD, history of PE, hyperlipidemia, migraine, hypothyroidism with a history of thyroid cancer, diverticulosis who presents with ongoing back pain following a fall. Patient had a fall around 2 weeks ago.  She was seen in the ED after this and initial imaging looked okay, on x-ray.  At the time of the fall she fell down around 10 steps.  She had significant pain all over however only the pain in her low back/sacrum has continued to worsen.  She is also had some numbness and tingling of her right lower extremity (closer to her R hip).  She is now been requiring a walker to walk which is new for her.  She also reports episodes of urinary incontinence.  She is taken Norco and tizanidine at home without improvement.  She also tried going to a chiropractor but could not tolerate sacral manipulation. She denies fevers, chills, chest pain, shortness of breath, abdominal pain, constipation, diarrhea, nausea, vomiting. Denies saddle anesthesia.  ED Course: Vital signs in the ED are stable.  Lab work-up including CBC and CMP still pending.  Respiratory panel also pending.  Urinalysis showed glucose and trace leukocytes.  Hip x-ray on the right showed no acute normality.  However MRI of the L-spine showed bilateral sacral insufficiency fractures and mild degenerative changes.  MR T-spine showed no acute abnormality.  Patient has been given pain medication in the ED.  Orthopedics was consulted with plan to operate tomorrow.  Due to her incontinence neurosurgery also consulted and recommended T-spine imaging however with this being normal do not suspect impingement just inflammatory reaction from fall and known  fracture.  Review of Systems: As per HPI otherwise all other systems reviewed and are negative.  Past Medical History:  Diagnosis Date   Arthritis    knees   Cancer ([place])    Thyroid   Complication of anesthesia    Depression    Diabetes mellitus type 2 in obese (HCC)    Dyspnea    GERD (gastroesophageal reflux disease)    Headache    migraines - 3-4x/yr   HLD (hyperlipidemia)    HTN (hypertension)    Hypothyroidism    no thyroid   Localized swelling of both lower legs    leaky veins   Lower extremity edema    Neck pain    left side   Numbness and tingling    numbness in hands and face tingles   PONV (postoperative nausea and vomiting)    Pulmonary embolus ([place])    after thyroid surgery   Vertigo    no episodes in approx 2 yrs    Past Surgical History:  Procedure Laterality Date   c-sections     x2    CATARACT EXTRACTION W/PHACO Left 08/27/2017   Procedure: CATARACT EXTRACTION PHACO AND INTRAOCULAR LENS PLACEMENT ([place]) LEFT DIABETIC;  Surgeon: Leandrew Koyanagi, MD;  Location: [facility];  Service: Ophthalmology;  Laterality: Left;  Diabetic - oral meds   COLONOSCOPY N/A 11/10/2015   Procedure: COLONOSCOPY;  Surgeon: Hulen Luster, MD;  Location: [facility];  Service: Gastroenterology;  Laterality: N/A;   ESOPHAGOGASTRODUODENOSCOPY N/A 11/10/2015   Procedure: ESOPHAGOGASTRODUODENOSCOPY (EGD) with dialation;  Surgeon: Hulen Luster, MD;  Location: [facility];  Service: Gastroenterology;  Laterality: N/A;   IVC FILTER INSERTION N/A 03/17/2019   Procedure: IVC FILTER INSERTION;  Surgeon: Algernon Huxley, MD;  Location: [facility];  Service: Cardiovascular;  Laterality: N/A;   IVC FILTER REMOVAL N/A 06/05/2019   Procedure: IVC FILTER REMOVAL;  Surgeon: Algernon Huxley, MD;  Location: [facility];  Service: Cardiovascular;  Laterality: N/A;   KNEE ARTHROPLASTY Left 03/24/2019   Procedure: COMPUTER ASSISTED TOTAL KNEE ARTHROPLASTY - RNFA;   Surgeon: Dereck Leep, MD;  Location: [facility];  Service: Orthopedics;  Laterality: Left;   SHOULDER ARTHROSCOPY Left 11/15/2018   Procedure: ARTHROSCOPY SHOULDER WITH DEBRIDEMENT DECOMPRESSION, ROTATOR CUFF REPAIR AND POSSIBLE BICEPS TENODESIS;  Surgeon: Corky Mull, MD;  Location: [facility];  Service: Orthopedics;  Laterality: Left;  SMITH AND NEPHEW REGENTEN PATCH   THYROIDECTOMY     Social History  reports that she has never smoked. She has never used smokeless tobacco. She reports current alcohol use of about 1.0 standard drink of alcohol per week. She reports that she does not use drugs.  No Known Allergies  Family History  Problem Relation Age of Onset   Liver cancer Father    Lung cancer Father    Diabetes Father    Stroke Other    Breast cancer Maternal Aunt 32   Breast cancer Paternal Aunt 57  Reviewed on admission  Prior to Admission medications   Medication Sig Start Date End Date Taking? Authorizing Provider  alendronate (FOSAMAX) 70 MG tablet Take 70 mg by mouth once a week. Take with a full glass of water on an empty stomach.    [provider]  amLODipine (NORVASC) 5 MG tablet Take 5 mg by mouth at bedtime.     [provider]  aspirin 81 MG EC tablet Take 1 tablet by mouth daily.    [provider]  enoxaparin (LOVENOX) 40 MG/0.4ML injection Inject 0.4 mLs (40 mg total) into the skin daily for 14 days. 03/27/19 04/10/19  Watt Climes, PA  glipiZIDE (GLUCOTROL XL) 2.5 MG 24 hr tablet Take 2.5 mg by mouth daily. 11/25/20   [provider]  hydrochlorothiazide (HYDRODIURIL) 25 MG tablet Take 25 mg by mouth daily.    [provider]  HYDROcodone-acetaminophen (NORCO/VICODIN) 5-325 MG tablet Take 1 tablet by mouth every 6 (six) hours as needed. 01/05/21   [provider]  ibuprofen (ADVIL) 200 MG tablet Take 400 mg by mouth every 8 (eight) hours as needed.    [provider]  Melatonin 10 MG TABS Take 1  tablet by mouth at bedtime as needed.    [provider]  Multiple Vitamins-Minerals (MULTIVITAMIN ADULT PO) Take 2 tablets by mouth daily.     [provider]  naproxen (NAPROSYN) 500 MG tablet Take 1 tablet (500 mg total) by mouth 2 (two) times daily with a meal. 12/23/20   Triplett, Cari B, FNP  pantoprazole (PROTONIX) 40 MG tablet Take 40 mg by mouth daily.    [provider]  rosuvastatin (CRESTOR) 10 MG tablet Take 10 mg by mouth at bedtime.     [provider]  sitaGLIPtin-metformin (JANUMET) 50-500 MG tablet Take 1 tablet by mouth 2 (two) times daily with a meal.    [provider]  SYNTHROID 100 MCG tablet Take 100 mcg by mouth daily. 01/10/21   [provider]  SYNTHROID 125 MCG tablet Take 125 mcg by mouth daily before breakfast.  04/20/18   [provider]  tiZANidine (ZANAFLEX) 4 MG tablet Take 1 tablet (4 mg total) by mouth 3 (three) times daily. 12/23/20   Victorino Dike, FNP    Physical Exam: Vitals:   01/11/21 0928 01/11/21 0934 01/11/21 1300  BP: 105/68  110/70  Pulse: 77  70  Resp: 16  16  Temp: 98.3 F (36.8 C)    TempSrc: Oral    SpO2: 99%  99%  Weight:  81.6 kg   Height:  5\' 7"  (1.702 m)    Physical Exam Constitutional:      General: She is not in acute distress.    Appearance: Normal appearance.  HENT:     Head: Normocephalic and atraumatic.     Mouth/Throat:     Mouth: Mucous membranes are moist.     Pharynx: Oropharynx is clear.  Eyes:     Extraocular Movements: Extraocular movements intact.     Pupils: Pupils are equal, round, and reactive to light.  Cardiovascular:     Rate and Rhythm: Normal rate and regular rhythm.     Pulses: Normal pulses.     Heart sounds: Normal heart sounds.  Pulmonary:     Effort: Pulmonary effort is normal. No respiratory distress.     Breath sounds: Normal breath sounds.  Abdominal:     General: Bowel sounds are normal. There is no distension.      Palpations: Abdomen is soft.     Tenderness: There is no abdominal tenderness.  Musculoskeletal:        General: No swelling or deformity.     Comments: Bilateral lower extremities neurovascularly intact  Skin:    General: Skin is warm and dry.  Neurological:     General: No focal deficit present.     Mental Status: Mental status is at baseline.     Comments: Some numbness in her upper right upper extremity.  Near hip   Labs on Admission: I have personally reviewed following labs and imaging studies  CBC: No results for input(s): WBC, NEUTROABS, HGB, HCT, MCV, PLT in the last 168 hours.  Basic Metabolic Panel: No results for input(s): NA, K, CL, CO2, GLUCOSE, BUN, CREATININE, CALCIUM, MG, PHOS in the last 168 hours.  GFR: Estimated Creatinine Clearance: 77 mL/min (by C-G formula based on SCr of 0.69 mg/dL).  Liver Function Tests: No results for input(s): AST, ALT, ALKPHOS, BILITOT, PROT, ALBUMIN in the last 168 hours.  Urine analysis:    Component Value Date/Time   COLORURINE YELLOW (A) 01/11/2021 1617   APPEARANCEUR CLEAR (A) 01/11/2021 1617   LABSPEC 1.013 01/11/2021 1617   PHURINE 7.0 01/11/2021 1617   GLUCOSEU 50 (A) 01/11/2021 1617   HGBUR NEGATIVE 01/11/2021 1617   BILIRUBINUR NEGATIVE 01/11/2021 1617   KETONESUR NEGATIVE 01/11/2021 1617   PROTEINUR NEGATIVE 01/11/2021 1617   UROBILINOGEN 0.2 07/22/2007 0917   NITRITE NEGATIVE 01/11/2021 1617   LEUKOCYTESUR TRACE (A) 01/11/2021 1617    Radiological Exams on Admission: MR THORACIC SPINE WO CONTRAST  Result Date: 01/11/2021 CLINICAL DATA:  Low back pain with worsening right leg pain and numbness since recent fall EXAM: MRI THORACIC SPINE WITHOUT CONTRAST TECHNIQUE: Multiplanar, multisequence MR imaging of the thoracic spine was performed. No intravenous contrast was administered. COMPARISON:  None. FINDINGS: Alignment:  No significant anteroposterior listhesis. Vertebrae: There is no compression deformity. Vertebral  body heights are maintained apart from mild degenerative endplate irregularity. There is no substantial marrow edema. No suspicious osseous lesion. Cord:  No  abnormal signal. Paraspinal and other soft tissues: Unremarkable. Disc levels: Mild degenerative disc disease with small disc bulges or protrusions. Mild facet arthropathy. No significant canal or foraminal stenosis at any level. IMPRESSION: No compression fracture. No abnormal cord signal. No significant stenosis. Electronically Signed   By: Macy Mis M.D.   On: 01/11/2021 17:28   MR LUMBAR SPINE WO CONTRAST  Result Date: 01/11/2021 CLINICAL DATA:  Low back pain.  Cauda equina syndrome suspected. EXAM: MRI LUMBAR SPINE WITHOUT CONTRAST TECHNIQUE: Multiplanar, multisequence MR imaging of the lumbar spine was performed. No intravenous contrast was administered. COMPARISON:  None. FINDINGS: Segmentation:  Standard. Alignment:  Physiologic. Vertebrae: Bilateral sacral fracture with prominent marrow edema, consistent with acute/subacute process. No evidence of discitis or aggressive bone lesion. Conus medullaris and cauda equina: Conus extends to the L1 level. Conus and cauda equina appear normal. Paraspinal and other soft tissues: Negative. Disc levels: T12-L1: No spinal canal or neural foraminal stenosis. L1-2: Shallow disc bulge. No spinal canal or neural foraminal stenosis. L2-3: Shallow disc bulge with left foraminal annular tear and mild facet degenerative changes. No significant spinal canal or neural foraminal stenosis. L3-4: Shallow disc bulge and hypertrophic facet degenerative changes without significant spinal canal or neural foraminal stenosis. L4-5: Mild facet degenerative changes. No spinal canal or neural foraminal stenosis. L5-S1: Shallow disc bulge, slightly asymmetric to the left and mild facet degenerative changes. No significant spinal canal or neural foraminal stenosis. IMPRESSION: 1. Findings consistent with bilateral sacral  insufficiency fracture. 2. Mild degenerative changes of the lumbar spine without significant spinal canal or neural foraminal stenosis. 3. 4. These results were called by telephone at the time of interpretation on 01/11/2021 at 2:07 pm to provider [facility] , who verbally acknowledged these results. Electronically Signed   By: Pedro Earls M.D.   On: 01/11/2021 14:07   DG Hip Unilat W or Wo Pelvis 2-3 Views Right  Result Date: 01/11/2021 CLINICAL DATA:  Status post fall down stairs 12/23/2020. Pelvic pain. Known sacral fractures. EXAM: DG HIP (WITH OR WITHOUT PELVIS) 2-3V RIGHT COMPARISON:  MRI lumbar spine today. FINDINGS: The patient's sacral fractures seen on MRI are difficult to visualize on this examination. No other fracture is identified. The hips are located. Joint spaces are preserved. IMPRESSION: Sacral fracture seen on MRI are difficult to visualize on this exam. No acute bony abnormality is seen. Electronically Signed   By: Inge Rise M.D.   On: 01/11/2021 15:04    EKG: Independently reviewed.  Sinus rhythm at 85 bpm apparent left arm and right arm electrode reversal.  Some nonspecific T wave changes consistent of flattening.  Assessment/Plan Principal Problem:   Sacral insufficiency fracture Active Problems:   Diabetes ([place])   Hyperlipidemia   Essential hypertension   History of pulmonary embolus (PE)   Diabetes mellitus type 2, uncomplicated (HCC)   GERD (gastroesophageal reflux disease)   Thyroid cancer (HCC)  Sacral insufficiency fracture > Had a fall down stairs couple weeks ago.  Other pain improving however low back and sacral pain worsening despite medication.  Now requiring a walker to assist with walking.  Episode urinary incontinence.  Also now with right lower extremity paresthesias. > Found to have sacral insufficiency fractures on MRI L-spine.  Ortho has been consulted and will see the patient with plan for surgery tomorrow. > Neurosurgery  also involved due to episodes of urinary continence recommend evaluation of T-spine as well neither MRI showed any impingement so this is suspected to  be likely due to inflammatory reaction from the fall/fracture. - Appreciate orthopedic recommendations - Continuous pulse ox - Up with assistance - Pain control as needed - Follow-up CBC and CMP  Diabetes - SSI  Hypertension - Blood pressure low normal in the ED in the 342A to 768T systolics.  - Hold home amlodipine and HCTZ for now given low normal blood pressures and will be receiving pain medication.  GERD - Continue home PPI  Hyperlipidemia - Continue home statin  Hypothyroidism in the setting of history of thyroid cancer - Continue home Synthroid  History of DVT in the setting of prior surgery and 2020.  Status post IVC filter and IVC filter removal. - Noted  DVT prophylaxis: SCDs Code Status:   Full  Family Communication:  Husband Updated at Bedside Disposition Plan:   Patient is from:  Home  Anticipated DC to:  Home  Anticipated DC date:  1 to 3 days  Anticipated DC barriers: None  Consults called:  Orthopedics, Dr. Rudene Christians, consulted by EDP.  Neurosurgery curb sided due to urinary incontinence as per above, does not appear that they will be doing a formal consult.  Admission status:  Observation, MedSurg with continuous pulse ox   Severity of Illness: The appropriate patient status for this patient is OBSERVATION. Observation status is judged to be reasonable and necessary in order to provide the required intensity of service to ensure the patient's safety. The patient's presenting symptoms, physical exam findings, and initial radiographic and laboratory data in the context of their medical condition is felt to place them at decreased risk for further clinical deterioration. Furthermore, it is anticipated that the patient will be medically stable for discharge from the hospital within 2 midnights of admission. The following  factors support the patient status of observation.   " The patient's presenting symptoms include low back pain, right lower extremity numbness and tingling. " The physical exam findings include sacral pain, mild RUE numbness. " The initial radiographic and laboratory data are Lab work-up including CBC and CMP still pending.  Respiratory panel also pending.  Urinalysis showed glucose and trace leukocytes.  Hip x-ray on the right showed no acute normality.  However MRI of the L-spine showed bilateral sacral insufficiency fractures and mild degenerative changes.  MR T-spine showed no acute abnormality.   Marcelyn Bruins MD [facility]  How to contact the [facility] Attending or Consulting provider [place] or covering provider during after hours [place], for this patient?   Check the care team in [facility] and look for a) attending/consulting [facility] provider listed and b) the [facility] team listed Log into [email or web address] and use [place]'s universal password to access. If you do not have the password, please contact the hospital operator. Locate the [facility] provider you are looking for under [facility] and page to a number that you can be directly reached. If you still have difficulty reaching the provider, please page the [facility] (Director on Call) for the Hospitalists listed on amion for assistance.  01/11/2021, 8:02 PM

## 2021-01-11 NOTE — ED Notes (Signed)
Ed rn unable to get labs called lab to send help

## 2021-01-11 NOTE — ED Provider Notes (Signed)
----------------------------------------- 3:50 PM on 01/11/2021 -----------------------------------------  Blood pressure 110/70, pulse 70, temperature 98.3 F (36.8 C), temperature source Oral, resp. rate 16, height 5\' 7"  (1.702 m), weight 81.6 kg, SpO2 99 %.  Assuming care from [facility], PA-C.  In short, Alisha Sanchez is a 65 y.o. female with a chief complaint of Leg Pain .  Refer to the original H&P for additional details.  The current plan of care is to perform MRI of the thoracic spine.  Patient presented to the emergency department with progressive neurodeficits from an injury that occurred on 12/23/2020.  Patient had work-up at that time which did not reveal any acute findings.  Patient has had progressive pain and increasing neuro deficits in the lower extremities.  She has seen both primary care as well as chiropractor with no improvement, in fact worsening symptoms.  Patient received further imaging today and found to have sacral insufficiency fractures.  When talking with orthopedics, they recommend that they can perform sacroplasty however given the neuro symptoms they recommended talking with neurosurgery.  Neurosurgery was consulted and given the reassuring lumbar spine MRI with no explanation for the patient's her symptoms neurosurgery recommends performing MRI of the thoracic spine.  At this time I placed orders for MRI of the T-spine and we will await these results and follow-up with neurosurgery and orthopedics as needed.  ----------------------------------------- 8:01 PM on 01/11/2021 -----------------------------------------   MRI of the thoracic spine reveals no concerning findings.  At this time neurosurgery had recommended that there was no concerning findings patient would be suitable for sacroplasty from a neurosurgery standpoint.  I discussed the patient again with Dr. Rudene Christians who reviewed the patient's imaging.  He feels that given the patient's increasing and  worsening symptoms that she would be a candidate for sacroplasty.  At this time patient will be admitted to the hospital service pending surgical intervention.  Hospitalist service is agreeable to accepting the patient at this time.   MRI LUMBAR SPINE WITHOUT CONTRAST   TECHNIQUE: Multiplanar, multisequence MR imaging of the lumbar spine was performed. No intravenous contrast was administered.   COMPARISON:  None.   FINDINGS: Segmentation:  Standard.   Alignment:  Physiologic.   Vertebrae: Bilateral sacral fracture with prominent marrow edema, consistent with acute/subacute process. No evidence of discitis or aggressive bone lesion.   Conus medullaris and cauda equina: Conus extends to the L1 level. Conus and cauda equina appear normal.   Paraspinal and other soft tissues: Negative.   Disc levels:   T12-L1: No spinal canal or neural foraminal stenosis.   L1-2: Shallow disc bulge. No spinal canal or neural foraminal stenosis.   L2-3: Shallow disc bulge with left foraminal annular tear and mild facet degenerative changes. No significant spinal canal or neural foraminal stenosis.   L3-4: Shallow disc bulge and hypertrophic facet degenerative changes without significant spinal canal or neural foraminal stenosis.   L4-5: Mild facet degenerative changes. No spinal canal or neural foraminal stenosis.   L5-S1: Shallow disc bulge, slightly asymmetric to the left and mild facet degenerative changes. No significant spinal canal or neural foraminal stenosis.   IMPRESSION: 1. 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 2. Mild degenerative changes of the lumbar spine without significant spinal canal or neural foraminal stenosis. 3. 4. These results were called by telephone at the time of interpretation on 01/11/2021 at 2:07 pm to provider [facility] , who verbally acknowledged these results.    MRI THORACIC SPINE WITHOUT CONTRAST  TECHNIQUE: Multiplanar, multisequence MR imaging of the thoracic spine was performed. No intravenous contrast was administered.   COMPARISON:  None.   FINDINGS: Alignment:  No significant anteroposterior listhesis.   Vertebrae: There is no compression deformity. Vertebral body heights are maintained apart from mild degenerative endplate irregularity. There is no substantial marrow edema. No suspicious osseous lesion.   Cord:  No abnormal signal.   Paraspinal and other soft tissues: Unremarkable.   Disc levels: Mild degenerative disc disease with small disc bulges or protrusions. Mild facet arthropathy. No significant canal or foraminal stenosis at any level.   IMPRESSION: No compression fracture. No abnormal cord signal. No significant stenosis.    ED diagnosis:  Sacral insufficiency fracture Fall, subsequent encounter    Brynda Peon 01/11/21 Jon Billings, MD 01/13/21 [id number]

## 2021-01-12 ENCOUNTER — Encounter: Payer: Self-pay | Admitting: Internal Medicine

## 2021-01-12 ENCOUNTER — Observation Stay: Payer: Medicare PPO | Admitting: Anesthesiology

## 2021-01-12 ENCOUNTER — Observation Stay: Payer: Medicare PPO

## 2021-01-12 ENCOUNTER — Encounter
Admission: EM | Disposition: A | Discharge status: Home / Self Care | Payer: Self-pay | Source: Home / Self Care | Attending: Emergency Medicine

## 2021-01-12 DIAGNOSIS — E119 Type 2 diabetes mellitus without complications: Secondary | ICD-10-CM

## 2021-01-12 DIAGNOSIS — M8448XD Pathological fracture, other site, subsequent encounter for fracture with routine healing: Secondary | ICD-10-CM | POA: Diagnosis not present

## 2021-01-12 DIAGNOSIS — K219 Gastro-esophageal reflux disease without esophagitis: Secondary | ICD-10-CM

## 2021-01-12 DIAGNOSIS — I10 Essential (primary) hypertension: Secondary | ICD-10-CM | POA: Diagnosis not present

## 2021-01-12 HISTORY — PX: SACROPLASTY: SHX6797

## 2021-01-12 LAB — COMPREHENSIVE METABOLIC PANEL
ALT: 10 U/L (ref 0–44)
AST: 14 U/L — ABNORMAL LOW (ref 15–41)
Albumin: 4 g/dL (ref 3.5–5.0)
Alkaline Phosphatase: 94 U/L (ref 38–126)
Anion gap: 10 (ref 5–15)
BUN: 19 mg/dL (ref 8–23)
CO2: 26 mmol/L (ref 22–32)
Calcium: 9.1 mg/dL (ref 8.9–10.3)
Chloride: 104 mmol/L (ref 98–111)
Creatinine, Ser: 0.54 mg/dL (ref 0.44–1.00)
GFR, Estimated: >60 mL/min (ref >60)
Glucose, Bld: 114 mg/dL — ABNORMAL HIGH (ref 70–99)
Potassium: 3.5 mmol/L (ref 3.5–5.1)
Sodium: 140 mmol/L (ref 135–145)
Total Bilirubin: 1.2 mg/dL (ref 0.3–1.2)
Total Protein: 7.6 g/dL (ref 6.5–8.1)

## 2021-01-12 LAB — CBC
HCT: 36 % (ref 36.0–46.0)
Hemoglobin: 11.9 g/dL — ABNORMAL LOW (ref 12.0–15.0)
MCH: 27.9 pg (ref 26.0–34.0)
MCHC: 33.1 g/dL (ref 30.0–36.0)
MCV: 84.3 fL (ref 80.0–100.0)
Platelets: 351 10*3/uL (ref 150–400)
RBC: 4.27 MIL/uL (ref 3.87–5.11)
RDW: 14.4 % (ref 11.5–15.5)
WBC: 6 10*3/uL (ref 4.0–10.5)
nRBC: 0 % (ref 0.0–0.2)

## 2021-01-12 LAB — HEMOGLOBIN A1C
Hgb A1c MFr Bld: 6.5 % — ABNORMAL HIGH (ref 4.8–5.6)
Mean Plasma Glucose: 139.85 mg/dL

## 2021-01-12 LAB — GLUCOSE, CAPILLARY
Glucose-Capillary: 166 mg/dL — ABNORMAL HIGH (ref 70–99)
Glucose-Capillary: 160 mg/dL — ABNORMAL HIGH (ref 70–99)
Glucose-Capillary: 137 mg/dL — ABNORMAL HIGH (ref 70–99)
Glucose-Capillary: 121 mg/dL — ABNORMAL HIGH (ref 70–99)
Glucose-Capillary: 113 mg/dL — ABNORMAL HIGH (ref 70–99)
Glucose-Capillary: 124 mg/dL — ABNORMAL HIGH (ref 70–99)

## 2021-01-12 LAB — TYPE AND SCREEN
ABO/RH(D): A POS
Antibody Screen: NEGATIVE

## 2021-01-12 LAB — HIV ANTIBODY (ROUTINE TESTING W REFLEX): HIV Screen 4th Generation wRfx: NONREACTIVE

## 2021-01-12 SURGERY — SACROPLASTY
Anesthesia: General

## 2021-01-12 MED ORDER — CEFAZOLIN SODIUM-DEXTROSE 2-4 GM/100ML-% IV SOLN
INTRAVENOUS | Status: AC
  Filled 2021-01-12: qty 100

## 2021-01-12 MED ORDER — MIDAZOLAM HCL 2 MG/2ML IJ SOLN
INTRAMUSCULAR | Status: DC | PRN
  Administered 2021-01-12: 2 mg via INTRAVENOUS

## 2021-01-12 MED ORDER — DEXMEDETOMIDINE (PRECEDEX) IN NS 20 MCG/5ML (4 MCG/ML) IV SYRINGE
PREFILLED_SYRINGE | INTRAVENOUS | Status: AC
  Filled 2021-01-12: qty 5

## 2021-01-12 MED ORDER — PHENYLEPHRINE HCL (PRESSORS) 10 MG/ML IV SOLN
INTRAVENOUS | Status: AC
  Filled 2021-01-12: qty 1

## 2021-01-12 MED ORDER — CEFAZOLIN SODIUM-DEXTROSE 2-4 GM/100ML-% IV SOLN
2.0000 g | Freq: Once | INTRAVENOUS | Status: AC
  Administered 2021-01-12: 2 g via INTRAVENOUS

## 2021-01-12 MED ORDER — PROPOFOL 500 MG/50ML IV EMUL
INTRAVENOUS | Status: DC | PRN
  Administered 2021-01-12: 150 ug/kg/min via INTRAVENOUS

## 2021-01-12 MED ORDER — FENTANYL CITRATE (PF) 100 MCG/2ML IJ SOLN
INTRAMUSCULAR | Status: DC | PRN
  Administered 2021-01-12: 50 ug via INTRAVENOUS

## 2021-01-12 MED ORDER — KETAMINE HCL 10 MG/ML IJ SOLN
INTRAMUSCULAR | Status: DC | PRN
  Administered 2021-01-12: 10 mg via INTRAVENOUS

## 2021-01-12 MED ORDER — DEXMEDETOMIDINE (PRECEDEX) IN NS 20 MCG/5ML (4 MCG/ML) IV SYRINGE
PREFILLED_SYRINGE | INTRAVENOUS | Status: DC | PRN
Start: 2021-01-12 — End: 2021-01-12
  Administered 2021-01-12: 8 ug via INTRAVENOUS

## 2021-01-12 MED ORDER — ACETAMINOPHEN 10 MG/ML IV SOLN
INTRAVENOUS | Status: AC
  Filled 2021-01-12: qty 100

## 2021-01-12 MED ORDER — ACETAMINOPHEN 10 MG/ML IV SOLN
INTRAVENOUS | Status: DC | PRN
  Administered 2021-01-12: 1000 mg via INTRAVENOUS

## 2021-01-12 MED ORDER — KETAMINE HCL 50 MG/5ML IJ SOSY
PREFILLED_SYRINGE | INTRAMUSCULAR | Status: AC
  Filled 2021-01-12: qty 5

## 2021-01-12 MED ORDER — PROPOFOL 500 MG/50ML IV EMUL
INTRAVENOUS | Status: AC
  Filled 2021-01-12: qty 50

## 2021-01-12 MED ORDER — GLYCOPYRROLATE 0.2 MG/ML IJ SOLN
INTRAMUSCULAR | Status: DC | PRN
  Administered 2021-01-12: .2 mg via INTRAVENOUS

## 2021-01-12 MED ORDER — PHENYLEPHRINE HCL (PRESSORS) 10 MG/ML IV SOLN
INTRAVENOUS | Status: DC | PRN
  Administered 2021-01-12: 50 ug via INTRAVENOUS
  Administered 2021-01-12: 100 ug via INTRAVENOUS

## 2021-01-12 MED ORDER — MIDAZOLAM HCL 2 MG/2ML IJ SOLN
INTRAMUSCULAR | Status: AC
  Filled 2021-01-12: qty 2

## 2021-01-12 MED ORDER — LACTATED RINGERS IV SOLN: INTRAVENOUS | Status: DC | PRN

## 2021-01-12 MED ORDER — FENTANYL CITRATE (PF) 100 MCG/2ML IJ SOLN
INTRAMUSCULAR | Status: AC
  Filled 2021-01-12: qty 2

## 2021-01-12 MED ORDER — PROPOFOL 10 MG/ML IV BOLUS
INTRAVENOUS | Status: AC
  Filled 2021-01-12: qty 20

## 2021-01-12 MED ORDER — LEVOTHYROXINE SODIUM 50 MCG PO TABS
100.0000 ug | ORAL_TABLET | Freq: Every day | ORAL | Status: DC
  Administered 2021-01-13 – 2021-01-14 (×2): 100 ug via ORAL
  Filled 2021-01-12: qty 2
  Filled 2021-01-12: qty 1
  Filled 2021-01-12: qty 2

## 2021-01-12 MED ORDER — ONDANSETRON HCL 4 MG/2ML IJ SOLN
INTRAMUSCULAR | Status: DC | PRN
  Administered 2021-01-12: 4 mg via INTRAVENOUS

## 2021-01-12 SURGICAL SUPPLY — 20 items
CEMENT KYPHON CX01A KIT/MIXER (Cement) ×2 IMPLANT
DERMABOND ADVANCED (GAUZE/BANDAGES/DRESSINGS) ×1
DERMABOND ADVANCED .7 DNX12 (GAUZE/BANDAGES/DRESSINGS) ×1 IMPLANT
DRAPE C-ARM XRAY 36X54 (DRAPES) ×2 IMPLANT
DURAPREP 26ML APPLICATOR (WOUND CARE) ×2 IMPLANT
GAUZE 4X4 16PLY ~~LOC~~+RFID DBL (SPONGE) ×2 IMPLANT
GLOVE SURG SYN 9.0  PF PI (GLOVE) ×1
GLOVE SURG SYN 9.0 PF PI (GLOVE) ×1 IMPLANT
GOWN SRG 2XL LVL 4 RGLN SLV (GOWNS) ×1 IMPLANT
GOWN STRL NON-REIN 2XL LVL4 (GOWNS) ×1
GOWN STRL REUS W/ TWL LRG LVL3 (GOWN DISPOSABLE) ×1 IMPLANT
GOWN STRL REUS W/TWL LRG LVL3 (GOWN DISPOSABLE) ×1
KIT OSTEOCOOL BONE ACCESS 10G (MISCELLANEOUS) ×4 IMPLANT
KIT TURNOVER KIT A (KITS) ×2 IMPLANT
MANIFOLD NEPTUNE II (INSTRUMENTS) ×2 IMPLANT
PACK KYPHOPLASTY (MISCELLANEOUS) ×2 IMPLANT
SWABSTK COMLB BENZOIN TINCTURE (MISCELLANEOUS) ×2 IMPLANT
SYS CARTRIDGE BONE CEMENT 8ML (SYSTAGENIX WOUND MANAGEMENT) ×2
SYSTEM CARTRIDG BONE CEMNT 8ML (SYSTAGENIX WOUND MANAGEMENT) ×1 IMPLANT
SYSTEM GUN BONE FILLER SZ2 (MISCELLANEOUS) ×2 IMPLANT

## 2021-01-12 NOTE — Anesthesia Preprocedure Evaluation (Signed)
Anesthesia Evaluation  Patient identified by MRN, date of birth, ID band Patient awake    Reviewed: Allergy & Precautions, NPO status , Patient's Chart, lab work & pertinent test results  History of Anesthesia Complications (+) PONV and history of anesthetic complications  Airway Mallampati: III  TM Distance: >3 FB Neck ROM: full    Dental  (+) Chipped, Poor Dentition   Pulmonary neg pulmonary ROS, neg shortness of breath,    Pulmonary exam normal        Cardiovascular Exercise Tolerance: Good hypertension, Normal cardiovascular exam     Neuro/Psych  Headaches, PSYCHIATRIC DISORDERS negative psych ROS   GI/Hepatic Neg liver ROS, GERD  Medicated and Controlled,  Endo/Other  diabetes, Type 2Hypothyroidism   Renal/GU negative Renal ROS  negative genitourinary   Musculoskeletal  (+) Arthritis ,   Abdominal   Peds  Hematology negative hematology ROS (+)   Anesthesia Other Findings Past Medical History: No date: Arthritis     Comment:  knees No date: Cancer ([place])     Comment:  Thyroid No date: Complication of anesthesia No date: Depression No date: Diabetes mellitus type 2 in obese (HCC) No date: Dyspnea No date: GERD (gastroesophageal reflux disease) No date: Headache     Comment:  migraines - 3-4x/yr No date: HLD (hyperlipidemia) No date: HTN (hypertension) No date: Hypothyroidism     Comment:  no thyroid No date: Localized swelling of both lower legs     Comment:  leaky veins No date: Lower extremity edema No date: Neck pain     Comment:  left side No date: Numbness and tingling     Comment:  numbness in hands and face tingles No date: PONV (postoperative nausea and vomiting) No date: Pulmonary embolus (HCC)     Comment:  after thyroid surgery No date: Vertigo     Comment:  no episodes in approx 2 yrs  Past Surgical History: No date: c-sections     Comment:  x2  08/27/2017: CATARACT EXTRACTION  W/PHACO; Left     Comment:  Procedure: CATARACT EXTRACTION PHACO AND INTRAOCULAR               LENS PLACEMENT ([place]) LEFT DIABETIC;  Surgeon: Leandrew Koyanagi, MD;  Location: [place];  Service:               Ophthalmology;  Laterality: Left;  Diabetic - oral meds 11/10/2015: COLONOSCOPY; N/A     Comment:  Procedure: COLONOSCOPY;  Surgeon: Hulen Luster, MD;                Location: [place];  Service:               Gastroenterology;  Laterality: N/A; 11/10/2015: ESOPHAGOGASTRODUODENOSCOPY; N/A     Comment:  Procedure: ESOPHAGOGASTRODUODENOSCOPY (EGD) with               dialation;  Surgeon: Hulen Luster, MD;  Location: [place];  Service: Gastroenterology;  Laterality:               N/A; 03/17/2019: IVC FILTER INSERTION; N/A     Comment:  Procedure: IVC FILTER INSERTION;  Surgeon: Algernon Huxley,              MD;  Location: [place] [facility];  Service:  Cardiovascular;  Laterality: N/A; 06/05/2019: IVC FILTER REMOVAL; N/A     Comment:  Procedure: IVC FILTER REMOVAL;  Surgeon: Algernon Huxley,               MD;  Location: [place] [facility];  Service:               Cardiovascular;  Laterality: N/A; 03/24/2019: KNEE ARTHROPLASTY; Left     Comment:  Procedure: COMPUTER ASSISTED TOTAL KNEE ARTHROPLASTY -               RNFA;  Surgeon: Dereck Leep, MD;  Location: [facility];              Service: Orthopedics;  Laterality: Left; 11/15/2018: SHOULDER ARTHROSCOPY; Left     Comment:  Procedure: ARTHROSCOPY SHOULDER WITH DEBRIDEMENT               DECOMPRESSION, ROTATOR CUFF REPAIR AND POSSIBLE BICEPS               TENODESIS;  Surgeon: Corky Mull, MD;  Location: [place];  Service: Orthopedics;  Laterality: Left;                SMITH AND NEPHEW REGENTEN PATCH No date: THYROIDECTOMY  BMI    Body Mass Index: 28.19 kg/m      Reproductive/Obstetrics negative OB ROS                              Anesthesia Physical Anesthesia Plan  ASA: 2  Anesthesia Plan: General   Post-op Pain Management:    Induction: Intravenous  PONV Risk Score and Plan: Propofol infusion and TIVA  Airway Management Planned: Natural Airway and Nasal Cannula  Additional Equipment:   Intra-op Plan:   Post-operative Plan:   Informed Consent: I have reviewed the patients History and Physical, chart, labs and discussed the procedure including the risks, benefits and alternatives for the proposed anesthesia with the patient or authorized representative who has indicated his/her understanding and acceptance.     Dental Advisory Given  Plan Discussed with: Anesthesiologist, CRNA and Surgeon  Anesthesia Plan Comments: (Patient consented for risks of anesthesia including but not limited to:  - adverse reactions to medications - risk of airway placement if required - damage to eyes, teeth, lips or other oral mucosa - nerve damage due to positioning  - sore throat or hoarseness - Damage to heart, brain, nerves, lungs, other parts of body or loss of life  Patient voiced understanding.)        Anesthesia Quick Evaluation

## 2021-01-12 NOTE — Op Note (Signed)
01/12/2021  12:33 PM  PATIENT:  Alisha Sanchez  65 y.o. female  PRE-OPERATIVE DIAGNOSIS:  Sacral insufficiency fracture  POST-OPERATIVE DIAGNOSIS:  Sacral insufficiency fracture  PROCEDURE:  Procedure(s): SACROPLASTY (N/A)  SURGEON: Laurene Footman, MD  ASSISTANTS: none  ANESTHESIA:   local and IV sedation  EBL:  Total I/O In: 300 [I.V.:300] Out: 500 [Urine:500]  BLOOD ADMINISTERED:none  DRAINS: none   LOCAL MEDICATIONS USED:  MARCAINE    and XYLOCAINE   SPECIMEN:  No Specimen  DISPOSITION OF SPECIMEN:  N/A  COUNTS:  YES  TOURNIQUET:  * No tourniquets in log *  IMPLANTS: Bone cement  DICTATION: .Dragon Dictation the patient was brought to the operating room and after adequate sedation was obtained she was placed prone.  C arm was brought in in both AP and lateral projections and good visualization of the sacrum was obtained.  After prepping the skin with alcohol and appropriate patient identification timeout procedures were completed 10 cc of 1% Xylocaine was infiltrated in the area of the planned incisions.  The back was then prepped and draped in the usual sterile fashion and a spinal needle was then utilized to get local down to the sacrum with a 50-50 mix of 1% Xylocaine half percent Sensorcaine with epinephrine 10 cc of each.  Small incisions were made and trochars advanced between the neuroforamen and the SI joint.  Cement was mixed and was appropriate consistency approximately 4 cc infiltrated into the sacrum S1 and S2 on each side without extravasation on the right small amount may be have extravasated superiorly on the left.  After this had set and trochars removed permanent C arm was were obtained showing good fill.  Dermabond was used to close the skin incision followed by Band-Aids.  Patient sent to recovery in stable condition  PLAN OF CARE: Admit to inpatient   PATIENT DISPOSITION:  PACU - hemodynamically stable.

## 2021-01-12 NOTE — Plan of Care (Signed)
Patient alert and oriented x 4, max assist. Admitted d/t fall resulting in a sacral fracture. Pain medication given. Will be NPO for possible surgery in the morning. No concerns. Will continue with the plan of care Problem: Education: Goal: Knowledge of General Education information will improve Description: Including pain rating scale, medication(s)/side effects and non-pharmacologic comfort measures Outcome: Progressing   Problem: Health Behavior/Discharge Planning: Goal: Ability to manage health-related needs will improve Outcome: Progressing   Problem: Clinical Measurements: Goal: Ability to maintain clinical measurements within normal limits will improve Outcome: Progressing Goal: Will remain free from infection Outcome: Progressing Goal: Diagnostic test results will improve Outcome: Progressing Goal: Respiratory complications will improve Outcome: Progressing Goal: Cardiovascular complication will be avoided Outcome: Progressing   Problem: Activity: Goal: Risk for activity intolerance will decrease Outcome: Progressing   Problem: Nutrition: Goal: Adequate nutrition will be maintained Outcome: Progressing   Problem: Coping: Goal: Level of anxiety will decrease Outcome: Progressing   Problem: Elimination: Goal: Will not experience complications related to bowel motility Outcome: Progressing Goal: Will not experience complications related to urinary retention Outcome: Progressing   Problem: Pain Managment: Goal: General experience of comfort will improve Outcome: Progressing   Problem: Safety: Goal: Ability to remain free from injury will improve Outcome: Progressing   Problem: Skin Integrity: Goal: Risk for impaired skin integrity will decrease Outcome: Progressing

## 2021-01-12 NOTE — Anesthesia Postprocedure Evaluation (Signed)
Anesthesia Post Note  Patient: Alisha Sanchez  Procedure(s) Performed: SACROPLASTY  Patient location during evaluation: PACU Anesthesia Type: General Level of consciousness: awake and alert Pain management: pain level controlled Vital Signs Assessment: post-procedure vital signs reviewed and stable Respiratory status: spontaneous breathing, nonlabored ventilation, respiratory function stable and patient connected to nasal cannula oxygen Cardiovascular status: blood pressure returned to baseline and stable Postop Assessment: no apparent nausea or vomiting Anesthetic complications: no   No notable events documented.   Last Vitals:  Vitals:   01/12/21 1253 01/12/21 1309  BP: 102/62 105/60  Pulse: 65 65  Resp:  16  Temp: (!) 36.3 C 36.4 C  SpO2: 95% 99%    Last Pain:  Vitals:   01/12/21 1253  TempSrc:   PainSc: 0-No pain                 Precious Haws Rodriguez Aguinaldo

## 2021-01-12 NOTE — Plan of Care (Signed)

## 2021-01-12 NOTE — Transfer of Care (Signed)
Immediate Anesthesia Transfer of Care Note  Patient: Alisha Sanchez  Procedure(s) Performed: SACROPLASTY  Patient Location: PACU  Anesthesia Type:General  Level of Consciousness: awake  Airway & Oxygen Therapy: Patient Spontanous Breathing  Post-op Assessment: Report given to RN and Post -op Vital signs reviewed and stable  Post vital signs: Reviewed and stable  Last Vitals:  Vitals Value Taken Time  BP 95/72 01/12/21 1234  Temp    Pulse 65 01/12/21 1235  Resp    SpO2 97 % 01/12/21 1235  Vitals shown include unvalidated device data.  Last Pain:  Vitals:   01/12/21 1111  TempSrc: Oral  PainSc: 0-No pain         Complications: No notable events documented.

## 2021-01-12 NOTE — Progress Notes (Signed)
PROGRESS NOTE    KYMBERLIE Sanchez  ACZ:[id number] DOB: 11/16/1955 DOA: 01/11/2021 PCP: [name], MD    Chief Complaint  Patient presents with   Leg Pain    Brief Narrative:  Alisha Sanchez is a 65 y.o. female with medical history significant of chronic pain, diabetes, hypertension, GERD, history of PE, hyperlipidemia, migraine, hypothyroidism with a history of thyroid cancer, diverticulosis who presents with ongoing back pain following a fall. However MRI of the L-spine showed bilateral sacral insufficiency fractures and mild degenerative changes.  Orthopedics consulted and patient underwent sacroplasty on 01/12/2021.   Assessment & Plan:   Principal Problem:   Sacral insufficiency fracture Active Problems:   Diabetes ([name])   Hyperlipidemia   Essential hypertension   History of pulmonary embolus (PE)   Diabetes mellitus type 2, uncomplicated (HCC)   GERD (gastroesophageal reflux disease)   Thyroid cancer ([name])   Sacral insufficiency fracture S/p sacroplasty by orthopedics Pain control Physical therapy evaluations.    Hypertension Blood pressure parameters appear to be optimal at this time.    Type 2 diabetes mellitus Non-insulin-dependent CBG (last 3)  Recent Labs    01/12/21 1115 01/12/21 1236 01/12/21 1413  GLUCAP 160* 137* 121*   On sliding scale insulin while inpatient.    Hypothyroidism Continue with Synthroid.    Hyperlipidemia Continue with statin.   GERD Continue with PPI   DVT prophylaxis: Restart heparin tomorrow Code Status: (Full code Family Communication: None at bedside Disposition:   Status is: Observation  The patient will require care spanning > 2 midnights and should be moved to inpatient because: Unsafe d/c plan  Dispo: The patient is from: Home              Anticipated d/c is to:  pending              Patient currently is not medically stable to d/c.   Difficult to place patient No        Consultants:  Orthopedics.   Procedures:sacroplasty on 01/12/21 Antimicrobials: none.    Subjective: Pain not wellcontrolled.   Objective: Vitals:   01/12/21 1235 01/12/21 1245 01/12/21 1253 01/12/21 1309  BP: 95/72 98/65 102/62 105/60  Pulse: 66 67 65 65  Resp:    16  Temp: (!) 97.2 F (36.2 C)  (!) 97.4 F (36.3 C) 97.6 F (36.4 C)  TempSrc:      SpO2: 96% 96% 95% 99%  Weight:      Height:        Intake/Output Summary (Last 24 hours) at 01/12/2021 1426 Last data filed at 01/12/2021 1222 Gross per 24 hour  Intake 300 ml  Output 850 ml  Net -550 ml   Filed Weights   01/11/21 0934  Weight: 81.6 kg    Examination:  General exam: Appears calm and comfortable  Respiratory system: Clear to auscultation. Respiratory effort normal. Cardiovascular system: S1 & S2 heard, RRR. No JVD, No pedal edema. Gastrointestinal system: Abdomen is nondistended, soft and nontender.. Normal bowel sounds heard. Central nervous system: Alert and oriented. No focal neurological deficits. Extremities: painful range of movements of the hip and lower extremities.  Skin: No rashes, lesions or ulcers Psychiatry:  Mood & affect appropriate.     Data Reviewed: I have personally reviewed following labs and imaging studies  CBC: Recent Labs  Lab 01/11/21 2014 01/12/21 0536  WBC 7.6 6.0  NEUTROABS 5.5  --   HGB 13.5 11.9*  HCT 40.4 36.0  MCV 84.7 84.3  PLT 265 267    Basic Metabolic Panel: Recent Labs  Lab 01/11/21 2014 01/12/21 0536  NA 139 140  K 4.0 3.5  CL 104 104  CO2 25 26  GLUCOSE 100* 114*  BUN 18 19  CREATININE 0.55 0.54  CALCIUM 9.3 9.1    GFR: Estimated Creatinine Clearance: 77 mL/min (by C-G formula based on SCr of 0.54 mg/dL).  Liver Function Tests: Recent Labs  Lab 01/11/21 2014 01/12/21 0536  AST 24 14*  ALT 13 10  ALKPHOS 96 94  BILITOT 1.5* 1.2  PROT 8.2* 7.6  ALBUMIN 4.2 4.0    CBG: Recent Labs  Lab 01/12/21 0854 01/12/21 1115  01/12/21 1236 01/12/21 1413  GLUCAP 166* 160* 137* 121*     Recent Results (from the past 240 hour(s))  Resp Panel by RT-PCR (Flu A&B, Covid) Nasopharyngeal Swab     Status: None   Collection Time: 01/11/21  7:30 PM   Specimen: Nasopharyngeal Swab; Nasopharyngeal(NP) swabs in vial transport medium  Result Value Ref Range Status   SARS Coronavirus 2 by RT PCR NEGATIVE NEGATIVE Final    Comment: (NOTE) SARS-CoV-2 target nucleic acids are NOT DETECTED.  The SARS-CoV-2 RNA is generally detectable in upper respiratory specimens during the acute phase of infection. The lowest concentration of SARS-CoV-2 viral copies this assay can detect is 138 copies/mL. A negative result does not preclude SARS-Cov-2 infection and should not be used as the sole basis for treatment or other patient management decisions. A negative result may occur with  improper specimen collection/handling, submission of specimen other than nasopharyngeal swab, presence of viral mutation(s) within the areas targeted by this assay, and inadequate number of viral copies(<138 copies/mL). A negative result must be combined with clinical observations, patient history, and epidemiological information. The expected result is Negative.  Fact Sheet for Patients:  EntrepreneurPulse.com.au  Fact Sheet for Healthcare Providers:  IncredibleEmployment.be  This test is no t yet approved or cleared by the [place] FDA and  has been authorized for detection and/or diagnosis of SARS-CoV-2 by FDA under an Emergency Use Authorization (EUA). This EUA will remain  in effect (meaning this test can be used) for the duration of the COVID-19 declaration under Section 564(b)(1) of the Act, 21 U.S.C.section 360bbb-3(b)(1), unless the authorization is terminated  or revoked sooner.       Influenza A by PCR NEGATIVE NEGATIVE Final   Influenza B by PCR NEGATIVE NEGATIVE Final    Comment: (NOTE) The  Xpert Xpress SARS-CoV-2/FLU/RSV plus assay is intended as an aid in the diagnosis of influenza from Nasopharyngeal swab specimens and should not be used as a sole basis for treatment. Nasal washings and aspirates are unacceptable for Xpert Xpress SARS-CoV-2/FLU/RSV testing.  Fact Sheet for Patients: EntrepreneurPulse.com.au  Fact Sheet for Healthcare Providers: IncredibleEmployment.be  This test is not yet approved or cleared by the [place] FDA and has been authorized for detection and/or diagnosis of SARS-CoV-2 by FDA under an Emergency Use Authorization (EUA). This EUA will remain in effect (meaning this test can be used) for the duration of the COVID-19 declaration under Section 564(b)(1) of the Act, 21 U.S.C. section 360bbb-3(b)(1), unless the authorization is terminated or revoked.  Performed at [facility], [address], [place], [place] [address]          Radiology Studies: DG Lumbar Spine 2-3 Views  Result Date: 01/12/2021 CLINICAL DATA:  Sacroplasty EXAM: LUMBAR SPINE - 2-3 VIEW; DG C-ARM 1-60 MIN COMPARISON:  01/12/2021 FLUOROSCOPY TIME:  1 minutes 7 seconds Dose: 35.716 mGy Images: 3 FINDINGS: Lateral images only. Osseous demineralization. Images demonstrate injection of cement into the S1 and S2 segments of the sacrum. Some of the cement extends beyond osseous margins dorsally and overlying the posterior aspect of the L5-S1 disc space. IMPRESSION: Post sacroplasty as above. Electronically Signed   By: Lavonia Dana M.D.   On: 01/12/2021 12:59   MR THORACIC SPINE WO CONTRAST  Result Date: 01/11/2021 CLINICAL DATA:  Low back pain with worsening right leg pain and numbness since recent fall EXAM: MRI THORACIC SPINE WITHOUT CONTRAST TECHNIQUE: Multiplanar, multisequence MR imaging of the thoracic spine was performed. No intravenous contrast was administered. COMPARISON:  None. FINDINGS: Alignment:  No significant  anteroposterior listhesis. Vertebrae: There is no compression deformity. Vertebral body heights are maintained apart from mild degenerative endplate irregularity. There is no substantial marrow edema. No suspicious osseous lesion. Cord:  No abnormal signal. Paraspinal and other soft tissues: Unremarkable. Disc levels: Mild degenerative disc disease with small disc bulges or protrusions. Mild facet arthropathy. No significant canal or foraminal stenosis at any level. IMPRESSION: No compression fracture. No abnormal cord signal. No significant stenosis. Electronically Signed   By: Macy Mis M.D.   On: 01/11/2021 17:28   MR LUMBAR SPINE WO CONTRAST  Result Date: 01/11/2021 CLINICAL DATA:  Low back pain.  Cauda equina syndrome suspected. EXAM: MRI LUMBAR SPINE WITHOUT CONTRAST TECHNIQUE: Multiplanar, multisequence MR imaging of the lumbar spine was performed. No intravenous contrast was administered. COMPARISON:  None. FINDINGS: Segmentation:  Standard. Alignment:  Physiologic. Vertebrae: Bilateral sacral fracture with prominent marrow edema, consistent with acute/subacute process. No evidence of discitis or aggressive bone lesion. Conus medullaris and cauda equina: Conus extends to the L1 level. Conus and cauda equina appear normal. Paraspinal and other soft tissues: Negative. Disc levels: T12-L1: No spinal canal or neural foraminal stenosis. L1-2: Shallow disc bulge. No spinal canal or neural foraminal stenosis. L2-3: Shallow disc bulge with left foraminal annular tear and mild facet degenerative changes. No significant spinal canal or neural foraminal stenosis. L3-4: Shallow disc bulge and hypertrophic facet degenerative changes without significant spinal canal or neural foraminal stenosis. L4-5: Mild facet degenerative changes. No spinal canal or neural foraminal stenosis. L5-S1: Shallow disc bulge, slightly asymmetric to the left and mild facet degenerative changes. No significant spinal canal or neural  foraminal stenosis. IMPRESSION: 1. 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 2. Mild degenerative changes of the lumbar spine without significant spinal canal or neural foraminal stenosis. 3. 4. These results were called by telephone at the time of interpretation on 01/11/2021 at 2:07 pm to provider [facility] , who verbally acknowledged these results. Electronically Signed   By: Pedro Earls M.D.   On: 01/11/2021 14:07   DG C-Arm 1-60 Min  Result Date: 01/12/2021 CLINICAL DATA:  Sacroplasty EXAM: LUMBAR SPINE - 2-3 VIEW; DG C-ARM 1-60 MIN COMPARISON:  01/12/2021 FLUOROSCOPY TIME:  1 minutes 7 seconds Dose: 35.716 mGy Images: 3 FINDINGS: Lateral images only. Osseous demineralization. Images demonstrate injection of cement into the S1 and S2 segments of the sacrum. Some of the cement extends beyond osseous margins dorsally and overlying the posterior aspect of the L5-S1 disc space. IMPRESSION: Post sacroplasty as above. Electronically Signed   By: Lavonia Dana M.D.   On: 01/12/2021 12:59   DG Hip Unilat W or Wo Pelvis 2-3 Views Right  Result Date: 01/11/2021 CLINICAL DATA:  Status post fall down stairs 12/23/2020. Pelvic pain. Known  sacral fractures. EXAM: DG HIP (WITH OR WITHOUT PELVIS) 2-3V RIGHT COMPARISON:  MRI lumbar spine today. FINDINGS: The patient's sacral fractures seen on MRI are difficult to visualize on this examination. No other fracture is identified. The hips are located. Joint spaces are preserved. IMPRESSION: Sacral fracture seen on MRI are difficult to visualize on this exam. No acute bony abnormality is seen. Electronically Signed   By: Inge Rise M.D.   On: 01/11/2021 15:04        Scheduled Meds:  insulin aspart  0-15 Units Subcutaneous TID WC   [START ON 01/13/2021] levothyroxine  100 mcg Oral QAC breakfast   pantoprazole  40 mg Oral Daily   rosuvastatin  10 mg Oral QHS   sodium chloride flush  3 mL Intravenous Q12H    tiZANidine  4 mg Oral TID   Continuous Infusions:   LOS: 0 days        Hosie Poisson, MD [facility]   To contact the attending provider between 7A-7P or the covering provider during after hours 7P-7A, please log into the web site [email or web address] and access using universal [place] password for that web site. If you do not have the password, please call the hospital operator.  01/12/2021, 2:26 PM

## 2021-01-12 NOTE — Consult Note (Signed)
Referring Physician:  No referring provider defined for this encounter.  Primary Physician:  [name], MD  Chief Complaint:  fall, low back pain  History of Present Illness: 01/12/2021 Alisha Sanchez is a 65 y.o. female who presents with the chief complaint of fall and continued pain particularly with changing positions.  She fell ~2 weeks ago.  She fell down 10 steps going into the basement to do laundry.  She has pain all over from the fall, but the pain in her sacrum and low back has continued.  She has started to use a walker.  She has had some urinary incontinence which is unexplained.    She has no clear radicular symptoms.  She has no numbness currently.  Review of Systems:  A 10 point review of systems is negative, except for the pertinent positives and negatives detailed in the HPI.  Past Medical History: Past Medical History:  Diagnosis Date   Arthritis    knees   Cancer ([place])    Thyroid   Complication of anesthesia    Depression    Diabetes mellitus type 2 in obese (HCC)    Dyspnea    GERD (gastroesophageal reflux disease)    Headache    migraines - 3-4x/yr   HLD (hyperlipidemia)    HTN (hypertension)    Hypothyroidism    no thyroid   Localized swelling of both lower legs    leaky veins   Lower extremity edema    Neck pain    left side   Numbness and tingling    numbness in hands and face tingles   PONV (postoperative nausea and vomiting)    Pulmonary embolus ([place])    after thyroid surgery   Vertigo    no episodes in approx 2 yrs    Past Surgical History: Past Surgical History:  Procedure Laterality Date   c-sections     x2    CATARACT EXTRACTION W/PHACO Left 08/27/2017   Procedure: CATARACT EXTRACTION PHACO AND INTRAOCULAR LENS PLACEMENT ([place]) LEFT DIABETIC;  Surgeon: Leandrew Koyanagi, MD;  Location: [place];  Service: Ophthalmology;  Laterality: Left;  Diabetic - oral meds   COLONOSCOPY N/A 11/10/2015   Procedure:  COLONOSCOPY;  Surgeon: Hulen Luster, MD;  Location: [facility];  Service: Gastroenterology;  Laterality: N/A;   ESOPHAGOGASTRODUODENOSCOPY N/A 11/10/2015   Procedure: ESOPHAGOGASTRODUODENOSCOPY (EGD) with dialation;  Surgeon: Hulen Luster, MD;  Location: [place];  Service: Gastroenterology;  Laterality: N/A;   IVC FILTER INSERTION N/A 03/17/2019   Procedure: IVC FILTER INSERTION;  Surgeon: Algernon Huxley, MD;  Location: [facility];  Service: Cardiovascular;  Laterality: N/A;   IVC FILTER REMOVAL N/A 06/05/2019   Procedure: IVC FILTER REMOVAL;  Surgeon: Algernon Huxley, MD;  Location: [facility];  Service: Cardiovascular;  Laterality: N/A;   KNEE ARTHROPLASTY Left 03/24/2019   Procedure: COMPUTER ASSISTED TOTAL KNEE ARTHROPLASTY - RNFA;  Surgeon: Dereck Leep, MD;  Location: [facility];  Service: Orthopedics;  Laterality: Left;   SHOULDER ARTHROSCOPY Left 11/15/2018   Procedure: ARTHROSCOPY SHOULDER WITH DEBRIDEMENT DECOMPRESSION, ROTATOR CUFF REPAIR AND POSSIBLE BICEPS TENODESIS;  Surgeon: Corky Mull, MD;  Location: [facility];  Service: Orthopedics;  Laterality: Left;  SMITH AND NEPHEW REGENTEN PATCH   THYROIDECTOMY      Allergies: Allergies as of 01/11/2021   (No Known Allergies)    Medications:  Current Facility-Administered Medications:    acetaminophen (TYLENOL) tablet 650 mg, 650 mg,  Oral, Q6H PRN **OR** acetaminophen (TYLENOL) suppository 650 mg, 650 mg, Rectal, Q6H PRN, Marcelyn Bruins, MD   HYDROcodone-acetaminophen (NORCO/VICODIN) 5-325 MG per tablet 1-2 tablet, 1-2 tablet, Oral, Q4H PRN, Marcelyn Bruins, MD, 1 tablet at 01/11/21 2126   HYDROmorphone (DILAUDID) injection 0.5 mg, 0.5 mg, Intravenous, Q4H PRN, Marcelyn Bruins, MD   insulin aspart (novoLOG) injection 0-15 Units, 0-15 Units, Subcutaneous, TID WC, Marcelyn Bruins, MD   levothyroxine (SYNTHROID) tablet 125 mcg, 125 mcg, Oral, QAC breakfast, Marcelyn Bruins, MD,  125 mcg at 01/12/21 4496   melatonin tablet 10 mg, 10 mg, Oral, QHS PRN, Marcelyn Bruins, MD   pantoprazole (PROTONIX) EC tablet 40 mg, 40 mg, Oral, Daily, Marcelyn Bruins, MD   polyethylene glycol (MIRALAX / GLYCOLAX) packet 17 g, 17 g, Oral, Daily PRN, Marcelyn Bruins, MD   rosuvastatin (CRESTOR) tablet 10 mg, 10 mg, Oral, QHS, Marcelyn Bruins, MD, 10 mg at 01/11/21 2126   sodium chloride flush (NS) 0.9 % injection 3 mL, 3 mL, Intravenous, Q12H, Marcelyn Bruins, MD, 3 mL at 01/11/21 2126   tiZANidine (ZANAFLEX) tablet 4 mg, 4 mg, Oral, TID, Marcelyn Bruins, MD, 4 mg at 01/11/21 2125   Social History: Social History   Tobacco Use   Smoking status: Never   Smokeless tobacco: Never   Tobacco comments:    tobacco use- no  Vaping Use   Vaping Use: Never used  Substance Use Topics   Alcohol use: Yes    Alcohol/week: 1.0 standard drink    Types: 1 Glasses of wine per week    Comment: occassional wine   Drug use: No    Family Medical History: Family History  Problem Relation Age of Onset   Liver cancer Father    Lung cancer Father    Diabetes Father    Stroke Other    Breast cancer Maternal Aunt 32   Breast cancer Paternal Aunt 49    Physical Examination: Vitals:   01/11/21 1300 01/12/21 0444  BP: 110/70 122/73  Pulse: 70 66  Resp: 16 17  Temp:  98.1 F (36.7 C)  SpO2: 99% 99%     General: Patient is well developed, well nourished, calm, collected, and in no apparent distress.  Psychiatric: Patient is non-anxious.  Head:  Pupils equal, round, and reactive to light.  ENT:  Oral mucosa appears well hydrated.  Neck:   Supple.  Full range of motion.  Respiratory: Patient is breathing without any difficulty.  Extremities: No edema.  Vascular: Palpable pulses in dorsal pedal vessels.  Skin:   On exposed skin, there are no abnormal skin lesions.  NEUROLOGICAL:  General: In no acute distress.   Awake, alert, oriented to person, place,  and time.  Pupils equal round and reactive to light.  Facial tone is symmetric.  Tongue protrusion is midline.  There is no pronator drift.   Strength: Side Biceps Triceps Deltoid Interossei Grip Wrist Ext. Wrist Flex.  R 5 5 5 5 5 5 5   L 5 5 5 5 5 5 5    Side Iliopsoas Quads Hamstring PF DF EHL  R 5 5 5 5 5 5   L 5 5 5 5 5 5     Bilateral upper and lower extremity sensation is intact to light touch. Reflexes are 1+ and symmetric at the biceps, triceps, brachioradialis, patella and achilles. Hoffman's is absent.  Gait untested due to pain.  Imaging: MRI TL spine 01/11/21 IMPRESSION: No compression  fracture. No abnormal cord signal. No significant stenosis.     Electronically Signed   By: Macy Mis M.D.   On: 01/11/2021 17:28 IMPRESSION: 1. 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 2. Mild degenerative changes of the lumbar spine without significant spinal canal or neural foraminal stenosis. 3. 4. These results were called by telephone at the time of interpretation on 01/11/2021 at 2:07 pm to provider [facility] , who verbally acknowledged these results.     Electronically Signed   By: Pedro Earls M.D.   On: 01/11/2021 14:07   I have personally reviewed the images and agree with the above interpretation.  Labs: CBC Latest Ref Rng & Units 01/12/2021 01/11/2021 12/23/2020  WBC 4.0 - 10.5 K/uL 6.0 7.6 10.2  Hemoglobin 12.0 - 15.0 g/dL 11.9(L) 13.5 12.0  Hematocrit 36.0 - 46.0 % 36.0 40.4 35.2(L)  Platelets 150 - 400 K/uL 351 265 251       Assessment and Plan: Ms. Marmol is a pleasant 65 y.o. female with sacral insufficiency fractures.  She has no other explanation for her urinary symptoms, but she does not have compressive cause of cauda equina syndrome.    - No neurosurgical intervention - Would consider sacroplasty with Dr. Rudene Christians if he feels indicated. - No follow up necessary. - If urinary symptoms persist,  consider urology consultation or neurology consultation   Keiasia Christianson K. Izora Ribas MD, [place] Dept. of Neurosurgery

## 2021-01-12 NOTE — Consult Note (Signed)
Reason for Consult: Sacral fracture Referring Physician: Chalice Sanchez is an 65 y.o. female.  HPI: Patient suffered a fall about 2 weeks at home and fell down stairs at home.  She is been having increasing pain and difficulty walking needing to use a walker.  She came to the emergency room yesterday with severe pain and difficulty taking care of her self at home.  She has been using a walker.  Subsequent thoracic and lumbar MRI shows sacral insufficiency fractures.  She has some lateral thigh numbness on the right as well as some urinary incontinence.  MRI did not show evidence of nerve impingement however and Dr. Cari [name] was called and reviewed x-rays MRI and did not feel there is a neurosurgical indication for any treatment.  Past Medical History:  Diagnosis Date   Arthritis    knees   Cancer ([place])    Thyroid   Complication of anesthesia    Depression    Diabetes mellitus type 2 in obese (HCC)    Dyspnea    GERD (gastroesophageal reflux disease)    Headache    migraines - 3-4x/yr   HLD (hyperlipidemia)    HTN (hypertension)    Hypothyroidism    no thyroid   Localized swelling of both lower legs    leaky veins   Lower extremity edema    Neck pain    left side   Numbness and tingling    numbness in hands and face tingles   PONV (postoperative nausea and vomiting)    Pulmonary embolus ([place])    after thyroid surgery   Vertigo    no episodes in approx 2 yrs    Past Surgical History:  Procedure Laterality Date   c-sections     x2    CATARACT EXTRACTION W/PHACO Left 08/27/2017   Procedure: CATARACT EXTRACTION PHACO AND INTRAOCULAR LENS PLACEMENT ([place]) LEFT DIABETIC;  Surgeon: Leandrew Koyanagi, MD;  Location: [place];  Service: Ophthalmology;  Laterality: Left;  Diabetic - oral meds   COLONOSCOPY N/A 11/10/2015   Procedure: COLONOSCOPY;  Surgeon: Hulen Luster, MD;  Location: [facility];  Service: Gastroenterology;  Laterality: N/A;    ESOPHAGOGASTRODUODENOSCOPY N/A 11/10/2015   Procedure: ESOPHAGOGASTRODUODENOSCOPY (EGD) with dialation;  Surgeon: Hulen Luster, MD;  Location: [facility];  Service: Gastroenterology;  Laterality: N/A;   IVC FILTER INSERTION N/A 03/17/2019   Procedure: IVC FILTER INSERTION;  Surgeon: Algernon Huxley, MD;  Location: [facility];  Service: Cardiovascular;  Laterality: N/A;   IVC FILTER REMOVAL N/A 06/05/2019   Procedure: IVC FILTER REMOVAL;  Surgeon: Algernon Huxley, MD;  Location: [facility];  Service: Cardiovascular;  Laterality: N/A;   KNEE ARTHROPLASTY Left 03/24/2019   Procedure: COMPUTER ASSISTED TOTAL KNEE ARTHROPLASTY - RNFA;  Surgeon: Dereck Leep, MD;  Location: [facility];  Service: Orthopedics;  Laterality: Left;   SHOULDER ARTHROSCOPY Left 11/15/2018   Procedure: ARTHROSCOPY SHOULDER WITH DEBRIDEMENT DECOMPRESSION, ROTATOR CUFF REPAIR AND POSSIBLE BICEPS TENODESIS;  Surgeon: Corky Mull, MD;  Location: [place];  Service: Orthopedics;  Laterality: Left;  SMITH AND NEPHEW REGENTEN PATCH   THYROIDECTOMY      Family History  Problem Relation Age of Onset   Liver cancer Father    Lung cancer Father    Diabetes Father    Stroke Other    Breast cancer Maternal Aunt 32   Breast cancer Paternal Aunt 54    Social History:  reports that  she has never smoked. She has never used smokeless tobacco. She reports current alcohol use of about 1.0 standard drink of alcohol per week. She reports that she does not use drugs.  Allergies: No Known Allergies  Medications: I have reviewed the patient's current medications.  Results for orders placed or performed during the hospital encounter of 01/11/21 (from the past 48 hour(s))  Urinalysis, Complete w Microscopic Urine, Unspecified Source     Status: Abnormal   Collection Time: 01/11/21  4:17 PM  Result Value Ref Range   Color, Urine YELLOW (A) YELLOW   APPearance CLEAR (A) CLEAR   Specific Gravity, Urine 1.013  1.005 - 1.030   pH 7.0 5.0 - 8.0   Glucose, UA 50 (A) NEGATIVE mg/dL   Hgb urine dipstick NEGATIVE NEGATIVE   Bilirubin Urine NEGATIVE NEGATIVE   Ketones, ur NEGATIVE NEGATIVE mg/dL   Protein, ur NEGATIVE NEGATIVE mg/dL   Nitrite NEGATIVE NEGATIVE   Leukocytes,Ua TRACE (A) NEGATIVE   RBC / HPF 0-5 0 - 5 RBC/hpf   WBC, UA 0-5 0 - 5 WBC/hpf   Bacteria, UA NONE SEEN NONE SEEN   Squamous Epithelial / LPF 0-5 0 - 5   Mucus PRESENT     Comment: Performed at [facility], [address] [place], [place] [address]  Resp Panel by RT-PCR (Flu A&B, Covid) Nasopharyngeal Swab     Status: None   Collection Time: 01/11/21  7:30 PM   Specimen: Nasopharyngeal Swab; Nasopharyngeal(NP) swabs in vial transport medium  Result Value Ref Range   SARS Coronavirus 2 by RT PCR NEGATIVE NEGATIVE    Comment: (NOTE) SARS-CoV-2 target nucleic acids are NOT DETECTED.  The SARS-CoV-2 RNA is generally detectable in upper respiratory specimens during the acute phase of infection. The lowest concentration of SARS-CoV-2 viral copies this assay can detect is 138 copies/mL. A negative result does not preclude SARS-Cov-2 infection and should not be used as the sole basis for treatment or other patient management decisions. A negative result may occur with  improper specimen collection/handling, submission of specimen other than nasopharyngeal swab, presence of viral mutation(s) within the areas targeted by this assay, and inadequate number of viral copies(<138 copies/mL). A negative result must be combined with clinical observations, patient history, and epidemiological information. The expected result is Negative.  Fact Sheet for Patients:  EntrepreneurPulse.com.au  Fact Sheet for Healthcare Providers:  IncredibleEmployment.be  This test is no t yet approved or cleared by the [place] FDA and  has been authorized for detection and/or diagnosis of SARS-CoV-2  by FDA under an Emergency Use Authorization (EUA). This EUA will remain  in effect (meaning this test can be used) for the duration of the COVID-19 declaration under Section 564(b)(1) of the Act, 21 U.S.C.section 360bbb-3(b)(1), unless the authorization is terminated  or revoked sooner.       Influenza A by PCR NEGATIVE NEGATIVE   Influenza B by PCR NEGATIVE NEGATIVE    Comment: (NOTE) The Xpert Xpress SARS-CoV-2/FLU/RSV plus assay is intended as an aid in the diagnosis of influenza from Nasopharyngeal swab specimens and should not be used as a sole basis for treatment. Nasal washings and aspirates are unacceptable for Xpert Xpress SARS-CoV-2/FLU/RSV testing.  Fact Sheet for Patients: EntrepreneurPulse.com.au  Fact Sheet for Healthcare Providers: IncredibleEmployment.be  This test is not yet approved or cleared by the [place] FDA and has been authorized for detection and/or diagnosis of SARS-CoV-2 by FDA under an Emergency Use Authorization (EUA). This EUA will remain  in effect (meaning this test can be used) for the duration of the COVID-19 declaration under Section 564(b)(1) of the Act, 21 U.S.C. section 360bbb-3(b)(1), unless the authorization is terminated or revoked.  Performed at [facility], [address] [place], [place] [address]   CBC with Differential     Status: None   Collection Time: 01/11/21  8:14 PM  Result Value Ref Range   WBC 7.6 4.0 - 10.5 K/uL   RBC 4.77 3.87 - 5.11 MIL/uL   Hemoglobin 13.5 12.0 - 15.0 g/dL   HCT 40.4 36.0 - 46.0 %   MCV 84.7 80.0 - 100.0 fL   MCH 28.3 26.0 - 34.0 pg   MCHC 33.4 30.0 - 36.0 g/dL   RDW 14.4 11.5 - 15.5 %   Platelets 265 150 - 400 K/uL   nRBC 0.0 0.0 - 0.2 %   Neutrophils Relative % 71 %   Neutro Abs 5.5 1.7 - 7.7 K/uL   Lymphocytes Relative 20 %   Lymphs Abs 1.5 0.7 - 4.0 K/uL   Monocytes Relative 6 %   Monocytes Absolute 0.5 0.1 - 1.0 K/uL   Eosinophils  Relative 1 %   Eosinophils Absolute 0.1 0.0 - 0.5 K/uL   Basophils Relative 1 %   Basophils Absolute 0.1 0.0 - 0.1 K/uL   Immature Granulocytes 1 %   Abs Immature Granulocytes 0.05 0.00 - 0.07 K/uL    Comment: Performed at [facility], [address] [place], [place] [address]  Comprehensive metabolic panel     Status: Abnormal   Collection Time: 01/11/21  8:14 PM  Result Value Ref Range   Sodium 139 135 - 145 mmol/L   Potassium 4.0 3.5 - 5.1 mmol/L    Comment: HEMOLYSIS AT THIS LEVEL MAY AFFECT RESULT   Chloride 104 98 - 111 mmol/L   CO2 25 22 - 32 mmol/L   Glucose, Bld 100 (H) 70 - 99 mg/dL    Comment: Glucose reference range applies only to samples taken after fasting for at least 8 hours.   BUN 18 8 - 23 mg/dL   Creatinine, Ser 0.55 0.44 - 1.00 mg/dL   Calcium 9.3 8.9 - 10.3 mg/dL   Total Protein 8.2 (H) 6.5 - 8.1 g/dL   Albumin 4.2 3.5 - 5.0 g/dL   AST 24 15 - 41 U/L   ALT 13 0 - 44 U/L   Alkaline Phosphatase 96 38 - 126 U/L   Total Bilirubin 1.5 (H) 0.3 - 1.2 mg/dL   GFR, Estimated >60 >60 mL/min    Comment: (NOTE) Calculated using the CKD-EPI Creatinine Equation (2021)    Anion gap 10 5 - 15    Comment: Performed at [facility], [address] [place], [place] [address]  Type and screen [place]     Status: None (Preliminary result)   Collection Time: 01/12/21  5:36 AM  Result Value Ref Range   ABO/RH(D) PENDING    Antibody Screen PENDING    Sample Expiration      01/15/2021,2359 Performed at [facility], [address], [place],  [address]   Comprehensive metabolic panel     Status: Abnormal   Collection Time: 01/12/21  5:36 AM  Result Value Ref Range   Sodium 140 135 - 145 mmol/L   Potassium 3.5 3.5 - 5.1 mmol/L   Chloride 104 98 - 111 mmol/L   CO2 26 22 - 32 mmol/L   Glucose, Bld 114 (H) 70 -  99 mg/dL    Comment: Glucose reference range applies only to samples taken after fasting for at least  8 hours.   BUN 19 8 - 23 mg/dL   Creatinine, Ser 0.54 0.44 - 1.00 mg/dL   Calcium 9.1 8.9 - 10.3 mg/dL   Total Protein 7.6 6.5 - 8.1 g/dL   Albumin 4.0 3.5 - 5.0 g/dL   AST 14 (L) 15 - 41 U/L   ALT 10 0 - 44 U/L   Alkaline Phosphatase 94 38 - 126 U/L   Total Bilirubin 1.2 0.3 - 1.2 mg/dL   GFR, Estimated >60 >60 mL/min    Comment: (NOTE) Calculated using the CKD-EPI Creatinine Equation (2021)    Anion gap 10 5 - 15    Comment: Performed at [facility], [address] [place], [place] [address]  CBC     Status: Abnormal   Collection Time: 01/12/21  5:36 AM  Result Value Ref Range   WBC 6.0 4.0 - 10.5 K/uL   RBC 4.27 3.87 - 5.11 MIL/uL   Hemoglobin 11.9 (L) 12.0 - 15.0 g/dL   HCT 36.0 36.0 - 46.0 %   MCV 84.3 80.0 - 100.0 fL   MCH 27.9 26.0 - 34.0 pg   MCHC 33.1 30.0 - 36.0 g/dL   RDW 14.4 11.5 - 15.5 %   Platelets 351 150 - 400 K/uL   nRBC 0.0 0.0 - 0.2 %    Comment: Performed at [facility], [address] [place], [place] [address]    MR THORACIC SPINE WO CONTRAST  Result Date: 01/11/2021 CLINICAL DATA:  Low back pain with worsening right leg pain and numbness since recent fall EXAM: MRI THORACIC SPINE WITHOUT CONTRAST TECHNIQUE: Multiplanar, multisequence MR imaging of the thoracic spine was performed. No intravenous contrast was administered. COMPARISON:  None. FINDINGS: Alignment:  No significant anteroposterior listhesis. Vertebrae: There is no compression deformity. Vertebral body heights are maintained apart from mild degenerative endplate irregularity. There is no substantial marrow edema. No suspicious osseous lesion. Cord:  No abnormal signal. Paraspinal and other soft tissues: Unremarkable. Disc levels: Mild degenerative disc disease with small disc bulges or protrusions. Mild facet arthropathy. No significant canal or foraminal stenosis at any level. IMPRESSION: No compression fracture. No abnormal cord signal. No significant stenosis.  Electronically Signed   By: Macy Mis M.D.   On: 01/11/2021 17:28   MR LUMBAR SPINE WO CONTRAST  Result Date: 01/11/2021 CLINICAL DATA:  Low back pain.  Cauda equina syndrome suspected. EXAM: MRI LUMBAR SPINE WITHOUT CONTRAST TECHNIQUE: Multiplanar, multisequence MR imaging of the lumbar spine was performed. No intravenous contrast was administered. COMPARISON:  None. FINDINGS: Segmentation:  Standard. Alignment:  Physiologic. Vertebrae: Bilateral sacral fracture with prominent marrow edema, consistent with acute/subacute process. No evidence of discitis or aggressive bone lesion. Conus medullaris and cauda equina: Conus extends to the L1 level. Conus and cauda equina appear normal. Paraspinal and other soft tissues: Negative. Disc levels: T12-L1: No spinal canal or neural foraminal stenosis. L1-2: Shallow disc bulge. No spinal canal or neural foraminal stenosis. L2-3: Shallow disc bulge with left foraminal annular tear and mild facet degenerative changes. No significant spinal canal or neural foraminal stenosis. L3-4: Shallow disc bulge and hypertrophic facet degenerative changes without significant spinal canal or neural foraminal stenosis. L4-5: Mild facet degenerative changes. No spinal canal or neural foraminal stenosis. L5-S1: Shallow disc bulge, slightly asymmetric to the left and mild facet degenerative changes. No significant spinal canal or neural foraminal stenosis.  IMPRESSION: 1. 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 2. Mild degenerative changes of the lumbar spine without significant spinal canal or neural foraminal stenosis. 3. 4. These results were called by telephone at the time of interpretation on 01/11/2021 at 2:07 pm to provider [facility] , who verbally acknowledged these results. Electronically Signed   By: Pedro Earls M.D.   On: 01/11/2021 14:07   DG Hip Unilat W or Wo Pelvis 2-3 Views Right  Result Date: 01/11/2021 CLINICAL DATA:   Status post fall down stairs 12/23/2020. Pelvic pain. Known sacral fractures. EXAM: DG HIP (WITH OR WITHOUT PELVIS) 2-3V RIGHT COMPARISON:  MRI lumbar spine today. FINDINGS: The patient's sacral fractures seen on MRI are difficult to visualize on this examination. No other fracture is identified. The hips are located. Joint spaces are preserved. IMPRESSION: Sacral fracture seen on MRI are difficult to visualize on this exam. No acute bony abnormality is seen. Electronically Signed   By: Inge Rise M.D.   On: 01/11/2021 15:04    Review of Systems Blood pressure (!) 144/75, pulse 69, temperature 98.1 F (36.7 C), resp. rate 17, height 5\' 7"  (1.702 m), weight 81.6 kg, SpO2 99 %. Physical Exam She has no clonus on exam she has slightly diminished sensation on the lateral aspect of the thigh, she is tender to palpation to the sacrum right worse than left.  Skin is intact. Assessment/Plan: Sacral insufficiency fracture with the debilitating pain.  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  Discussed sacroplasty with her and she like to proceed with that in hopes of providing pain relief and get her ambulatory.  That surgery is scheduled for later today.  Alisha Sanchez 01/12/2021, 8:03 AM

## 2021-01-13 ENCOUNTER — Encounter: Payer: Self-pay | Admitting: Orthopedic Surgery

## 2021-01-13 DIAGNOSIS — E119 Type 2 diabetes mellitus without complications: Secondary | ICD-10-CM | POA: Diagnosis not present

## 2021-01-13 DIAGNOSIS — M5441 Lumbago with sciatica, right side: Secondary | ICD-10-CM

## 2021-01-13 DIAGNOSIS — M8448XD Pathological fracture, other site, subsequent encounter for fracture with routine healing: Secondary | ICD-10-CM | POA: Diagnosis not present

## 2021-01-13 LAB — CBC
HCT: 36 % (ref 36.0–46.0)
Hemoglobin: 11.5 g/dL — ABNORMAL LOW (ref 12.0–15.0)
MCH: 28.4 pg (ref 26.0–34.0)
MCHC: 31.9 g/dL (ref 30.0–36.0)
MCV: 88.9 fL (ref 80.0–100.0)
Platelets: 253 10*3/uL (ref 150–400)
RBC: 4.05 MIL/uL (ref 3.87–5.11)
RDW: 14.2 % (ref 11.5–15.5)
WBC: 6.9 10*3/uL (ref 4.0–10.5)
nRBC: 0 % (ref 0.0–0.2)

## 2021-01-13 LAB — GLUCOSE, CAPILLARY
Glucose-Capillary: 159 mg/dL — ABNORMAL HIGH (ref 70–99)
Glucose-Capillary: 148 mg/dL — ABNORMAL HIGH (ref 70–99)
Glucose-Capillary: 157 mg/dL — ABNORMAL HIGH (ref 70–99)
Glucose-Capillary: 108 mg/dL — ABNORMAL HIGH (ref 70–99)

## 2021-01-13 MED ORDER — ENOXAPARIN SODIUM 40 MG/0.4ML IJ SOSY
40.0000 mg | PREFILLED_SYRINGE | INTRAMUSCULAR | Status: DC
  Administered 2021-01-13: 40 mg via SUBCUTANEOUS
  Filled 2021-01-13: qty 0.4

## 2021-01-13 NOTE — Progress Notes (Signed)
  Subjective: 1 Day Post-Op Procedure(s) (LRB): SACROPLASTY (N/A) Patient reports pain as moderate.   Patient is well, and has had no acute complaints or problems Plan is to go Home versus rehab after hospital stay. Negative for chest pain and shortness of breath Fever: no Gastrointestinal: Negative for nausea and vomiting  Objective: Vital signs in last 24 hours: Temp:  [97.2 F (36.2 C)-98.5 F (36.9 C)] 97.6 F (36.4 C) (07/14 0422) Pulse Rate:  [63-69] 64 (07/14 0422) Resp:  [16-17] 16 (07/14 0422) BP: (95-144)/(60-88) 131/72 (07/14 0422) SpO2:  [95 %-99 %] 95 % (07/14 0422)  Intake/Output from previous day:  Intake/Output Summary (Last 24 hours) at 01/13/2021 0701 Last data filed at 01/12/2021 1222 Gross per 24 hour  Intake 300 ml  Output 500 ml  Net -200 ml    Intake/Output this shift: No intake/output data recorded.  Labs: Recent Labs    01/11/21 2014 01/12/21 0536 01/13/21 0612  HGB 13.5 11.9* 11.5*   Recent Labs    01/12/21 0536 01/13/21 0612  WBC 6.0 6.9  RBC 4.27 4.05  HCT 36.0 36.0  PLT 351 253   Recent Labs    01/11/21 2014 01/12/21 0536  NA 139 140  K 4.0 3.5  CL 104 104  CO2 25 26  BUN 18 19  CREATININE 0.55 0.54  GLUCOSE 100* 114*  CALCIUM 9.3 9.1   No results for input(s): LABPT, INR in the last 72 hours.   EXAM General - Patient is Alert and Oriented Extremity - Dorsiflexion/Plantar flexion intact Dressing/Incision - clean, dry, no drainage Motor Function -patient able to roll over comfortably.  Plantarflexion and dorsiflexion of feet is intact..   Past Medical History:  Diagnosis Date   Arthritis    knees   Cancer ([place])    Thyroid   Complication of anesthesia    Depression    Diabetes mellitus type 2 in obese (HCC)    Dyspnea    GERD (gastroesophageal reflux disease)    Headache    migraines - 3-4x/yr   HLD (hyperlipidemia)    HTN (hypertension)    Hypothyroidism    no thyroid   Localized swelling of both lower  legs    leaky veins   Lower extremity edema    Neck pain    left side   Numbness and tingling    numbness in hands and face tingles   PONV (postoperative nausea and vomiting)    Pulmonary embolus (HCC)    after thyroid surgery   Vertigo    no episodes in approx 2 yrs    Assessment/Plan: 1 Day Post-Op Procedure(s) (LRB): SACROPLASTY (N/A) Principal Problem:   Sacral insufficiency fracture Active Problems:   Diabetes ([place])   Hyperlipidemia   Essential hypertension   History of pulmonary embolus (PE)   Diabetes mellitus type 2, uncomplicated (HCC)   GERD (gastroesophageal reflux disease)   Thyroid cancer (HCC)  Estimated body mass index is 28.19 kg/m as calculated from the following:   Height as of this encounter: 5\' 7"  (1.702 m).   Weight as of this encounter: 81.6 kg. Advance diet Up with therapy  Follow-up at [facility] clinic orthopedics in 2 weeks for wound check.  DVT Prophylaxis - None   Alisha Dixon, PA-C Orthopaedic Surgery 01/13/2021, 7:01 AM

## 2021-01-13 NOTE — Progress Notes (Signed)
Physical Therapy Treatment Patient Details Name: Alisha Sanchez MRN: [id number] DOB: [birth year] Today's Date: 01/13/2021    History of Present Illness Alisha Sanchez is a 65 y.o. female with medical history significant of chronic pain, diabetes, hypertension, GERD, history of PE, hyperlipidemia, migraine, hypothyroidism with a history of thyroid cancer, diverticulosis who presents with ongoing back pain following a fall.  Patient had a fall around 2 weeks ago. S/P sacroplasty 01/12/21    PT Comments    Patient received in recliner, she reports back discomfort this pm. Agreeable to PT. Patient mod independent for bed mobility and transfers. She ambulated around nursing station but is pain limited reporting 8/10 pain upon returning to room. Patient will continue to benefit from skilled PT while here to improve functional mobility.     Follow Up Recommendations  Outpatient PT?     Equipment Recommendations  None recommended by PT    Recommendations for Other Services       Precautions / Restrictions Precautions Precautions: Fall Restrictions Weight Bearing Restrictions: Yes RLE Weight Bearing: Weight bearing as tolerated LLE Weight Bearing: Weight bearing as tolerated    Mobility  Bed Mobility Overal bed mobility: Modified Independent             General bed mobility comments: increased time, bed rails    Transfers Overall transfer level: Modified independent Equipment used: Rolling walker (2 wheeled) Transfers: Sit to/from Stand Sit to Stand: Modified independent (Device/Increase time)            Ambulation/Gait Ambulation/Gait assistance: Min guard;Supervision Gait Distance (Feet): 170 Feet Assistive device: Rolling walker (2 wheeled) Gait Pattern/deviations: Step-through pattern;Decreased step length - right;Decreased step length - left Gait velocity: decr   General Gait Details: patient ambulates well with RW, no lob, 8/10 pain in back with  ambulation.   Stairs             Wheelchair Mobility    Modified Rankin (Stroke Patients Only)       Balance Overall balance assessment: Modified Independent Sitting-balance support: Feet supported Sitting balance-Leahy Scale: Normal     Standing balance support: Bilateral upper extremity supported;During functional activity Standing balance-Leahy Scale: Good Standing balance comment: can stand without B UE support                            Cognition Arousal/Alertness: Awake/alert Behavior During Therapy: WFL for tasks assessed/performed Overall Cognitive Status: Within Functional Limits for tasks assessed                                        Exercises Other Exercises Other Exercises: educ re: AE for LB dressing/bathing; ECS Other Exercises: AP, heel slides, hip abd/add    General Comments        Pertinent Vitals/Pain Pain Assessment: 0-10 Pain Score: 8  Faces Pain Scale: Hurts a little bit Pain Location: back Pain Descriptors / Indicators: Discomfort;Sore Pain Intervention(s): Limited activity within patient's tolerance;Monitored during session;Repositioned    Home Living Family/patient expects to be discharged to:: Private residence Living Arrangements: Spouse/significant other Available Help at Discharge: Family;Available 24 hours/day Type of Home: House Home Access: Stairs to enter Entrance Stairs-Rails: Right;Left Home Layout: Two level;Able to live on main level with bedroom/bathroom Home Equipment: Gilford Rile - 2 wheels;Grab bars - tub/shower      Prior Function Level of Independence: Independent with  assistive device(s)          PT Goals (current goals can now be found in the care plan section) Acute Rehab PT Goals Patient Stated Goal: to return home, have decreased pain PT Goal Formulation: With patient Time For Goal Achievement: 01/20/21 Potential to Achieve Goals: Good Progress towards PT goals: Progressing  toward goals    Frequency    BID      PT Plan Current plan remains appropriate    Co-evaluation              AM-PAC PT "6 Clicks" Mobility   Outcome Measure  Help needed turning from your back to your side while in a flat bed without using bedrails?: A Little Help needed moving from lying on your back to sitting on the side of a flat bed without using bedrails?: A Little Help needed moving to and from a bed to a chair (including a wheelchair)?: A Little Help needed standing up from a chair using your arms (e.g., wheelchair or bedside chair)?: A Little Help needed to walk in hospital room?: A Little Help needed climbing 3-5 steps with a railing? : A Little 6 Click Score: 18    End of Session Equipment Utilized During Treatment: Gait belt Activity Tolerance: Patient limited by pain Patient left: in bed;with bed alarm set;with call bell/phone within reach Nurse Communication: Mobility status PT Visit Diagnosis: Muscle weakness (generalized) (M62.81);Pain;Difficulty in walking, not elsewhere classified (R26.2);Other abnormalities of gait and mobility (R26.89) Pain - Right/Left: Right Pain - part of body:  (low back)     Time: 1300-1315 PT Time Calculation (min) (ACUTE ONLY): 15 min  Charges:  $Gait Training: 8-22 mins                     Spyros Winch, PT, GCS 01/13/21,1:57 PM

## 2021-01-13 NOTE — Evaluation (Signed)
Occupational Therapy Evaluation Patient Details Name: Alisha Sanchez MRN: [id number] DOB: 04/06/1956 Today's Date: 01/13/2021    History of Present Illness Alisha Sanchez is a 65 y.o. female with medical history significant of chronic pain, diabetes, hypertension, GERD, history of PE, hyperlipidemia, migraine, hypothyroidism with a history of thyroid cancer, diverticulosis who presents with ongoing back pain following a fall.  Patient had a fall around 2 weeks ago. S/P sacroplasty 01/12/21   Clinical Impression   Alisha Sanchez presents today with limited endurance, weakness, and 4/10 pain in R LE, 2/10 pain in L LE. She requires increased time/effort for bed mobility, transfers, and toileting, but is able to perform all without physical assistance. She reports the numbness in R LE is dissipating (hence, the increased pain in R LE). Pt denies falls, other than the fall down the stairs on 12/23/20, which necessitated yesterday's procedure. She has all needed DME/AE at home, was IND prior to injury, and verbalizes understanding of rehab process. Pt and therapist in agreement that no further OT services are required at this time.     Follow Up Recommendations  No OT follow up    Equipment Recommendations  None recommended by OT    Recommendations for Other Services       Precautions / Restrictions Precautions Precautions: Fall Restrictions Weight Bearing Restrictions: Yes RLE Weight Bearing: Weight bearing as tolerated LLE Weight Bearing: Weight bearing as tolerated      Mobility Bed Mobility Overal bed mobility: Modified Independent             General bed mobility comments: increased time, bed rails    Transfers Overall transfer level: Needs assistance Equipment used: Rolling walker (2 wheeled) Transfers: Sit to/from Stand Sit to Stand: Min guard              Balance Overall balance assessment: Needs assistance Sitting-balance support: Feet  supported Sitting balance-Leahy Scale: Normal     Standing balance support: Bilateral upper extremity supported;During functional activity Standing balance-Leahy Scale: Fair Standing balance comment: reliant on B UE support                           ADL either performed or assessed with clinical judgement   ADL Overall ADL's : Needs assistance/impaired Eating/Feeding: Independent   Grooming: Set up;Sitting;Modified independent               Lower Body Dressing: Moderate assistance                       Vision Patient Visual Report: No change from baseline       Perception     Praxis      Pertinent Vitals/Pain Pain Assessment: 0-10 Pain Score: 4  Faces Pain Scale: Hurts a little bit Pain Location: R le Pain Descriptors / Indicators: Discomfort;Sore Pain Intervention(s): Monitored during session;Repositioned;Premedicated before session     Hand Dominance Right   Extremity/Trunk Assessment Upper Extremity Assessment Upper Extremity Assessment: Overall WFL for tasks assessed   Lower Extremity Assessment Lower Extremity Assessment: Overall WFL for tasks assessed   Cervical / Trunk Assessment Cervical / Trunk Assessment: Normal   Communication Communication Communication: No difficulties   Cognition Arousal/Alertness: Awake/alert Behavior During Therapy: WFL for tasks assessed/performed Overall Cognitive Status: Within Functional Limits for tasks assessed  General Comments       Exercises Other Exercises Other Exercises: educ re: AE for LB dressing/bathing; ECS   Shoulder Instructions      Home Living Family/patient expects to be discharged to:: Private residence Living Arrangements: Spouse/significant other Available Help at Discharge: Family;Available 24 hours/day Type of Home: House Home Access: Stairs to enter [employer] of Steps: 1 Entrance Stairs-Rails:  Right;Left Home Layout: Two level;Able to live on main level with bedroom/bathroom     Bathroom Shower/Tub: Tub/shower unit   Bathroom Toilet: Handicapped height     Home Equipment: Environmental consultant - 2 wheels;Grab bars - tub/shower          Prior Functioning/Environment Level of Independence: Independent with assistive device(s)                 OT Problem List: Decreased strength;Decreased activity tolerance;Pain;Impaired balance (sitting and/or standing);Impaired sensation;Decreased knowledge of use of DME or AE      OT Treatment/Interventions:      OT Goals(Current goals can be found in the care plan section) Acute Rehab OT Goals Patient Stated Goal: to return home, have decreased pain OT Goal Formulation: With patient Time For Goal Achievement: 01/27/21 Potential to Achieve Goals: Good  OT Frequency:     Barriers to D/C:            Co-evaluation              AM-PAC OT "6 Clicks" Daily Activity     Outcome Measure Help from another person eating meals?: None Help from another person taking care of personal grooming?: None Help from another person toileting, which includes using toliet, bedpan, or urinal?: A Little Help from another person bathing (including washing, rinsing, drying)?: A Little Help from another person to put on and taking off regular upper body clothing?: None Help from another person to put on and taking off regular lower body clothing?: A Little 6 Click Score: 21   End of Session Equipment Utilized During Treatment: Rolling walker  Activity Tolerance: Patient tolerated treatment well Patient left: in chair;with family/visitor present;with call bell/phone within reach;with chair alarm set  OT Visit Diagnosis: Unsteadiness on feet (R26.81);Pain;Muscle weakness (generalized) (M62.81)                Time: 8315-1761 OT Time Calculation (min): 17 min Charges:  OT General Charges $OT Visit: 1 Visit OT Evaluation $OT Eval Low Complexity: 1  Low OT Treatments $Self Care/Home Management : 8-22 mins Josiah Lobo, PhD, MS, OTR/L 01/13/21, 11:24 AM

## 2021-01-13 NOTE — Progress Notes (Signed)
PROGRESS NOTE    Alisha Sanchez  QHU:[id number] DOB: 05/28/1956 DOA: 01/11/2021 PCP: [name], MD    Chief Complaint  Patient presents with   Leg Pain    Brief Narrative:  Alisha Sanchez is a 65 y.o. female with medical history significant of chronic pain, diabetes, hypertension, GERD, history of PE, hyperlipidemia, migraine, hypothyroidism with a history of thyroid cancer, diverticulosis who presents with ongoing back pain following a fall. However MRI of the L-spine showed bilateral sacral insufficiency fractures and mild degenerative changes.  Orthopedics consulted and patient underwent sacroplasty on 01/12/2021.   Assessment & Plan:   Principal Problem:   Sacral insufficiency fracture Active Problems:   Diabetes ([place])   Hyperlipidemia   Essential hypertension   History of pulmonary embolus (PE)   Diabetes mellitus type 2, uncomplicated (HCC)   GERD (gastroesophageal reflux disease)   Thyroid cancer ([place])   Sacral insufficiency fracture S/p sacroplasty by orthopedics on 01/12/21 Pain control Physical therapy evaluations recommending Outpatient PT.  Pt reports she would like to work withPT tomorrow and see if she can go home.     Hypertension Well controlled.     Type 2 diabetes mellitus Non-insulin-dependent well controlled CBG'S CBG (last 3)  Recent Labs    01/12/21 2138 01/13/21 0810 01/13/21 1228  GLUCAP 124* 159* 148*    On sliding scale insulin while inpatient.    Hypothyroidism Continue with Synthroid.    Hyperlipidemia Continue with statin.   GERD Continue with PPI   Bilateral leg pain with cramps.  She rpts having a h/o ofPE in the past.  Will get venous duplex of the lower extremities.    DVT prophylaxis: LOVENOX.  Code Status: (Full code Family Communication: None at bedside Disposition:   Status is: Observation  The patient will require care spanning > 2 midnights and should be moved to inpatient because:  Unsafe d/c plan  Dispo: The patient is from: Home              Anticipated d/c is to:  pending              Patient currently is not medically stable to d/c.   Difficult to place patient No       Consultants:  Orthopedics.   Procedures:sacroplasty on 01/12/21 Antimicrobials: none.    Subjective: Pain sub optimal.  Bilateral leg pain.   Objective: Vitals:   01/13/21 0422 01/13/21 0721 01/13/21 0721 01/13/21 1226  BP: 131/72 115/74 115/74 109/65  Pulse: 64 63 63 61  Resp: 16 15 15 16   Temp: 97.6 F (36.4 C) 97.7 F (36.5 C) 97.7 F (36.5 C) 98.6 F (37 C)  TempSrc:  Oral Oral   SpO2: 95%  93% 96%  Weight:      Height:       No intake or output data in the 24 hours ending 01/13/21 1408  Filed Weights   01/11/21 0934  Weight: 81.6 kg    Examination:  General exam: alert and comfortable.  Respiratory system: clear to auscultation, no wheezing heard.  Cardiovascular system: S1 & S2 heard, RRR, no JVD no pedal edema.  Gastrointestinal system: Abdomen is soft, NT ND BS+ Central nervous system: Alert and oriented, non focal  Extremities: no pedal edema.  Skin: No rashes seen.  Psychiatry:  Mood is appropriate.     Data Reviewed: I have personally reviewed following labs and imaging studies  CBC: Recent Labs  Lab 01/11/21 2014 01/12/21 0536 01/13/21 0612  WBC  7.6 6.0 6.9  NEUTROABS 5.5  --   --   HGB 13.5 11.9* 11.5*  HCT 40.4 36.0 36.0  MCV 84.7 84.3 88.9  PLT 265 351 253     Basic Metabolic Panel: Recent Labs  Lab 01/11/21 2014 01/12/21 0536  NA 139 140  K 4.0 3.5  CL 104 104  CO2 25 26  GLUCOSE 100* 114*  BUN 18 19  CREATININE 0.55 0.54  CALCIUM 9.3 9.1     GFR: Estimated Creatinine Clearance: 77 mL/min (by C-G formula based on SCr of 0.54 mg/dL).  Liver Function Tests: Recent Labs  Lab 01/11/21 2014 01/12/21 0536  AST 24 14*  ALT 13 10  ALKPHOS 96 94  BILITOT 1.5* 1.2  PROT 8.2* 7.6  ALBUMIN 4.2 4.0     CBG: Recent  Labs  Lab 01/12/21 1413 01/12/21 1557 01/12/21 2138 01/13/21 0810 01/13/21 1228  GLUCAP 121* 113* 124* 159* 148*      Recent Results (from the past 240 hour(s))  Resp Panel by RT-PCR (Flu A&B, Covid) Nasopharyngeal Swab     Status: None   Collection Time: 01/11/21  7:30 PM   Specimen: Nasopharyngeal Swab; Nasopharyngeal(NP) swabs in vial transport medium  Result Value Ref Range Status   SARS Coronavirus 2 by RT PCR NEGATIVE NEGATIVE Final    Comment: (NOTE) SARS-CoV-2 target nucleic acids are NOT DETECTED.  The SARS-CoV-2 RNA is generally detectable in upper respiratory specimens during the acute phase of infection. The lowest concentration of SARS-CoV-2 viral copies this assay can detect is 138 copies/mL. A negative result does not preclude SARS-Cov-2 infection and should not be used as the sole basis for treatment or other patient management decisions. A negative result may occur with  improper specimen collection/handling, submission of specimen other than nasopharyngeal swab, presence of viral mutation(s) within the areas targeted by this assay, and inadequate number of viral copies(<138 copies/mL). A negative result must be combined with clinical observations, patient history, and epidemiological information. The expected result is Negative.  Fact Sheet for Patients:  EntrepreneurPulse.com.au  Fact Sheet for Healthcare Providers:  IncredibleEmployment.be  This test is no t yet approved or cleared by the [place] FDA and  has been authorized for detection and/or diagnosis of SARS-CoV-2 by FDA under an Emergency Use Authorization (EUA). This EUA will remain  in effect (meaning this test can be used) for the duration of the COVID-19 declaration under Section 564(b)(1) of the Act, 21 U.S.C.section 360bbb-3(b)(1), unless the authorization is terminated  or revoked sooner.       Influenza A by PCR NEGATIVE NEGATIVE Final    Influenza B by PCR NEGATIVE NEGATIVE Final    Comment: (NOTE) The Xpert Xpress SARS-CoV-2/FLU/RSV plus assay is intended as an aid in the diagnosis of influenza from Nasopharyngeal swab specimens and should not be used as a sole basis for treatment. Nasal washings and aspirates are unacceptable for Xpert Xpress SARS-CoV-2/FLU/RSV testing.  Fact Sheet for Patients: EntrepreneurPulse.com.au  Fact Sheet for Healthcare Providers: IncredibleEmployment.be  This test is not yet approved or cleared by the [place] FDA and has been authorized for detection and/or diagnosis of SARS-CoV-2 by FDA under an Emergency Use Authorization (EUA). This EUA will remain in effect (meaning this test can be used) for the duration of the COVID-19 declaration under Section 564(b)(1) of the Act, 21 U.S.C. section 360bbb-3(b)(1), unless the authorization is terminated or revoked.  Performed at [facility], [address] [place], [place] [address]  Radiology Studies: DG Lumbar Spine 2-3 Views  Result Date: 01/12/2021 CLINICAL DATA:  Sacroplasty EXAM: LUMBAR SPINE - 2-3 VIEW; DG C-ARM 1-60 MIN COMPARISON:  01/12/2021 FLUOROSCOPY TIME:  1 minutes 7 seconds Dose: 35.716 mGy Images: 3 FINDINGS: Lateral images only. Osseous demineralization. Images demonstrate injection of cement into the S1 and S2 segments of the sacrum. Some of the cement extends beyond osseous margins dorsally and overlying the posterior aspect of the L5-S1 disc space. IMPRESSION: Post sacroplasty as above. Electronically Signed   By: Lavonia Dana M.D.   On: 01/12/2021 12:59   MR THORACIC SPINE WO CONTRAST  Result Date: 01/11/2021 CLINICAL DATA:  Low back pain with worsening right leg pain and numbness since recent fall EXAM: MRI THORACIC SPINE WITHOUT CONTRAST TECHNIQUE: Multiplanar, multisequence MR imaging of the thoracic spine was performed. No intravenous contrast was  administered. COMPARISON:  None. FINDINGS: Alignment:  No significant anteroposterior listhesis. Vertebrae: There is no compression deformity. Vertebral body heights are maintained apart from mild degenerative endplate irregularity. There is no substantial marrow edema. No suspicious osseous lesion. Cord:  No abnormal signal. Paraspinal and other soft tissues: Unremarkable. Disc levels: Mild degenerative disc disease with small disc bulges or protrusions. Mild facet arthropathy. No significant canal or foraminal stenosis at any level. IMPRESSION: No compression fracture. No abnormal cord signal. No significant stenosis. Electronically Signed   By: Macy Mis M.D.   On: 01/11/2021 17:28   DG C-Arm 1-60 Min  Result Date: 01/12/2021 CLINICAL DATA:  Sacroplasty EXAM: LUMBAR SPINE - 2-3 VIEW; DG C-ARM 1-60 MIN COMPARISON:  01/12/2021 FLUOROSCOPY TIME:  1 minutes 7 seconds Dose: 35.716 mGy Images: 3 FINDINGS: Lateral images only. Osseous demineralization. Images demonstrate injection of cement into the S1 and S2 segments of the sacrum. Some of the cement extends beyond osseous margins dorsally and overlying the posterior aspect of the L5-S1 disc space. IMPRESSION: Post sacroplasty as above. Electronically Signed   By: Lavonia Dana M.D.   On: 01/12/2021 12:59   DG Hip Unilat W or Wo Pelvis 2-3 Views Right  Result Date: 01/11/2021 CLINICAL DATA:  Status post fall down stairs 12/23/2020. Pelvic pain. Known sacral fractures. EXAM: DG HIP (WITH OR WITHOUT PELVIS) 2-3V RIGHT COMPARISON:  MRI lumbar spine today. FINDINGS: The patient's sacral fractures seen on MRI are difficult to visualize on this examination. No other fracture is identified. The hips are located. Joint spaces are preserved. IMPRESSION: Sacral fracture seen on MRI are difficult to visualize on this exam. No acute bony abnormality is seen. Electronically Signed   By: Inge Rise M.D.   On: 01/11/2021 15:04        Scheduled Meds:   enoxaparin (LOVENOX) injection  40 mg Subcutaneous Q24H   insulin aspart  0-15 Units Subcutaneous TID WC   levothyroxine  100 mcg Oral QAC breakfast   pantoprazole  40 mg Oral Daily   rosuvastatin  10 mg Oral QHS   sodium chloride flush  3 mL Intravenous Q12H   tiZANidine  4 mg Oral TID   Continuous Infusions:   LOS: 0 days        Hosie Poisson, MD [facility]   To contact the attending provider between 7A-7P or the covering provider during after hours 7P-7A, please log into the web site [email or web address] and access using universal [place] password for that web site. If you do not have the password, please call the hospital operator.  01/13/2021, 2:08 PM

## 2021-01-13 NOTE — Evaluation (Signed)
Physical Therapy Evaluation Patient Details Name: Alisha Sanchez MRN: [id number] DOB: 1956-01-13 Today's Date: 01/13/2021   History of Present Illness  Alisha Sanchez is a 65 y.o. female with medical history significant of chronic pain, diabetes, hypertension, GERD, history of PE, hyperlipidemia, migraine, hypothyroidism with a history of thyroid cancer, diverticulosis who presents with ongoing back pain following a fall.  Patient had a fall around 2 weeks ago. S/P sacroplasty 01/12/21   Clinical Impression  Patient received in bed, agrees to PT assessment. She is mod independent with bed mobility, transfers with min guard. Ambulated into bathroom then out in hallway 125 feet with RW and min guard. Patient will continue to benefit from skilled PT while here to improve ambulation and activity tolerance for return home.     Follow Up Recommendations Outpatient PT    Equipment Recommendations  None recommended by PT    Recommendations for Other Services       Precautions / Restrictions Precautions Precautions: Fall Restrictions Weight Bearing Restrictions: Yes RLE Weight Bearing: Weight bearing as tolerated LLE Weight Bearing: Weight bearing as tolerated      Mobility  Bed Mobility Overal bed mobility: Modified Independent             General bed mobility comments: increased time, bed rails    Transfers Overall transfer level: Needs assistance Equipment used: Rolling walker (2 wheeled) Transfers: Sit to/from Stand Sit to Stand: Min guard            Ambulation/Gait Ambulation/Gait assistance: Min guard Gait Distance (Feet): 125 Feet Assistive device: Rolling walker (2 wheeled) Gait Pattern/deviations: Step-through pattern;Decreased step length - right;Decreased step length - left Gait velocity: decr   General Gait Details: patient ambulates well with RW, no lob, mild discomfort.  Stairs            Wheelchair Mobility    Modified Rankin  (Stroke Patients Only)       Balance Overall balance assessment: Needs assistance Sitting-balance support: Feet supported Sitting balance-Leahy Scale: Normal     Standing balance support: Bilateral upper extremity supported;During functional activity Standing balance-Leahy Scale: Fair Standing balance comment: reliant on B UE support                             Pertinent Vitals/Pain Pain Assessment: Faces Faces Pain Scale: Hurts a little bit Pain Location: R le Pain Descriptors / Indicators: Discomfort;Sore Pain Intervention(s): Monitored during session;Premedicated before session;Repositioned    Home Living Family/patient expects to be discharged to:: Private residence Living Arrangements: Spouse/significant other Available Help at Discharge: Family;Available 24 hours/day Type of Home: House Home Access: Stairs to enter Entrance Stairs-Rails: Right;Left Entrance Stairs-Number of Steps: 1 Home Layout: Two level;Able to live on main level with bedroom/bathroom Home Equipment: Gilford Rile - 2 wheels;Grab bars - tub/shower      Prior Function Level of Independence: Independent with assistive device(s)               Hand Dominance   Dominant Hand: Right    Extremity/Trunk Assessment   Upper Extremity Assessment Upper Extremity Assessment: Defer to OT evaluation    Lower Extremity Assessment Lower Extremity Assessment: Overall WFL for tasks assessed    Cervical / Trunk Assessment Cervical / Trunk Assessment: Normal  Communication   Communication: No difficulties  Cognition Arousal/Alertness: Awake/alert Behavior During Therapy: WFL for tasks assessed/performed Overall Cognitive Status: Within Functional Limits for tasks assessed  General Comments      Exercises     Assessment/Plan    PT Assessment Patient needs continued PT services  PT Problem List Decreased strength;Decreased  mobility;Decreased activity tolerance;Pain       PT Treatment Interventions DME instruction;Therapeutic exercise;Gait training;Stair training;Functional mobility training;Therapeutic activities;Patient/family education    PT Goals (Current goals can be found in the Care Plan section)  Acute Rehab PT Goals Patient Stated Goal: to return home, have decreased pain PT Goal Formulation: With patient Time For Goal Achievement: 01/20/21 Potential to Achieve Goals: Good    Frequency BID   Barriers to discharge        Co-evaluation               AM-PAC PT "6 Clicks" Mobility  Outcome Measure Help needed turning from your back to your side while in a flat bed without using bedrails?: None Help needed moving from lying on your back to sitting on the side of a flat bed without using bedrails?: A Little Help needed moving to and from a bed to a chair (including a wheelchair)?: A Little Help needed standing up from a chair using your arms (e.g., wheelchair or bedside chair)?: A Little Help needed to walk in hospital room?: A Little Help needed climbing 3-5 steps with a railing? : A Little 6 Click Score: 19    End of Session Equipment Utilized During Treatment: Gait belt Activity Tolerance: Patient tolerated treatment well Patient left: in chair;with call bell/phone within reach Nurse Communication: Mobility status PT Visit Diagnosis: Muscle weakness (generalized) (M62.81);Pain;Difficulty in walking, not elsewhere classified (R26.2);Other abnormalities of gait and mobility (R26.89) Pain - Right/Left: Right Pain - part of body: Leg    Time: 5790-3833 PT Time Calculation (min) (ACUTE ONLY): 15 min   Charges:              [employer], PT, [facility] 01/13/21,11:11 AM

## 2021-01-13 NOTE — Plan of Care (Signed)

## 2021-01-13 NOTE — TOC Progression Note (Signed)
Transition of Care [facility]) - Progression Note    Patient Details  Name: Alisha Sanchez MRN: [id number] Date of Birth: Nov 28, 1955  Transition of Care [facility] CM/SW [place], RN Phone Number: 01/13/2021, 4:15 PM  Clinical Narrative:     Met with the patient and her husband in the room, Discussed DC plan and needs She has a RW and Raised toilet seat at home and does not see that she needs additional DME She would like to go to Outpatient PT at [facility] notified Jefm Bryant and asked that they set her up with appointment, She has transportation to and from the doctor, she can afford her medication, no additional needs at this time  Expected Discharge Plan: OP Rehab Barriers to Discharge: Barriers Resolved  Expected Discharge Plan and Services Expected Discharge Plan: OP Rehab   Discharge Planning Services: CM Consult   Living arrangements for the past 2 months: Single Family Home                 DME Arranged: N/A         HH Arranged: NA           Social Determinants of Health (SDOH) Interventions    Readmission Risk Interventions No flowsheet data found.

## 2021-01-14 ENCOUNTER — Observation Stay: Payer: Medicare PPO

## 2021-01-14 DIAGNOSIS — K219 Gastro-esophageal reflux disease without esophagitis: Secondary | ICD-10-CM | POA: Diagnosis not present

## 2021-01-14 DIAGNOSIS — I10 Essential (primary) hypertension: Secondary | ICD-10-CM | POA: Diagnosis not present

## 2021-01-14 DIAGNOSIS — E119 Type 2 diabetes mellitus without complications: Secondary | ICD-10-CM | POA: Diagnosis not present

## 2021-01-14 DIAGNOSIS — M8448XD Pathological fracture, other site, subsequent encounter for fracture with routine healing: Secondary | ICD-10-CM | POA: Diagnosis not present

## 2021-01-14 LAB — GLUCOSE, CAPILLARY
Glucose-Capillary: 160 mg/dL — ABNORMAL HIGH (ref 70–99)
Glucose-Capillary: 154 mg/dL — ABNORMAL HIGH (ref 70–99)

## 2021-01-14 MED ORDER — HYDROCODONE-ACETAMINOPHEN 5-325 MG PO TABS: 1.0000 | ORAL_TABLET | Freq: Four times a day (QID) | ORAL | 0 refills | Status: AC | PRN

## 2021-01-14 MED ORDER — POLYETHYLENE GLYCOL 3350 17 G PO PACK: 17.0000 g | PACK | Freq: Every day | ORAL | 0 refills | Status: DC | PRN

## 2021-01-14 NOTE — Progress Notes (Signed)
Pt discharged home with husband. All instructions provided with questions answered.   Per d/c paperwork, no Lovenox or aspirin required; however, pt reports she got an alert that a script was sent for Lovenox to her pharmacy. Confirmed with Dr. Karleen Hampshire prior to d/c that no Lovenox was needed at this time.   BP 125/73 (BP Location: Left Arm)   Pulse 69   Temp 98 F (36.7 C)   Resp 16   Ht 5\' 7"  (1.702 m)   Wt 81.6 kg   SpO2 97%   BMI 28.19 kg/m

## 2021-01-14 NOTE — Progress Notes (Addendum)
Physical Therapy Treatment Patient Details Name: Alisha Sanchez MRN: [id number] DOB: 1955/12/01 Today's Date: 01/14/2021    History of Present Illness Alisha Sanchez is a 65 y.o. female with medical history significant of chronic pain, diabetes, hypertension, GERD, history of PE, hyperlipidemia, migraine, hypothyroidism with a history of thyroid cancer, diverticulosis who presents with ongoing back pain following a fall.  Patient had a fall around 2 weeks ago. S/P sacroplasty 01/12/21    PT Comments    Pt seen for skilled PT treatment with pt able to complete bed mobility, transfers, & gait with RW & mod I. Pt negotiates single step with RW & supervision after PT provides education & demo. Pt is able to toilet without physical assistance. Pt denies any concerns re: d/c home & pt is progressing well with mobility.    Follow Up Recommendations  Outpatient PT     Equipment Recommendations  None recommended by PT (pt already has RW)    Recommendations for Other Services       Precautions / Restrictions Precautions Precautions: Fall Restrictions Weight Bearing Restrictions: Yes RLE Weight Bearing: Weight bearing as tolerated LLE Weight Bearing: Weight bearing as tolerated    Mobility  Bed Mobility Overal bed mobility: Modified Independent             General bed mobility comments: supine>sit with bed rails, HOB elevated    Transfers Overall transfer level: Modified independent Equipment used: Rolling walker (2 wheeled) Transfers: Sit to/from Stand Sit to Stand: Modified independent (Device/Increase time)         General transfer comment: extra time to power up from low sitting surface  Ambulation/Gait Ambulation/Gait assistance: Modified independent (Device/Increase time) Gait Distance (Feet): 170 Feet Assistive device: Rolling walker (2 wheeled) Gait Pattern/deviations: Step-through pattern;Decreased step length - right;Decreased step length -  left Gait velocity: decreased       Stairs Stairs: Yes Stairs assistance: Supervision Stair Management: No rails;With walker;Forwards Number of Stairs: 1     Wheelchair Mobility    Modified Rankin (Stroke Patients Only)       Balance Overall balance assessment: Modified Independent Sitting-balance support: Feet supported Sitting balance-Leahy Scale: Normal     Standing balance support: Bilateral upper extremity supported;During functional activity Standing balance-Leahy Scale: Good                 High Level Balance Comments: Pt able to stand & perform peri hygiene with 1 or no UE support without LOB            Cognition Arousal/Alertness: Awake/alert Behavior During Therapy: WFL for tasks assessed/performed Overall Cognitive Status: Within Functional Limits for tasks assessed                                        Exercises      General Comments General comments (skin integrity, edema, etc.): Pt with continent void on toilet without physical assistance; does report she's voiding more frequently since admission      Pertinent Vitals/Pain Pain Assessment: Faces Faces Pain Scale: Hurts a little bit Pain Location: "butt cheeks" Pain Descriptors / Indicators: Discomfort;Sore Pain Intervention(s): Limited activity within patient's tolerance;Monitored during session    Home Living                      Prior Function            PT  Goals (current goals can now be found in the care plan section) Acute Rehab PT Goals Patient Stated Goal: to return home, have decreased pain PT Goal Formulation: With patient Time For Goal Achievement: 01/20/21 Potential to Achieve Goals: Good Progress towards PT goals: Progressing toward goals    Frequency    7x/week      PT Plan Frequency needs to be updated    Co-evaluation              AM-PAC PT "6 Clicks" Mobility   Outcome Measure  Help needed turning from your back  to your side while in a flat bed without using bedrails?: None Help needed moving from lying on your back to sitting on the side of a flat bed without using bedrails?: None Help needed moving to and from a bed to a chair (including a wheelchair)?: None Help needed standing up from a chair using your arms (e.g., wheelchair or bedside chair)?: None Help needed to walk in hospital room?: None Help needed climbing 3-5 steps with a railing? : A Little 6 Click Score: 23    End of Session Equipment Utilized During Treatment: Gait belt Activity Tolerance: Patient tolerated treatment well Patient left: in chair;with call bell/phone within reach   PT Visit Diagnosis: Muscle weakness (generalized) (M62.81);Pain;Difficulty in walking, not elsewhere classified (R26.2);Other abnormalities of gait and mobility (R26.89) Pain - part of body:  (buttocks)     Time: 2820-6015 PT Time Calculation (min) (ACUTE ONLY): 28 min  Charges:  $Therapeutic Activity: 23-37 mins                     Lavone Nian, PT, DPT 01/14/21, 12:08 PM    Waunita Schooner 01/14/2021, 12:08 PM

## 2021-01-14 NOTE — Plan of Care (Signed)

## 2021-01-14 NOTE — Discharge Summary (Signed)
Physician Discharge Summary  Alisha Sanchez YBW:[id number] DOB: Mar 29, 1956 DOA: 01/11/2021  PCP: [name], MD  Admit date: 01/11/2021 Discharge date: 01/14/2021  Admitted From: Home.  Disposition:  Home.   Recommendations for Outpatient Follow-up:  Follow up with PCP in 1-2 weeks Please obtain BMP/CBC in one week Please follow up with orthopedics in 2 weeks as recommended.    Discharge Condition: stable.  CODE STATUS:full code.  Diet recommendation: Heart Healthy   Brief/Interim Summary: Alisha Sanchez is a 65 y.o. female with medical history significant of chronic pain, diabetes, hypertension, GERD, history of PE, hyperlipidemia, migraine, hypothyroidism with a history of thyroid cancer, diverticulosis who presents with ongoing back pain following a fall. However MRI of the L-spine showed bilateral sacral insufficiency fractures and mild degenerative changes.  Orthopedics consulted and patient underwent sacroplasty on 01/12/2021.  Discharge Diagnoses:  Principal Problem:   Sacral insufficiency fracture Active Problems:   Diabetes ([place])   Hyperlipidemia   Essential hypertension   History of pulmonary embolus (PE)   Diabetes mellitus type 2, uncomplicated (HCC)   GERD (gastroesophageal reflux disease)   Thyroid cancer ([place])   Sacral insufficiency fracture S/p sacroplasty by orthopedics on 01/12/21 Pain control Physical therapy evaluations recommending Outpatient PT.        Hypertension Well controlled.        Type 2 diabetes mellitus Non-insulin-dependent well controlled CBG'S Resume home meds.      Hypothyroidism Continue with Synthroid.       Hyperlipidemia Continue with statin.     GERD Continue with PPI     Bilateral leg pain with cramps. She rpts having a h/o ofPE in the past. Venous duplex is negative for DVT.     Discharge Instructions   Allergies as of 01/14/2021   No Known Allergies      Medication List     STOP  taking these medications    aspirin 81 MG EC tablet   enoxaparin 40 MG/0.4ML injection Commonly known as: LOVENOX   ibuprofen 200 MG tablet Commonly known as: ADVIL   naproxen 500 MG tablet Commonly known as: Naprosyn       TAKE these medications    alendronate 70 MG tablet Commonly known as: FOSAMAX Take 70 mg by mouth once a week. Take with a full glass of water on an empty stomach.   amLODipine 5 MG tablet Commonly known as: NORVASC Take 5 mg by mouth at bedtime.   glipiZIDE 2.5 MG 24 hr tablet Commonly known as: GLUCOTROL XL Take 2.5 mg by mouth daily.   hydrochlorothiazide 25 MG tablet Commonly known as: HYDRODIURIL Take 25 mg by mouth daily.   HYDROcodone-acetaminophen 5-325 MG tablet Commonly known as: NORCO/VICODIN Take 1 tablet by mouth every 6 (six) hours as needed for up to 3 days.   Melatonin 10 MG Tabs Take 1 tablet by mouth at bedtime as needed.   MULTIVITAMIN ADULT PO Take 2 tablets by mouth daily.   pantoprazole 40 MG tablet Commonly known as: PROTONIX Take 40 mg by mouth daily.   polyethylene glycol 17 g packet Commonly known as: MIRALAX / GLYCOLAX Take 17 g by mouth daily as needed for mild constipation.   rosuvastatin 10 MG tablet Commonly known as: CRESTOR Take 10 mg by mouth at bedtime.   sitaGLIPtin-metformin 50-500 MG tablet Commonly known as: JANUMET Take 1 tablet by mouth 2 (two) times daily with a meal.   Synthroid 100 MCG tablet Generic drug: levothyroxine Take 100 mcg by mouth daily.  What changed: Another medication with the same name was removed. Continue taking this medication, and follow the directions you see here.   tiZANidine 4 MG tablet Commonly known as: Zanaflex Take 1 tablet (4 mg total) by mouth 3 (three) times daily.        Follow-up Information     Hessie Knows, MD Follow up in 2 week(s).   Specialty: Orthopedic Surgery Why: For wound check Contact information: [place] [phone] [phone]         [name], MD Follow up in 1 week(s).   Specialty: Family Medicine Contact information: [place] [address] [phone]                No Known Allergies  Consultations: Orthopedics.    Procedures/Studies: DG Chest 2 View  Result Date: 12/23/2020 CLINICAL DATA:  Chest pain after falling down steps today. EXAM: CHEST - 2 VIEW COMPARISON:  08/11/2013 FINDINGS: Heart size is normal. There is mild elevation of the RIGHT hemidiaphragm possibly related to hypoinflation. There is no pneumothorax. Suspect deformities of the anterior LEFT ribs 3, 4, 5, and possibly 6. There is no pleural effusion or consolidation. Significant degenerative changes are seen in the thoracic spine. Surgical clips are noted in the superior mediastinum. IMPRESSION: 1. Suspect LEFT rib fractures, of indeterminate age. Consider dedicated rib views if needed. 2. No pneumothorax. Electronically Signed   By: Nolon Nations M.D.   On: 12/23/2020 15:15   DG Lumbar Spine 2-3 Views  Result Date: 01/12/2021 CLINICAL DATA:  Sacroplasty EXAM: LUMBAR SPINE - 2-3 VIEW; DG C-ARM 1-60 MIN COMPARISON:  01/12/2021 FLUOROSCOPY TIME:  1 minutes 7 seconds Dose: 35.716 mGy Images: 3 FINDINGS: Lateral images only. Osseous demineralization. Images demonstrate injection of cement into the S1 and S2 segments of the sacrum. Some of the cement extends beyond osseous margins dorsally and overlying the posterior aspect of the L5-S1 disc space. IMPRESSION: Post sacroplasty as above. Electronically Signed   By: Lavonia Dana M.D.   On: 01/12/2021 12:59   DG Lumbar Spine 2-3 Views  Result Date: 12/23/2020 CLINICAL DATA:  65 year old female with fall and back pain. EXAM: LUMBAR SPINE - 2-3 VIEW COMPARISON:  None. FINDINGS: There is no acute fracture or dislocation. The bones are osteopenic. Multilevel degenerative changes and spurring. The visualized posterior  elements are intact. Lower lumbar facet arthropathy. The soft tissues are unremarkable. IMPRESSION: No acute/traumatic lumbar spine pathology. Electronically Signed   By: Anner Crete M.D.   On: 12/23/2020 16:08   CT Head Wo Contrast  Result Date: 12/23/2020 CLINICAL DATA:  Head trauma, minor (Age >= 65y) Fall down stairs. EXAM: CT HEAD WITHOUT CONTRAST TECHNIQUE: Contiguous axial images were obtained from the base of the skull through the vertex without intravenous contrast. COMPARISON:  Head CT 07/20/2011 FINDINGS: Brain: Brain volume is normal for age. No intracranial hemorrhage, mass effect, or midline shift. No hydrocephalus. The basilar cisterns are patent. No evidence of territorial infarct or acute ischemia. No extra-axial or intracranial fluid collection. Vascular: No hyperdense vessel. Skull: No skull fracture. There are multiple venous lakes on right knee granulations are stable from prior exam, benign. Frontal hyperostosis. Sinuses/Orbits: Assessed on concurrent face CT, reported separately. Other: None. IMPRESSION: No acute intracranial abnormality. No skull fracture. Electronically Signed   By: Keith Rake M.D.   On: 12/23/2020 16:21   CT Chest Wo Contrast  Result Date: 12/23/2020 CLINICAL DATA:  65 year old female  with chest trauma. EXAM: CT CHEST WITHOUT CONTRAST TECHNIQUE: Multidetector CT imaging of the chest was performed following the standard protocol without IV contrast. COMPARISON:  Chest CT dated 08/11/2013. FINDINGS: Evaluation of this exam is limited in the absence of intravenous contrast. Cardiovascular: There is no cardiomegaly or pericardial effusion. Mild atherosclerotic calcification of the thoracic aorta. The central pulmonary arteries are unremarkable. Mediastinum/Nodes: No hilar or mediastinal adenopathy. The esophagus is grossly unremarkable. Thyroidectomy. No mediastinal fluid collection. Lungs/Pleura: The lungs are clear. There is no pleural effusion  pneumothorax. The central airways are patent. Upper Abdomen: No acute abnormality. Musculoskeletal: Osteopenia with degenerative changes of the spine. Multiple old left rib fractures. No acute osseous pathology. IMPRESSION: No acute/traumatic intrathoracic pathology. Electronically Signed   By: Anner Crete M.D.   On: 12/23/2020 16:23   CT Cervical Spine Wo Contrast  Result Date: 12/23/2020 CLINICAL DATA:  Neck trauma (Age >= 65y) Fall down stairs. EXAM: CT CERVICAL SPINE WITHOUT CONTRAST TECHNIQUE: Multidetector CT imaging of the cervical spine was performed without intravenous contrast. Multiplanar CT image reconstructions were also generated. COMPARISON:  Cervical spine CT 03/06/2011 FINDINGS: Alignment: Mild chronic rightward scoliotic curvature of the lower cervical spine. No traumatic subluxation. Skull base and vertebrae: No acute fracture. Vertebral body heights are maintained. The dens and skull base are intact. Sclerotic focus within the right T1 is unchanged from prior exam and consistent with benign bone island. Soft tissues and spinal canal: No prevertebral fluid or swelling. No visible canal hematoma. Disc levels: Mild degenerative disc disease at C4-C5 and C5-C6. There is moderate multilevel facet hypertrophy. No high-grade canal stenosis. Upper chest: Thyroidectomy.  No acute findings. Other: None. IMPRESSION: 1. No acute fracture or subluxation of the cervical spine. 2. Mild degenerative disc disease with moderate facet hypertrophy. Electronically Signed   By: Keith Rake M.D.   On: 12/23/2020 16:27   MR THORACIC SPINE WO CONTRAST  Result Date: 01/11/2021 CLINICAL DATA:  Low back pain with worsening right leg pain and numbness since recent fall EXAM: MRI THORACIC SPINE WITHOUT CONTRAST TECHNIQUE: Multiplanar, multisequence MR imaging of the thoracic spine was performed. No intravenous contrast was administered. COMPARISON:  None. FINDINGS: Alignment:  No significant anteroposterior  listhesis. Vertebrae: There is no compression deformity. Vertebral body heights are maintained apart from mild degenerative endplate irregularity. There is no substantial marrow edema. No suspicious osseous lesion. Cord:  No abnormal signal. Paraspinal and other soft tissues: Unremarkable. Disc levels: Mild degenerative disc disease with small disc bulges or protrusions. Mild facet arthropathy. No significant canal or foraminal stenosis at any level. IMPRESSION: No compression fracture. No abnormal cord signal. No significant stenosis. Electronically Signed   By: Macy Mis M.D.   On: 01/11/2021 17:28   MR LUMBAR SPINE WO CONTRAST  Result Date: 01/11/2021 CLINICAL DATA:  Low back pain.  Cauda equina syndrome suspected. EXAM: MRI LUMBAR SPINE WITHOUT CONTRAST TECHNIQUE: Multiplanar, multisequence MR imaging of the lumbar spine was performed. No intravenous contrast was administered. COMPARISON:  None. FINDINGS: Segmentation:  Standard. Alignment:  Physiologic. Vertebrae: Bilateral sacral fracture with prominent marrow edema, consistent with acute/subacute process. No evidence of discitis or aggressive bone lesion. Conus medullaris and cauda equina: Conus extends to the L1 level. Conus and cauda equina appear normal. Paraspinal and other soft tissues: Negative. Disc levels: T12-L1: No spinal canal or neural foraminal stenosis. L1-2: Shallow disc bulge. No spinal canal or neural foraminal stenosis. L2-3: Shallow disc bulge with left foraminal annular tear and mild facet degenerative changes. No  significant spinal canal or neural foraminal stenosis. L3-4: Shallow disc bulge and hypertrophic facet degenerative changes without significant spinal canal or neural foraminal stenosis. L4-5: Mild facet degenerative changes. No spinal canal or neural foraminal stenosis. L5-S1: Shallow disc bulge, slightly asymmetric to the left and mild facet degenerative changes. No significant spinal canal or neural foraminal  stenosis. IMPRESSION: 1. 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 2. Mild degenerative changes of the lumbar spine without significant spinal canal or neural foraminal stenosis. 3. 4. These results were called by telephone at the time of interpretation on 01/11/2021 at 2:07 pm to provider [facility] , who verbally acknowledged these results. Electronically Signed   By: Pedro Earls M.D.   On: 01/11/2021 14:07   US Venous Img Lower Bilateral (DVT)  Result Date: 01/14/2021 CLINICAL DATA:  Leg pain EXAM: BILATERAL LOWER EXTREMITY VENOUS DOPPLER ULTRASOUND TECHNIQUE: Gray-scale sonography with compression, as well as color and duplex ultrasound, were performed to evaluate the deep venous system(s) from the level of the common femoral vein through the popliteal and proximal calf veins. COMPARISON:  None. FINDINGS: VENOUS Normal compressibility of the common femoral, superficial femoral, and popliteal veins, as well as the visualized calf veins. Visualized portions of profunda femoral vein and great saphenous vein unremarkable. No filling defects to suggest DVT on grayscale or color Doppler imaging. Doppler waveforms show normal direction of venous flow, normal respiratory phasicity and response to augmentation. OTHER None. Limitations: none IMPRESSION: No femoropopliteal DVT nor evidence of DVT within the visualized calf veins. If clinical symptoms are inconsistent or if there are persistent or worsening symptoms, further imaging (possibly involving the iliac veins) may be warranted. Electronically Signed   By: Lucrezia Europe M.D.   On: 01/14/2021 08:21   DG C-Arm 1-60 Min  Result Date: 01/12/2021 CLINICAL DATA:  Sacroplasty EXAM: LUMBAR SPINE - 2-3 VIEW; DG C-ARM 1-60 MIN COMPARISON:  01/12/2021 FLUOROSCOPY TIME:  1 minutes 7 seconds Dose: 35.716 mGy Images: 3 FINDINGS: Lateral images only. Osseous demineralization. Images demonstrate injection of cement into the S1 and  S2 segments of the sacrum. Some of the cement extends beyond osseous margins dorsally and overlying the posterior aspect of the L5-S1 disc space. IMPRESSION: Post sacroplasty as above. Electronically Signed   By: Lavonia Dana M.D.   On: 01/12/2021 12:59   DG Hip Unilat W or Wo Pelvis 2-3 Views Right  Result Date: 01/11/2021 CLINICAL DATA:  Status post fall down stairs 12/23/2020. Pelvic pain. Known sacral fractures. EXAM: DG HIP (WITH OR WITHOUT PELVIS) 2-3V RIGHT COMPARISON:  MRI lumbar spine today. FINDINGS: The patient's sacral fractures seen on MRI are difficult to visualize on this examination. No other fracture is identified. The hips are located. Joint spaces are preserved. IMPRESSION: Sacral fracture seen on MRI are difficult to visualize on this exam. No acute bony abnormality is seen. Electronically Signed   By: Inge Rise M.D.   On: 01/11/2021 15:04   CT Maxillofacial Wo Contrast  Result Date: 12/23/2020 CLINICAL DATA:  Facial trauma Fall down stairs. EXAM: CT MAXILLOFACIAL WITHOUT CONTRAST TECHNIQUE: Multidetector CT imaging of the maxillofacial structures was performed. Multiplanar CT image reconstructions were also generated. COMPARISON:  None. FINDINGS: Osseous: No acute facial bone fracture. Nasal bone, zygomatic arches, and mandibles are intact. The temporomandibular joints are congruent. There is trace leftward nasal septal deviation. Orbits: No orbital fracture or globe injury. Bilateral cataract resection. Sinuses: No sinus fracture or fluid level. Mastoid air cells are clear. Soft tissues:  Negative. Limited intracranial: Assessed on concurrent head CT, reported separately. IMPRESSION: No acute facial bone fracture. Electronically Signed   By: Keith Rake M.D.   On: 12/23/2020 16:23      Subjective: No new complaints.   Discharge Exam: Vitals:   01/14/21 0405 01/14/21 1351  BP: 129/73 125/73  Pulse: 63 69  Resp: 19 16  Temp: 97.6 F (36.4 C) 98 F (36.7 C)  SpO2:  98% 97%   Vitals:   01/13/21 2005 01/13/21 2344 01/14/21 0405 01/14/21 1351  BP: 124/68 133/75 129/73 125/73  Pulse: 70 67 63 69  Resp: 20 19 19 16   Temp: (!) 97.4 F (36.3 C) 97.7 F (36.5 C) 97.6 F (36.4 C) 98 F (36.7 C)  TempSrc:      SpO2: 95% 97% 98% 97%  Weight:      Height:        General: Pt is alert, awake, not in acute distress Cardiovascular: RRR, S1/S2 +, no rubs, no gallops Respiratory: CTA bilaterally, no wheezing, no rhonchi Abdominal: Soft, NT, ND, bowel sounds + Extremities: no edema, no cyanosis    The results of significant diagnostics from this hospitalization (including imaging, microbiology, ancillary and laboratory) are listed below for reference.     Microbiology: Recent Results (from the past 240 hour(s))  Resp Panel by RT-PCR (Flu A&B, Covid) Nasopharyngeal Swab     Status: None   Collection Time: 01/11/21  7:30 PM   Specimen: Nasopharyngeal Swab; Nasopharyngeal(NP) swabs in vial transport medium  Result Value Ref Range Status   SARS Coronavirus 2 by RT PCR NEGATIVE NEGATIVE Final    Comment: (NOTE) SARS-CoV-2 target nucleic acids are NOT DETECTED.  The SARS-CoV-2 RNA is generally detectable in upper respiratory specimens during the acute phase of infection. The lowest concentration of SARS-CoV-2 viral copies this assay can detect is 138 copies/mL. A negative result does not preclude SARS-Cov-2 infection and should not be used as the sole basis for treatment or other patient management decisions. A negative result may occur with  improper specimen collection/handling, submission of specimen other than nasopharyngeal swab, presence of viral mutation(s) within the areas targeted by this assay, and inadequate number of viral copies(<138 copies/mL). A negative result must be combined with clinical observations, patient history, and epidemiological information. The expected result is Negative.  Fact Sheet for Patients:   EntrepreneurPulse.com.au  Fact Sheet for Healthcare Providers:  IncredibleEmployment.be  This test is no t yet approved or cleared by the [place] FDA and  has been authorized for detection and/or diagnosis of SARS-CoV-2 by FDA under an Emergency Use Authorization (EUA). This EUA will remain  in effect (meaning this test can be used) for the duration of the COVID-19 declaration under Section 564(b)(1) of the Act, 21 U.S.C.section 360bbb-3(b)(1), unless the authorization is terminated  or revoked sooner.       Influenza A by PCR NEGATIVE NEGATIVE Final   Influenza B by PCR NEGATIVE NEGATIVE Final    Comment: (NOTE) The Xpert Xpress SARS-CoV-2/FLU/RSV plus assay is intended as an aid in the diagnosis of influenza from Nasopharyngeal swab specimens and should not be used as a sole basis for treatment. Nasal washings and aspirates are unacceptable for Xpert Xpress SARS-CoV-2/FLU/RSV testing.  Fact Sheet for Patients: EntrepreneurPulse.com.au  Fact Sheet for Healthcare Providers: IncredibleEmployment.be  This test is not yet approved or cleared by the [place] FDA and has been authorized for detection and/or diagnosis of SARS-CoV-2 by FDA under an Emergency Use Authorization (EUA). This  EUA will remain in effect (meaning this test can be used) for the duration of the COVID-19 declaration under Section 564(b)(1) of the Act, 21 U.S.C. section 360bbb-3(b)(1), unless the authorization is terminated or revoked.  Performed at [facility], [address] [place], [place] [address]      Labs: BNP (last 3 results) No results for input(s): BNP in the last 8760 hours. Basic Metabolic Panel: Recent Labs  Lab 01/11/21 2014 01/12/21 0536  NA 139 140  K 4.0 3.5  CL 104 104  CO2 25 26  GLUCOSE 100* 114*  BUN 18 19  CREATININE 0.55 0.54  CALCIUM 9.3 9.1   Liver Function Tests: Recent  Labs  Lab 01/11/21 2014 01/12/21 0536  AST 24 14*  ALT 13 10  ALKPHOS 96 94  BILITOT 1.5* 1.2  PROT 8.2* 7.6  ALBUMIN 4.2 4.0   No results for input(s): LIPASE, AMYLASE in the last 168 hours. No results for input(s): AMMONIA in the last 168 hours. CBC: Recent Labs  Lab 01/11/21 2014 01/12/21 0536 01/13/21 0612  WBC 7.6 6.0 6.9  NEUTROABS 5.5  --   --   HGB 13.5 11.9* 11.5*  HCT 40.4 36.0 36.0  MCV 84.7 84.3 88.9  PLT 265 351 253   Cardiac Enzymes: No results for input(s): CKTOTAL, CKMB, CKMBINDEX, TROPONINI in the last 168 hours. BNP: Invalid input(s): POCBNP CBG: Recent Labs  Lab 01/13/21 1228 01/13/21 1658 01/13/21 2128 01/14/21 0837 01/14/21 1253  GLUCAP 148* 157* 108* 160* 154*   D-Dimer No results for input(s): DDIMER in the last 72 hours. Hgb A1c Recent Labs    01/12/21 0516  HGBA1C 6.5*   Lipid Profile No results for input(s): CHOL, HDL, LDLCALC, TRIG, CHOLHDL, LDLDIRECT in the last 72 hours. Thyroid function studies No results for input(s): TSH, T4TOTAL, T3FREE, THYROIDAB in the last 72 hours.  Invalid input(s): FREET3 Anemia work up No results for input(s): VITAMINB12, FOLATE, FERRITIN, TIBC, IRON, RETICCTPCT in the last 72 hours. Urinalysis    Component Value Date/Time   COLORURINE YELLOW (A) 01/11/2021 1617   APPEARANCEUR CLEAR (A) 01/11/2021 1617   LABSPEC 1.013 01/11/2021 1617   PHURINE 7.0 01/11/2021 1617   GLUCOSEU 50 (A) 01/11/2021 1617   HGBUR NEGATIVE 01/11/2021 1617   BILIRUBINUR NEGATIVE 01/11/2021 1617   KETONESUR NEGATIVE 01/11/2021 1617   PROTEINUR NEGATIVE 01/11/2021 1617   UROBILINOGEN 0.2 07/22/2007 0917   NITRITE NEGATIVE 01/11/2021 1617   LEUKOCYTESUR TRACE (A) 01/11/2021 1617   Sepsis Labs Invalid input(s): PROCALCITONIN,  WBC,  LACTICIDVEN Microbiology Recent Results (from the past 240 hour(s))  Resp Panel by RT-PCR (Flu A&B, Covid) Nasopharyngeal Swab     Status: None   Collection Time: 01/11/21  7:30 PM    Specimen: Nasopharyngeal Swab; Nasopharyngeal(NP) swabs in vial transport medium  Result Value Ref Range Status   SARS Coronavirus 2 by RT PCR NEGATIVE NEGATIVE Final    Comment: (NOTE) SARS-CoV-2 target nucleic acids are NOT DETECTED.  The SARS-CoV-2 RNA is generally detectable in upper respiratory specimens during the acute phase of infection. The lowest concentration of SARS-CoV-2 viral copies this assay can detect is 138 copies/mL. A negative result does not preclude SARS-Cov-2 infection and should not be used as the sole basis for treatment or other patient management decisions. A negative result may occur with  improper specimen collection/handling, submission of specimen other than nasopharyngeal swab, presence of viral mutation(s) within the areas targeted by this assay, and inadequate number of viral copies(<138 copies/mL). A  negative result must be combined with clinical observations, patient history, and epidemiological information. The expected result is Negative.  Fact Sheet for Patients:  EntrepreneurPulse.com.au  Fact Sheet for Healthcare Providers:  IncredibleEmployment.be  This test is no t yet approved or cleared by the [place] FDA and  has been authorized for detection and/or diagnosis of SARS-CoV-2 by FDA under an Emergency Use Authorization (EUA). This EUA will remain  in effect (meaning this test can be used) for the duration of the COVID-19 declaration under Section 564(b)(1) of the Act, 21 U.S.C.section 360bbb-3(b)(1), unless the authorization is terminated  or revoked sooner.       Influenza A by PCR NEGATIVE NEGATIVE Final   Influenza B by PCR NEGATIVE NEGATIVE Final    Comment: (NOTE) The Xpert Xpress SARS-CoV-2/FLU/RSV plus assay is intended as an aid in the diagnosis of influenza from Nasopharyngeal swab specimens and should not be used as a sole basis for treatment. Nasal washings and aspirates are  unacceptable for Xpert Xpress SARS-CoV-2/FLU/RSV testing.  Fact Sheet for Patients: EntrepreneurPulse.com.au  Fact Sheet for Healthcare Providers: IncredibleEmployment.be  This test is not yet approved or cleared by the [place] FDA and has been authorized for detection and/or diagnosis of SARS-CoV-2 by FDA under an Emergency Use Authorization (EUA). This EUA will remain in effect (meaning this test can be used) for the duration of the COVID-19 declaration under Section 564(b)(1) of the Act, 21 U.S.C. section 360bbb-3(b)(1), unless the authorization is terminated or revoked.  Performed at [facility], [address] [place], [place] [address]      Time coordinating discharge: 36 minutes.   SIGNED:   Hosie Poisson, MD  [facility] 01/14/2021, 2:13 PM

## 2021-09-23 ENCOUNTER — Encounter: Payer: Self-pay | Admitting: *Deleted

## 2021-12-19 ENCOUNTER — Other Ambulatory Visit: Payer: Self-pay | Admitting: Family Medicine

## 2021-12-19 DIAGNOSIS — Z1231 Encounter for screening mammogram for malignant neoplasm of breast: Secondary | ICD-10-CM

## 2022-01-02 ENCOUNTER — Encounter: Payer: Self-pay | Admitting: Urology

## 2022-01-02 ENCOUNTER — Ambulatory Visit: Payer: Medicare PPO | Admitting: Urology

## 2022-01-02 VITALS — BP 124/71 | HR 79 | Ht 66.5 in | Wt 184.4 lb

## 2022-01-02 DIAGNOSIS — N390 Urinary tract infection, site not specified: Secondary | ICD-10-CM

## 2022-01-02 DIAGNOSIS — N958 Other specified menopausal and perimenopausal disorders: Secondary | ICD-10-CM

## 2022-01-02 DIAGNOSIS — N3281 Overactive bladder: Secondary | ICD-10-CM

## 2022-01-02 LAB — MICROSCOPIC EXAMINATION

## 2022-01-02 LAB — URINALYSIS, COMPLETE
Bilirubin, UA: NEGATIVE
Glucose, UA: NEGATIVE
Ketones, UA: NEGATIVE
Leukocytes,UA: NEGATIVE
Nitrite, UA: POSITIVE — AB
Protein,UA: NEGATIVE
RBC, UA: NEGATIVE
Specific Gravity, UA: 1.03 (ref 1.005–1.030)
Urobilinogen, Ur: 0.2 mg/dL (ref 0.2–1.0)
pH, UA: 5 (ref 5.0–7.5)

## 2022-01-02 LAB — BLADDER SCAN AMB NON-IMAGING

## 2022-01-02 MED ORDER — OXYBUTYNIN CHLORIDE ER 10 MG PO TB24: 10.0000 mg | ORAL_TABLET | Freq: Every day | ORAL | 11 refills | Status: DC

## 2022-01-02 MED ORDER — ESTRADIOL 0.1 MG/GM VA CREA: TOPICAL_CREAM | VAGINAL | 11 refills | Status: DC

## 2022-01-02 NOTE — Progress Notes (Signed)
01/02/22 1:53 PM   Alisha Sanchez May 04, 1956 [id number]  CC: Urgency, frequency, nocturia, recurrent UTI  HPI: I saw Ms. Alisha Sanchez today for primarily urinary frequency during the day and nocturia 4 times overnight.  She has also had problems with recurrent UTIs with reportedly 4 in the last year.  Those culture results are not available to me.  She denies any significant stress incontinence.  She drinks 1-1/2 cups of coffee a day.  She has some urgency but rare urge incontinence, and primary bother is the nocturia.  She denies any gross hematuria or dysuria.  Her husband has told her that she snores, she has never been evaluated for sleep apnea.  She was reportedly recently treated for UTI  Urinalysis today 0-5 WBCs, 0-2 RBCs, many bacteria, nitrite positive, negative leukocytes, PVR is normal at 12m.  PMH: Past Medical History:  Diagnosis Date   Arthritis    knees   Cancer ([facility]    Thyroid   Complication of anesthesia    Depression    Diabetes mellitus type 2 in obese (HCC)    Dyspnea    GERD (gastroesophageal reflux disease)    Headache    migraines - 3-4x/yr   HLD (hyperlipidemia)    HTN (hypertension)    Hypothyroidism    no thyroid   Localized swelling of both lower legs    leaky veins   Lower extremity edema    Neck pain    left side   Numbness and tingling    numbness in hands and face tingles   PONV (postoperative nausea and vomiting)    Pulmonary embolus ([facility] [place]    after thyroid surgery   Vertigo    no episodes in approx 2 yrs    Surgical History: Past Surgical History:  Procedure Laterality Date   c-sections     x2    CATARACT EXTRACTION W/PHACO Left 08/27/2017   Procedure: CATARACT EXTRACTION PHACO AND INTRAOCULAR LENS PLACEMENT ([place] DIABET[facility];  Surgeon: BLeandrew Koyanagi MD;  [facility]  Service: Ophthalmology;  Laterality: Left;  Diabetic - oral meds   COLONOSCOPY N/A 11/10/2015   Procedure: COLONOSCOPY;  Surgeon:  PHulen Luster MD;  [facility]  Service: Gastroenterology;  Laterality: N/A;   ESOPHAGOGASTRODUODENOSCOPY N/A 11/10/2015   Procedure: ESOPHAGOGASTRODUODENOSCOPY (EGD) with dialation;  Surgeon: PHulen Luster MD;  [facility]  Service: Gastroenterology;  Laterality: N/A;   IVC FILTER INSERTION N/A 03/17/2019   Procedure: IVC FILTER INSERTION;  Surgeon: DAlgernon Huxley MD;  [facility];  Service: Cardiovascular;  Laterality: N/A;   IVC FILTER REMOVAL N/A 06/05/2019   Procedure: IVC FILTER REMOVAL;  Surgeon: DAlgernon Huxley MD;  [facility];  Service: Cardiovascular;  Laterality: N/A;   KNEE ARTHROPLASTY Left 03/24/2019   Procedure: COMPUTER ASSISTED TOTAL KNEE ARTHROPLASTY - RNFA;  Surgeon: HDereck Leep MD;  Location: [facility];  Service: Orthopedics;  Laterality: Left;   SACROPLASTY N/A 01/12/2021   Procedure: SACROPLASTY;  Surgeon: MHessie Knows MD;  Location: [facility];  Service: Orthopedics;  Laterality: N/A;   SHOULDER ARTHROSCOPY Left 11/15/2018   Procedure: ARTHROSCOPY SHOULDER WITH DEBRIDEMENT DECOMPRESSION, ROTATOR CUFF REPAIR AND POSSIBLE BICEPS TENODESIS;  Surgeon: PCorky Mull MD;  [place] [facility]  Service: Orthopedics;  Laterality: Left;  SMITH AND NEPHEW REGENTEN PATCH   THYROIDECTOMY       Family History: Family History  Problem Relation Age of Onset   Liver  cancer Father    Lung cancer Father    Diabetes Father    Stroke Other    Breast cancer Maternal Aunt 32   Breast cancer Paternal Aunt 60    Social History:  reports that she has never smoked. She has never used smokeless tobacco. She reports current alcohol use of about 1.0 standard drink of alcohol per week. She reports that she does not use drugs.  Physical Exam: BP 124/71 (BP Location: Left Arm, Patient Position: Sitting, Cuff Size: Large)   Pulse 79   Ht 5' 6.5" (1.689 m)   Wt 184 lb 6.4 oz (83.6 kg)   BMI 29.32 kg/m     Constitutional:  Alert and oriented, No acute distress. Cardiovascular: No clubbing, cyanosis, or edema. Respiratory: Normal respiratory effort, no increased work of breathing. GI: Abdomen is soft, nontender, nondistended, no abdominal masses   Laboratory Data: Reviewed  Assessment & Plan:   66 year old female with urinary frequency during the day and bothersome nocturia 4 times at night, as well as recurrent UTIs.  Suspect combination of OAB and genitourinary syndrome of menopause.  We discussed that overactive bladder (OAB) is not a disease, but is a symptom complex that is generally not life-threatening.  Symptoms typically include urinary urgency, frequency, and urge incontinence.  There are numerous treatment options, however there are risks and benefits with both medical and surgical management.  First-line treatment is behavioral therapies including bladder training, pelvic floor muscle training, and fluid management.  Second line treatments include oral antimuscarinics(Ditropan er, Trospium) and beta-3 agonist (Mybetriq). There is typically a period of medication trial (4-8 weeks) to find the optimal therapy and dosing. If symptoms are bothersome despite the above management, third line options include intra-detrusor botox, peripheral tibial nerve stimulation (PTNS), and interstim (SNS). These are more invasive treatments with higher side effect profile, but may improve quality of life for patients with severe OAB symptoms.   Trial of oxybutynin 10 mg XL for OAB Topical estrogen cream for recurrent infections Recommended sleep apnea evaluation RTC 6 weeks symptom check  Alisha Madrid, MD 01/02/2022  [facility] [address] [place], [place] [address] [phone]

## 2022-01-02 NOTE — Patient Instructions (Signed)

## 2022-01-11 ENCOUNTER — Ambulatory Visit
Admission: RE | Admit: 2022-01-11 | Discharge: 2022-01-11 | Disposition: A | Discharge status: Home / Self Care | Payer: Medicare PPO | Source: Ambulatory Visit | Attending: Family Medicine | Admitting: Family Medicine

## 2022-01-11 DIAGNOSIS — Z1231 Encounter for screening mammogram for malignant neoplasm of breast: Secondary | ICD-10-CM | POA: Insufficient documentation

## 2022-02-15 ENCOUNTER — Encounter: Payer: Self-pay | Admitting: Urology

## 2022-02-15 ENCOUNTER — Ambulatory Visit: Payer: Medicare PPO | Admitting: Urology

## 2022-02-15 VITALS — BP 118/73 | HR 79 | Ht 67.0 in | Wt 184.0 lb

## 2022-02-15 DIAGNOSIS — N3281 Overactive bladder: Secondary | ICD-10-CM | POA: Diagnosis not present

## 2022-02-15 DIAGNOSIS — R351 Nocturia: Secondary | ICD-10-CM

## 2022-02-15 DIAGNOSIS — N390 Urinary tract infection, site not specified: Secondary | ICD-10-CM

## 2022-02-15 DIAGNOSIS — Z87440 Personal history of urinary (tract) infections: Secondary | ICD-10-CM | POA: Diagnosis not present

## 2022-02-15 NOTE — Progress Notes (Signed)
   02/15/2022 2:03 PM   Alisha Sanchez 02-Sep-1955 [id number]  Reason for visit: Follow up overactive bladder, recurrent UTI  HPI: 66 year old female who I saw in July 2023 for overactive bladder symptoms of urgency, frequency, nocturia 4 times overnight.  She is also had problems with recurrent UTIs.  We started topical estrogen cream for her recurrent infections and started oxybutynin 10 mg XL daily.  She is taking this in the evening.  She has had complete resolution of urinary symptoms during the day, but continues to have nocturia 4 times overnight.  I again strongly recommended sleep apnea evaluation, and she will reach out to her PCP to coordinate.  We also reviewed behavioral strategies for her nocturia and avoiding bladder irritants.  Continue oxybutynin 10 mg XL  Strongly recommended sleep apnea evaluation as etiology of her nocturia RTC 6 months  Billey Co, [place] [address], [place],  [address] [phone]

## 2022-02-15 NOTE — Patient Instructions (Signed)
Minimize fluids 3 to 4 hours before bed, try to urinate at least once right before getting in bed.  Coffee, soda, tea, diet drinks can all irritate your bladder and increased urination in the evening.  Also recommend getting a sleep study to evaluate for sleep apnea, as this is a very common reason people have overnight urination.  Sleep Apnea Sleep apnea is a condition in which breathing pauses or becomes shallow during sleep. People with sleep apnea usually snore loudly. They may have times when they gasp and stop breathing for 10 seconds or more during sleep. This may happen many times during the night. Sleep apnea disrupts your sleep and keeps your body from getting the rest that it needs. This condition can increase your risk of certain health problems, including: Heart attack. Stroke. Obesity. Type 2 diabetes. Heart failure. Irregular heartbeat. High blood pressure. The goal of treatment is to help you breathe normally again. What are the causes?  The most common cause of sleep apnea is a collapsed or blocked airway. There are three kinds of sleep apnea: Obstructive sleep apnea. This kind is caused by a blocked or collapsed airway. Central sleep apnea. This kind happens when the part of the brain that controls breathing does not send the correct signals to the muscles that control breathing. Mixed sleep apnea. This is a combination of obstructive and central sleep apnea. What increases the risk? You are more likely to develop this condition if you: Are overweight. Smoke. Have a smaller than normal airway. Are older. Are female. Drink alcohol. Take sedatives or tranquilizers. Have a family history of sleep apnea. Have a tongue or tonsils that are larger than normal. What are the signs or symptoms? Symptoms of this condition include: Trouble staying asleep. Loud snoring. Morning headaches. Waking up gasping. Dry mouth or sore throat in the morning. Daytime sleepiness and  tiredness. If you have daytime fatigue because of sleep apnea, you may be more likely to have: Trouble concentrating. Forgetfulness. Irritability or mood swings. Personality changes. Feelings of depression. Sexual dysfunction. This may include loss of interest if you are female, or erectile dysfunction if you are female. How is this diagnosed? This condition may be diagnosed with: A medical history. A physical exam. A series of tests that are done while you are sleeping (sleep study). These tests are usually done in a sleep lab, but they may also be done at home. How is this treated? Treatment for this condition aims to restore normal breathing and to ease symptoms during sleep. It may involve managing health issues that can affect breathing, such as high blood pressure or obesity. Treatment may include: Sleeping on your side. Using a decongestant if you have nasal congestion. Avoiding the use of depressants, including alcohol, sedatives, and narcotics. Losing weight if you are overweight. Making changes to your diet. Quitting smoking. Using a device to open your airway while you sleep, such as: An oral appliance. This is a custom-made mouthpiece that shifts your lower jaw forward. A continuous positive airway pressure (CPAP) device. This device blows air through a mask when you breathe out (exhale). A nasal expiratory positive airway pressure (EPAP) device. This device has valves that you put into each nostril. A bi-level positive airway pressure (BIPAP) device. This device blows air through a mask when you breathe in (inhale) and breathe out (exhale). Having surgery if other treatments do not work. During surgery, excess tissue is removed to create a wider airway. Follow these instructions at home: Lifestyle  Make any lifestyle changes that your health care provider recommends. Eat a healthy, well-balanced diet. Take steps to lose weight if you are overweight. Avoid using depressants,  including alcohol, sedatives, and narcotics. Do not use any products that contain nicotine or tobacco. These products include cigarettes, chewing tobacco, and vaping devices, such as e-cigarettes. If you need help quitting, ask your health care provider. General instructions Take over-the-counter and prescription medicines only as told by your health care provider. If you were given a device to open your airway while you sleep, use it only as told by your health care provider. If you are having surgery, make sure to tell your health care provider you have sleep apnea. You may need to bring your device with you. Keep all follow-up visits. This is important. Contact a health care provider if: The device that you received to open your airway during sleep is uncomfortable or does not seem to be working. Your symptoms do not improve. Your symptoms get worse. Get help right away if: You develop: Chest pain. Shortness of breath. Discomfort in your back, arms, or stomach. You have: Trouble speaking. Weakness on one side of your body. Drooping in your face. These symptoms may represent a serious problem that is an emergency. Do not wait to see if the symptoms will go away. Get medical help right away. Call your local emergency services (911 in the [place] Do not drive yourself to the hospital. Summary Sleep apnea is a condition in which breathing pauses or becomes shallow during sleep. The most common cause is a collapsed or blocked airway. The goal of treatment is to restore normal breathing and to ease symptoms during sleep. This information is not intended to replace advice given to you by your health care provider. Make sure you discuss any questions you have with your health care provider. Document Revised: 01/26/2021 Document Reviewed: 05/28/2020 Elsevier Patient Education  [employer].

## 2022-05-01 ENCOUNTER — Encounter (INDEPENDENT_AMBULATORY_CARE_PROVIDER_SITE_OTHER): Payer: Self-pay

## 2022-07-29 ENCOUNTER — Other Ambulatory Visit
Admission: RE | Admit: 2022-07-29 | Discharge: 2022-07-29 | Disposition: A | Discharge status: Home / Self Care | Payer: HMO | Source: Ambulatory Visit | Attending: Internal Medicine | Admitting: Internal Medicine

## 2022-07-29 DIAGNOSIS — F5102 Adjustment insomnia: Secondary | ICD-10-CM | POA: Diagnosis not present

## 2022-07-29 DIAGNOSIS — R5383 Other fatigue: Secondary | ICD-10-CM | POA: Insufficient documentation

## 2022-07-29 DIAGNOSIS — R5381 Other malaise: Secondary | ICD-10-CM | POA: Diagnosis not present

## 2022-07-29 DIAGNOSIS — E119 Type 2 diabetes mellitus without complications: Secondary | ICD-10-CM | POA: Diagnosis not present

## 2022-07-29 DIAGNOSIS — F4320 Adjustment disorder, unspecified: Secondary | ICD-10-CM | POA: Diagnosis not present

## 2022-07-29 DIAGNOSIS — M25511 Pain in right shoulder: Secondary | ICD-10-CM | POA: Diagnosis not present

## 2022-07-29 LAB — COMPREHENSIVE METABOLIC PANEL
ALT: 14 U/L (ref 0–44)
AST: 22 U/L (ref 15–41)
Albumin: 4.4 g/dL (ref 3.5–5.0)
Alkaline Phosphatase: 48 U/L (ref 38–126)
Anion gap: 11 (ref 5–15)
BUN: 15 mg/dL (ref 8–23)
CO2: 25 mmol/L (ref 22–32)
Calcium: 9.2 mg/dL (ref 8.9–10.3)
Chloride: 99 mmol/L (ref 98–111)
Creatinine, Ser: 0.86 mg/dL (ref 0.44–1.00)
GFR, Estimated: >60 mL/min (ref >60)
Glucose, Bld: 133 mg/dL — ABNORMAL HIGH (ref 70–99)
Potassium: 3.6 mmol/L (ref 3.5–5.1)
Sodium: 135 mmol/L (ref 135–145)
Total Bilirubin: 0.8 mg/dL (ref 0.3–1.2)
Total Protein: 7.8 g/dL (ref 6.5–8.1)

## 2022-07-31 ENCOUNTER — Other Ambulatory Visit: Payer: Self-pay | Admitting: Urology

## 2022-08-03 DIAGNOSIS — R059 Cough, unspecified: Secondary | ICD-10-CM | POA: Diagnosis not present

## 2022-08-03 DIAGNOSIS — Z20822 Contact with and (suspected) exposure to covid-19: Secondary | ICD-10-CM | POA: Diagnosis not present

## 2022-08-07 DIAGNOSIS — S46912A Strain of unspecified muscle, fascia and tendon at shoulder and upper arm level, left arm, initial encounter: Secondary | ICD-10-CM | POA: Diagnosis not present

## 2022-08-17 ENCOUNTER — Ambulatory Visit: Payer: Medicare PPO | Admitting: Urology

## 2022-08-29 ENCOUNTER — Encounter: Payer: Self-pay | Admitting: Urology

## 2022-08-29 ENCOUNTER — Ambulatory Visit (INDEPENDENT_AMBULATORY_CARE_PROVIDER_SITE_OTHER): Payer: HMO | Admitting: Urology

## 2022-08-29 VITALS — BP 111/67 | Ht 67.0 in | Wt 176.8 lb

## 2022-08-29 DIAGNOSIS — Z87440 Personal history of urinary (tract) infections: Secondary | ICD-10-CM | POA: Diagnosis not present

## 2022-08-29 DIAGNOSIS — N958 Other specified menopausal and perimenopausal disorders: Secondary | ICD-10-CM | POA: Diagnosis not present

## 2022-08-29 DIAGNOSIS — N3281 Overactive bladder: Secondary | ICD-10-CM

## 2022-08-29 DIAGNOSIS — N390 Urinary tract infection, site not specified: Secondary | ICD-10-CM

## 2022-08-29 MED ORDER — OXYBUTYNIN CHLORIDE ER 10 MG PO TB24: 10.0000 mg | ORAL_TABLET | Freq: Every day | ORAL | 3 refills | Status: DC

## 2022-08-29 MED ORDER — ESTRADIOL 0.1 MG/GM VA CREA: TOPICAL_CREAM | VAGINAL | 11 refills | Status: DC

## 2022-08-29 NOTE — Progress Notes (Signed)
   08/29/2022 11:00 AM   JAUNITA SZCZEPANIAK June 11, 1956 [id number]  Reason for visit: Follow up overactive bladder, recurrent UTI  HPI: 67 year old female who I saw in July 2023 for overactive bladder symptoms of urgency, frequency, nocturia 4 times overnight.  She is also had problems with recurrent UTIs.  We started topical estrogen cream for her recurrent infections and started oxybutynin 10 mg XL daily.  She is taking this in the evening.    She has done extremely well on those medications, and denies any significant urinary symptoms of urgency/frequency/urge incontinence, nocturia has improved to 0-1 time overnight.  She has not had any infections since starting the topical estrogen cream.  Continue oxybutynin 10 mg XL and topical estrogen cream RTC 1 year  Billey Co, MD  [facility] [address] [place], [place] [address] [phone]

## 2022-09-05 DIAGNOSIS — M25512 Pain in left shoulder: Secondary | ICD-10-CM | POA: Diagnosis not present

## 2022-09-18 DIAGNOSIS — E119 Type 2 diabetes mellitus without complications: Secondary | ICD-10-CM | POA: Diagnosis not present

## 2022-09-18 DIAGNOSIS — M5030 Other cervical disc degeneration, unspecified cervical region: Secondary | ICD-10-CM | POA: Diagnosis not present

## 2022-09-18 DIAGNOSIS — M75111 Incomplete rotator cuff tear or rupture of right shoulder, not specified as traumatic: Secondary | ICD-10-CM | POA: Diagnosis not present

## 2022-09-18 DIAGNOSIS — M7582 Other shoulder lesions, left shoulder: Secondary | ICD-10-CM | POA: Diagnosis not present

## 2022-09-18 DIAGNOSIS — E1165 Type 2 diabetes mellitus with hyperglycemia: Secondary | ICD-10-CM | POA: Diagnosis not present

## 2022-09-18 DIAGNOSIS — M542 Cervicalgia: Secondary | ICD-10-CM | POA: Diagnosis not present

## 2022-09-19 DIAGNOSIS — E039 Hypothyroidism, unspecified: Secondary | ICD-10-CM | POA: Diagnosis not present

## 2022-09-19 DIAGNOSIS — E119 Type 2 diabetes mellitus without complications: Secondary | ICD-10-CM | POA: Diagnosis not present

## 2022-09-19 DIAGNOSIS — I10 Essential (primary) hypertension: Secondary | ICD-10-CM | POA: Diagnosis not present

## 2022-09-26 DIAGNOSIS — I10 Essential (primary) hypertension: Secondary | ICD-10-CM | POA: Diagnosis not present

## 2022-09-26 DIAGNOSIS — E1165 Type 2 diabetes mellitus with hyperglycemia: Secondary | ICD-10-CM | POA: Diagnosis not present

## 2022-09-26 DIAGNOSIS — D649 Anemia, unspecified: Secondary | ICD-10-CM | POA: Diagnosis not present

## 2022-09-26 DIAGNOSIS — E538 Deficiency of other specified B group vitamins: Secondary | ICD-10-CM | POA: Diagnosis not present

## 2022-09-26 DIAGNOSIS — E039 Hypothyroidism, unspecified: Secondary | ICD-10-CM | POA: Diagnosis not present

## 2022-09-26 DIAGNOSIS — Z0000 Encounter for general adult medical examination without abnormal findings: Secondary | ICD-10-CM | POA: Diagnosis not present

## 2022-09-26 DIAGNOSIS — R5383 Other fatigue: Secondary | ICD-10-CM | POA: Diagnosis not present

## 2022-09-26 DIAGNOSIS — C73 Malignant neoplasm of thyroid gland: Secondary | ICD-10-CM | POA: Diagnosis not present

## 2022-09-26 DIAGNOSIS — R5381 Other malaise: Secondary | ICD-10-CM | POA: Diagnosis not present

## 2022-09-26 DIAGNOSIS — E785 Hyperlipidemia, unspecified: Secondary | ICD-10-CM | POA: Diagnosis not present

## 2022-09-26 DIAGNOSIS — Z8739 Personal history of other diseases of the musculoskeletal system and connective tissue: Secondary | ICD-10-CM | POA: Diagnosis not present

## 2022-09-26 DIAGNOSIS — E119 Type 2 diabetes mellitus without complications: Secondary | ICD-10-CM | POA: Diagnosis not present

## 2022-09-27 DIAGNOSIS — E039 Hypothyroidism, unspecified: Secondary | ICD-10-CM | POA: Diagnosis not present

## 2022-09-27 DIAGNOSIS — Z8739 Personal history of other diseases of the musculoskeletal system and connective tissue: Secondary | ICD-10-CM | POA: Diagnosis not present

## 2022-09-27 DIAGNOSIS — E785 Hyperlipidemia, unspecified: Secondary | ICD-10-CM | POA: Diagnosis not present

## 2022-09-27 DIAGNOSIS — Z0000 Encounter for general adult medical examination without abnormal findings: Secondary | ICD-10-CM | POA: Diagnosis not present

## 2022-09-27 DIAGNOSIS — R5381 Other malaise: Secondary | ICD-10-CM | POA: Diagnosis not present

## 2022-09-27 DIAGNOSIS — D649 Anemia, unspecified: Secondary | ICD-10-CM | POA: Diagnosis not present

## 2022-09-27 DIAGNOSIS — E119 Type 2 diabetes mellitus without complications: Secondary | ICD-10-CM | POA: Diagnosis not present

## 2022-09-27 DIAGNOSIS — E1165 Type 2 diabetes mellitus with hyperglycemia: Secondary | ICD-10-CM | POA: Diagnosis not present

## 2022-09-27 DIAGNOSIS — E538 Deficiency of other specified B group vitamins: Secondary | ICD-10-CM | POA: Diagnosis not present

## 2022-09-27 DIAGNOSIS — C73 Malignant neoplasm of thyroid gland: Secondary | ICD-10-CM | POA: Diagnosis not present

## 2022-09-27 DIAGNOSIS — I10 Essential (primary) hypertension: Secondary | ICD-10-CM | POA: Diagnosis not present

## 2022-09-27 DIAGNOSIS — R5383 Other fatigue: Secondary | ICD-10-CM | POA: Diagnosis not present

## 2022-09-28 DIAGNOSIS — E538 Deficiency of other specified B group vitamins: Secondary | ICD-10-CM | POA: Diagnosis not present

## 2022-10-06 DIAGNOSIS — E538 Deficiency of other specified B group vitamins: Secondary | ICD-10-CM | POA: Diagnosis not present

## 2022-10-13 DIAGNOSIS — E538 Deficiency of other specified B group vitamins: Secondary | ICD-10-CM | POA: Diagnosis not present

## 2022-10-20 DIAGNOSIS — E538 Deficiency of other specified B group vitamins: Secondary | ICD-10-CM | POA: Diagnosis not present

## 2023-02-05 DIAGNOSIS — D2271 Melanocytic nevi of right lower limb, including hip: Secondary | ICD-10-CM | POA: Diagnosis not present

## 2023-02-05 DIAGNOSIS — D225 Melanocytic nevi of trunk: Secondary | ICD-10-CM | POA: Diagnosis not present

## 2023-02-05 DIAGNOSIS — D2262 Melanocytic nevi of left upper limb, including shoulder: Secondary | ICD-10-CM | POA: Diagnosis not present

## 2023-02-05 DIAGNOSIS — D2261 Melanocytic nevi of right upper limb, including shoulder: Secondary | ICD-10-CM | POA: Diagnosis not present

## 2023-02-05 DIAGNOSIS — D2272 Melanocytic nevi of left lower limb, including hip: Secondary | ICD-10-CM | POA: Diagnosis not present

## 2023-02-05 DIAGNOSIS — C43122 Malignant melanoma of left lower eyelid, including canthus: Secondary | ICD-10-CM | POA: Diagnosis not present

## 2023-02-05 DIAGNOSIS — L309 Dermatitis, unspecified: Secondary | ICD-10-CM | POA: Diagnosis not present

## 2023-02-05 DIAGNOSIS — L821 Other seborrheic keratosis: Secondary | ICD-10-CM | POA: Diagnosis not present

## 2023-02-05 DIAGNOSIS — D485 Neoplasm of uncertain behavior of skin: Secondary | ICD-10-CM | POA: Diagnosis not present

## 2023-02-09 DIAGNOSIS — C43122 Malignant melanoma of left lower eyelid, including canthus: Secondary | ICD-10-CM | POA: Diagnosis not present

## 2023-02-13 DIAGNOSIS — D485 Neoplasm of uncertain behavior of skin: Secondary | ICD-10-CM | POA: Diagnosis not present

## 2023-02-20 DIAGNOSIS — M7062 Trochanteric bursitis, left hip: Secondary | ICD-10-CM | POA: Diagnosis not present

## 2023-02-20 DIAGNOSIS — M25552 Pain in left hip: Secondary | ICD-10-CM | POA: Diagnosis not present

## 2023-02-20 DIAGNOSIS — L298 Other pruritus: Secondary | ICD-10-CM | POA: Diagnosis not present

## 2023-02-21 DIAGNOSIS — M8580 Other specified disorders of bone density and structure, unspecified site: Secondary | ICD-10-CM | POA: Diagnosis not present

## 2023-02-21 DIAGNOSIS — M25552 Pain in left hip: Secondary | ICD-10-CM | POA: Diagnosis not present

## 2023-02-27 DIAGNOSIS — C6962 Malignant neoplasm of left orbit: Secondary | ICD-10-CM | POA: Diagnosis not present

## 2023-02-27 DIAGNOSIS — C439 Malignant melanoma of skin, unspecified: Secondary | ICD-10-CM | POA: Diagnosis not present

## 2023-02-27 DIAGNOSIS — Z79890 Hormone replacement therapy (postmenopausal): Secondary | ICD-10-CM | POA: Diagnosis not present

## 2023-02-27 DIAGNOSIS — M1990 Unspecified osteoarthritis, unspecified site: Secondary | ICD-10-CM | POA: Diagnosis not present

## 2023-02-27 DIAGNOSIS — R590 Localized enlarged lymph nodes: Secondary | ICD-10-CM | POA: Diagnosis not present

## 2023-02-27 DIAGNOSIS — C773 Secondary and unspecified malignant neoplasm of axilla and upper limb lymph nodes: Secondary | ICD-10-CM | POA: Diagnosis not present

## 2023-02-27 DIAGNOSIS — Z7983 Long term (current) use of bisphosphonates: Secondary | ICD-10-CM | POA: Diagnosis not present

## 2023-02-27 DIAGNOSIS — C43122 Malignant melanoma of left lower eyelid, including canthus: Secondary | ICD-10-CM | POA: Diagnosis not present

## 2023-02-27 DIAGNOSIS — C4339 Malignant melanoma of other parts of face: Secondary | ICD-10-CM | POA: Diagnosis not present

## 2023-02-27 DIAGNOSIS — Z801 Family history of malignant neoplasm of trachea, bronchus and lung: Secondary | ICD-10-CM | POA: Diagnosis not present

## 2023-03-14 DIAGNOSIS — C43122 Malignant melanoma of left lower eyelid, including canthus: Secondary | ICD-10-CM | POA: Diagnosis not present

## 2023-03-22 DIAGNOSIS — E119 Type 2 diabetes mellitus without complications: Secondary | ICD-10-CM | POA: Diagnosis not present

## 2023-03-22 DIAGNOSIS — E1165 Type 2 diabetes mellitus with hyperglycemia: Secondary | ICD-10-CM | POA: Diagnosis not present

## 2023-03-22 DIAGNOSIS — E039 Hypothyroidism, unspecified: Secondary | ICD-10-CM | POA: Diagnosis not present

## 2023-03-27 DIAGNOSIS — M7062 Trochanteric bursitis, left hip: Secondary | ICD-10-CM | POA: Diagnosis not present

## 2023-03-27 DIAGNOSIS — Z96652 Presence of left artificial knee joint: Secondary | ICD-10-CM | POA: Diagnosis not present

## 2023-03-29 DIAGNOSIS — I10 Essential (primary) hypertension: Secondary | ICD-10-CM | POA: Diagnosis not present

## 2023-03-29 DIAGNOSIS — E1165 Type 2 diabetes mellitus with hyperglycemia: Secondary | ICD-10-CM | POA: Diagnosis not present

## 2023-03-29 DIAGNOSIS — R0602 Shortness of breath: Secondary | ICD-10-CM | POA: Diagnosis not present

## 2023-03-29 DIAGNOSIS — C73 Malignant neoplasm of thyroid gland: Secondary | ICD-10-CM | POA: Diagnosis not present

## 2023-03-29 DIAGNOSIS — E890 Postprocedural hypothyroidism: Secondary | ICD-10-CM | POA: Diagnosis not present

## 2023-04-22 DIAGNOSIS — M25552 Pain in left hip: Secondary | ICD-10-CM | POA: Diagnosis not present

## 2023-04-22 DIAGNOSIS — M8580 Other specified disorders of bone density and structure, unspecified site: Secondary | ICD-10-CM | POA: Diagnosis not present

## 2023-04-22 DIAGNOSIS — M7062 Trochanteric bursitis, left hip: Secondary | ICD-10-CM | POA: Diagnosis not present

## 2023-05-01 DIAGNOSIS — C43122 Malignant melanoma of left lower eyelid, including canthus: Secondary | ICD-10-CM | POA: Diagnosis not present

## 2023-05-01 DIAGNOSIS — L988 Other specified disorders of the skin and subcutaneous tissue: Secondary | ICD-10-CM | POA: Diagnosis not present

## 2023-05-01 DIAGNOSIS — L578 Other skin changes due to chronic exposure to nonionizing radiation: Secondary | ICD-10-CM | POA: Diagnosis not present

## 2023-05-01 DIAGNOSIS — L814 Other melanin hyperpigmentation: Secondary | ICD-10-CM | POA: Diagnosis not present

## 2023-05-10 DIAGNOSIS — E890 Postprocedural hypothyroidism: Secondary | ICD-10-CM | POA: Diagnosis not present

## 2023-05-17 ENCOUNTER — Other Ambulatory Visit: Payer: Self-pay | Admitting: Student

## 2023-05-17 DIAGNOSIS — M7989 Other specified soft tissue disorders: Secondary | ICD-10-CM

## 2023-05-17 DIAGNOSIS — L989 Disorder of the skin and subcutaneous tissue, unspecified: Secondary | ICD-10-CM | POA: Diagnosis not present

## 2023-06-13 ENCOUNTER — Other Ambulatory Visit: Payer: BC Managed Care – PPO

## 2023-06-18 DIAGNOSIS — E1165 Type 2 diabetes mellitus with hyperglycemia: Secondary | ICD-10-CM | POA: Diagnosis not present

## 2023-06-18 DIAGNOSIS — E0590 Thyrotoxicosis, unspecified without thyrotoxic crisis or storm: Secondary | ICD-10-CM | POA: Diagnosis not present

## 2023-06-19 ENCOUNTER — Ambulatory Visit: Payer: BC Managed Care – PPO

## 2023-06-25 DIAGNOSIS — I2699 Other pulmonary embolism without acute cor pulmonale: Secondary | ICD-10-CM | POA: Diagnosis not present

## 2023-06-25 DIAGNOSIS — C73 Malignant neoplasm of thyroid gland: Secondary | ICD-10-CM | POA: Diagnosis not present

## 2023-06-25 DIAGNOSIS — C4330 Malignant melanoma of unspecified part of face: Secondary | ICD-10-CM | POA: Diagnosis not present

## 2023-06-25 DIAGNOSIS — E1165 Type 2 diabetes mellitus with hyperglycemia: Secondary | ICD-10-CM | POA: Diagnosis not present

## 2023-06-25 DIAGNOSIS — I10 Essential (primary) hypertension: Secondary | ICD-10-CM | POA: Diagnosis not present

## 2023-06-25 DIAGNOSIS — E890 Postprocedural hypothyroidism: Secondary | ICD-10-CM | POA: Diagnosis not present

## 2023-07-11 ENCOUNTER — Ambulatory Visit
Admission: RE | Admit: 2023-07-11 | Discharge: 2023-07-11 | Disposition: A | Discharge status: Home / Self Care | Payer: Medicare PPO | Source: Ambulatory Visit | Attending: Student | Admitting: Student

## 2023-07-11 DIAGNOSIS — M7989 Other specified soft tissue disorders: Secondary | ICD-10-CM | POA: Insufficient documentation

## 2023-08-28 ENCOUNTER — Ambulatory Visit: Payer: Self-pay | Admitting: Urology

## 2023-09-12 ENCOUNTER — Ambulatory Visit: Payer: Medicare PPO | Admitting: Urology

## 2023-09-19 ENCOUNTER — Ambulatory Visit: Admitting: Urology

## 2023-09-19 VITALS — BP 137/82 | HR 81 | Ht 67.0 in | Wt 180.2 lb

## 2023-09-19 DIAGNOSIS — N3281 Overactive bladder: Secondary | ICD-10-CM

## 2023-09-19 DIAGNOSIS — R351 Nocturia: Secondary | ICD-10-CM | POA: Diagnosis not present

## 2023-09-19 DIAGNOSIS — N390 Urinary tract infection, site not specified: Secondary | ICD-10-CM

## 2023-09-19 MED ORDER — ESTRADIOL 0.1 MG/GM VA CREA: TOPICAL_CREAM | VAGINAL | 11 refills | Status: AC

## 2023-09-19 MED ORDER — OXYBUTYNIN CHLORIDE ER 10 MG PO TB24: 10.0000 mg | ORAL_TABLET | Freq: Every day | ORAL | 3 refills | Status: AC

## 2023-09-19 NOTE — Progress Notes (Signed)
   09/19/2023 10:47 AM   Alisha Sanchez [birth year] [id number]  Reason for visit: Follow up OAB, recurrent UTI, GSM  HPI: 68 year old female who I saw in July 2023 for overactive bladder symptoms of urgency, frequency, nocturia 4 times overnight.  She has also had problems with recurrent UTIs.  We started topical estrogen cream for her recurrent infections and started oxybutynin 10 mg XL daily.    She thinks she has stopped the oxybutynin, but she is not sure.  She does notice that nocturia has worsened now up to 3 times per night.  Really no problems with urination during the day, no incontinence.  Denies any UTIs or gross hematuria over the last year.  I recommended going back on the oxybutynin 10 mg XL daily, continuing topical estrogen cream.  Behavioral strategies discussed regarding nocturia   Restart oxybutynin 10 mg XL, continue topical estrogen cream RTC 1 year  Sondra Come, MD  [facility] [address] [place], [place] [address] [phone]

## 2023-09-19 NOTE — Patient Instructions (Signed)

## 2023-10-02 DIAGNOSIS — Z85850 Personal history of malignant neoplasm of thyroid: Secondary | ICD-10-CM | POA: Diagnosis not present

## 2023-10-02 DIAGNOSIS — E119 Type 2 diabetes mellitus without complications: Secondary | ICD-10-CM | POA: Diagnosis not present

## 2023-10-02 DIAGNOSIS — E890 Postprocedural hypothyroidism: Secondary | ICD-10-CM | POA: Diagnosis not present

## 2023-11-07 DIAGNOSIS — D225 Melanocytic nevi of trunk: Secondary | ICD-10-CM | POA: Diagnosis not present

## 2023-11-07 DIAGNOSIS — D2272 Melanocytic nevi of left lower limb, including hip: Secondary | ICD-10-CM | POA: Diagnosis not present

## 2023-11-07 DIAGNOSIS — D2262 Melanocytic nevi of left upper limb, including shoulder: Secondary | ICD-10-CM | POA: Diagnosis not present

## 2023-11-07 DIAGNOSIS — L821 Other seborrheic keratosis: Secondary | ICD-10-CM | POA: Diagnosis not present

## 2023-11-07 DIAGNOSIS — D2261 Melanocytic nevi of right upper limb, including shoulder: Secondary | ICD-10-CM | POA: Diagnosis not present

## 2023-11-07 DIAGNOSIS — D485 Neoplasm of uncertain behavior of skin: Secondary | ICD-10-CM | POA: Diagnosis not present

## 2023-11-07 DIAGNOSIS — D2271 Melanocytic nevi of right lower limb, including hip: Secondary | ICD-10-CM | POA: Diagnosis not present

## 2023-11-07 DIAGNOSIS — D045 Carcinoma in situ of skin of trunk: Secondary | ICD-10-CM | POA: Diagnosis not present

## 2023-11-23 ENCOUNTER — Emergency Department

## 2023-11-23 ENCOUNTER — Inpatient Hospital Stay
Admission: EM | Admit: 2023-11-23 | Discharge: 2023-11-28 | DRG: 481 | Disposition: A | Discharge status: Skilled Nursing Facility | Attending: Internal Medicine | Admitting: Internal Medicine

## 2023-11-23 ENCOUNTER — Other Ambulatory Visit: Payer: Self-pay

## 2023-11-23 DIAGNOSIS — Z803 Family history of malignant neoplasm of breast: Secondary | ICD-10-CM

## 2023-11-23 DIAGNOSIS — E039 Hypothyroidism, unspecified: Secondary | ICD-10-CM | POA: Diagnosis not present

## 2023-11-23 DIAGNOSIS — Z833 Family history of diabetes mellitus: Secondary | ICD-10-CM

## 2023-11-23 DIAGNOSIS — Z9842 Cataract extraction status, left eye: Secondary | ICD-10-CM | POA: Diagnosis not present

## 2023-11-23 DIAGNOSIS — S72142D Displaced intertrochanteric fracture of left femur, subsequent encounter for closed fracture with routine healing: Secondary | ICD-10-CM | POA: Diagnosis not present

## 2023-11-23 DIAGNOSIS — K219 Gastro-esophageal reflux disease without esophagitis: Secondary | ICD-10-CM | POA: Diagnosis present

## 2023-11-23 DIAGNOSIS — F39 Unspecified mood [affective] disorder: Secondary | ICD-10-CM | POA: Diagnosis present

## 2023-11-23 DIAGNOSIS — Z85820 Personal history of malignant melanoma of skin: Secondary | ICD-10-CM

## 2023-11-23 DIAGNOSIS — I10 Essential (primary) hypertension: Secondary | ICD-10-CM | POA: Diagnosis present

## 2023-11-23 DIAGNOSIS — C4330 Malignant melanoma of unspecified part of face: Secondary | ICD-10-CM | POA: Diagnosis present

## 2023-11-23 DIAGNOSIS — G894 Chronic pain syndrome: Secondary | ICD-10-CM | POA: Diagnosis not present

## 2023-11-23 DIAGNOSIS — S2242XA Multiple fractures of ribs, left side, initial encounter for closed fracture: Secondary | ICD-10-CM | POA: Diagnosis not present

## 2023-11-23 DIAGNOSIS — Z7983 Long term (current) use of bisphosphonates: Secondary | ICD-10-CM

## 2023-11-23 DIAGNOSIS — Z801 Family history of malignant neoplasm of trachea, bronchus and lung: Secondary | ICD-10-CM

## 2023-11-23 DIAGNOSIS — C4310 Malignant melanoma of unspecified eyelid, including canthus: Secondary | ICD-10-CM | POA: Diagnosis not present

## 2023-11-23 DIAGNOSIS — M1612 Unilateral primary osteoarthritis, left hip: Secondary | ICD-10-CM | POA: Diagnosis not present

## 2023-11-23 DIAGNOSIS — S72002A Fracture of unspecified part of neck of left femur, initial encounter for closed fracture: Principal | ICD-10-CM

## 2023-11-23 DIAGNOSIS — Z7401 Bed confinement status: Secondary | ICD-10-CM | POA: Diagnosis not present

## 2023-11-23 DIAGNOSIS — Z85850 Personal history of malignant neoplasm of thyroid: Secondary | ICD-10-CM

## 2023-11-23 DIAGNOSIS — Y93K1 Activity, walking an animal: Secondary | ICD-10-CM

## 2023-11-23 DIAGNOSIS — R0689 Other abnormalities of breathing: Secondary | ICD-10-CM | POA: Diagnosis not present

## 2023-11-23 DIAGNOSIS — S79912A Unspecified injury of left hip, initial encounter: Secondary | ICD-10-CM | POA: Diagnosis not present

## 2023-11-23 DIAGNOSIS — W010XXA Fall on same level from slipping, tripping and stumbling without subsequent striking against object, initial encounter: Secondary | ICD-10-CM | POA: Diagnosis present

## 2023-11-23 DIAGNOSIS — E785 Hyperlipidemia, unspecified: Secondary | ICD-10-CM | POA: Diagnosis present

## 2023-11-23 DIAGNOSIS — Y929 Unspecified place or not applicable: Secondary | ICD-10-CM | POA: Diagnosis not present

## 2023-11-23 DIAGNOSIS — S72142A Displaced intertrochanteric fracture of left femur, initial encounter for closed fracture: Secondary | ICD-10-CM | POA: Diagnosis not present

## 2023-11-23 DIAGNOSIS — Z79890 Hormone replacement therapy (postmenopausal): Secondary | ICD-10-CM | POA: Diagnosis not present

## 2023-11-23 DIAGNOSIS — Z86711 Personal history of pulmonary embolism: Secondary | ICD-10-CM | POA: Diagnosis not present

## 2023-11-23 DIAGNOSIS — D509 Iron deficiency anemia, unspecified: Secondary | ICD-10-CM | POA: Diagnosis not present

## 2023-11-23 DIAGNOSIS — Z96652 Presence of left artificial knee joint: Secondary | ICD-10-CM | POA: Diagnosis not present

## 2023-11-23 DIAGNOSIS — M810 Age-related osteoporosis without current pathological fracture: Secondary | ICD-10-CM | POA: Diagnosis not present

## 2023-11-23 DIAGNOSIS — F32A Depression, unspecified: Secondary | ICD-10-CM | POA: Diagnosis not present

## 2023-11-23 DIAGNOSIS — Z961 Presence of intraocular lens: Secondary | ICD-10-CM | POA: Diagnosis present

## 2023-11-23 DIAGNOSIS — Z7984 Long term (current) use of oral hypoglycemic drugs: Secondary | ICD-10-CM | POA: Diagnosis not present

## 2023-11-23 DIAGNOSIS — D62 Acute posthemorrhagic anemia: Secondary | ICD-10-CM | POA: Diagnosis not present

## 2023-11-23 DIAGNOSIS — K5900 Constipation, unspecified: Secondary | ICD-10-CM | POA: Diagnosis not present

## 2023-11-23 DIAGNOSIS — N3281 Overactive bladder: Secondary | ICD-10-CM | POA: Diagnosis not present

## 2023-11-23 DIAGNOSIS — S72145A Nondisplaced intertrochanteric fracture of left femur, initial encounter for closed fracture: Secondary | ICD-10-CM | POA: Diagnosis not present

## 2023-11-23 DIAGNOSIS — C73 Malignant neoplasm of thyroid gland: Secondary | ICD-10-CM | POA: Diagnosis present

## 2023-11-23 DIAGNOSIS — E1165 Type 2 diabetes mellitus with hyperglycemia: Secondary | ICD-10-CM

## 2023-11-23 DIAGNOSIS — Z800 Family history of malignant neoplasm of digestive organs: Secondary | ICD-10-CM

## 2023-11-23 DIAGNOSIS — Z823 Family history of stroke: Secondary | ICD-10-CM | POA: Diagnosis not present

## 2023-11-23 DIAGNOSIS — R110 Nausea: Secondary | ICD-10-CM | POA: Diagnosis not present

## 2023-11-23 DIAGNOSIS — W19XXXA Unspecified fall, initial encounter: Secondary | ICD-10-CM | POA: Diagnosis not present

## 2023-11-23 DIAGNOSIS — Z79899 Other long term (current) drug therapy: Secondary | ICD-10-CM

## 2023-11-23 DIAGNOSIS — W19XXXD Unspecified fall, subsequent encounter: Secondary | ICD-10-CM | POA: Diagnosis not present

## 2023-11-23 DIAGNOSIS — Z0389 Encounter for observation for other suspected diseases and conditions ruled out: Secondary | ICD-10-CM | POA: Diagnosis not present

## 2023-11-23 DIAGNOSIS — Z95828 Presence of other vascular implants and grafts: Secondary | ICD-10-CM

## 2023-11-23 LAB — CBC
HCT: 32.6 % — ABNORMAL LOW (ref 36.0–46.0)
Hemoglobin: 10.2 g/dL — ABNORMAL LOW (ref 12.0–15.0)
MCH: 23.4 pg — ABNORMAL LOW (ref 26.0–34.0)
MCHC: 31.3 g/dL (ref 30.0–36.0)
MCV: 74.8 fL — ABNORMAL LOW (ref 80.0–100.0)
Platelets: 305 10*3/uL (ref 150–400)
RBC: 4.36 MIL/uL (ref 3.87–5.11)
RDW: 15.4 % (ref 11.5–15.5)
WBC: 6.2 10*3/uL (ref 4.0–10.5)
nRBC: 0 % (ref 0.0–0.2)

## 2023-11-23 LAB — COMPREHENSIVE METABOLIC PANEL WITH GFR
ALT: 12 U/L (ref 0–44)
AST: 25 U/L (ref 15–41)
Albumin: 3.8 g/dL (ref 3.5–5.0)
Alkaline Phosphatase: 58 U/L (ref 38–126)
Anion gap: 13 (ref 5–15)
BUN: 18 mg/dL (ref 8–23)
CO2: 22 mmol/L (ref 22–32)
Calcium: 8.9 mg/dL (ref 8.9–10.3)
Chloride: 104 mmol/L (ref 98–111)
Creatinine, Ser: 0.71 mg/dL (ref 0.44–1.00)
GFR, Estimated: >60 mL/min (ref >60)
Glucose, Bld: 236 mg/dL — ABNORMAL HIGH (ref 70–99)
Potassium: 3.8 mmol/L (ref 3.5–5.1)
Sodium: 139 mmol/L (ref 135–145)
Total Bilirubin: 0.5 mg/dL (ref 0.0–1.2)
Total Protein: 7 g/dL (ref 6.5–8.1)

## 2023-11-23 LAB — PROTIME-INR
INR: 1 (ref 0.8–1.2)
Prothrombin Time: 12.9 s (ref 11.4–15.2)

## 2023-11-23 MED ORDER — METHOCARBAMOL 1000 MG/10ML IJ SOLN: 500.0000 mg | Freq: Four times a day (QID) | INTRAMUSCULAR | Status: DC | PRN

## 2023-11-23 MED ORDER — CITALOPRAM HYDROBROMIDE 10 MG PO TABS
10.0000 mg | ORAL_TABLET | Freq: Every day | ORAL | Status: DC
  Administered 2023-11-24 – 2023-11-28 (×5): 10 mg via ORAL
  Filled 2023-11-23 (×5): qty 1

## 2023-11-23 MED ORDER — LEVOTHYROXINE SODIUM 112 MCG PO TABS
112.0000 ug | ORAL_TABLET | Freq: Every day | ORAL | Status: DC
  Administered 2023-11-24 – 2023-11-28 (×5): 112 ug via ORAL
  Filled 2023-11-23 (×5): qty 1

## 2023-11-23 MED ORDER — ROSUVASTATIN CALCIUM 10 MG PO TABS
10.0000 mg | ORAL_TABLET | Freq: Every day | ORAL | Status: DC
  Administered 2023-11-24 – 2023-11-27 (×5): 10 mg via ORAL
  Filled 2023-11-23 (×6): qty 1

## 2023-11-23 MED ORDER — INSULIN ASPART 100 UNIT/ML IJ SOLN
0.0000 [IU] | INTRAMUSCULAR | Status: DC
  Administered 2023-11-24: 3 [IU] via SUBCUTANEOUS
  Administered 2023-11-24: 2 [IU] via SUBCUTANEOUS
  Administered 2023-11-24: 5 [IU] via SUBCUTANEOUS
  Administered 2023-11-24: 3 [IU] via SUBCUTANEOUS
  Filled 2023-11-23 (×3): qty 1

## 2023-11-23 MED ORDER — MELATONIN 5 MG PO TABS: 10.0000 mg | ORAL_TABLET | Freq: Every evening | ORAL | Status: DC | PRN

## 2023-11-23 MED ORDER — METHOCARBAMOL 500 MG PO TABS: 500.0000 mg | ORAL_TABLET | Freq: Four times a day (QID) | ORAL | Status: DC | PRN

## 2023-11-23 MED ORDER — BISACODYL 5 MG PO TBEC: 5.0000 mg | DELAYED_RELEASE_TABLET | Freq: Every day | ORAL | Status: DC | PRN

## 2023-11-23 MED ORDER — FENTANYL CITRATE PF 50 MCG/ML IJ SOSY
50.0000 ug | PREFILLED_SYRINGE | Freq: Once | INTRAMUSCULAR | Status: AC
  Administered 2023-11-23: 50 ug via INTRAVENOUS
  Filled 2023-11-23: qty 1

## 2023-11-23 MED ORDER — SODIUM CHLORIDE 0.9 % IV SOLN: INTRAVENOUS | Status: DC

## 2023-11-23 MED ORDER — TRANEXAMIC ACID-NACL 1000-0.7 MG/100ML-% IV SOLN
1000.0000 mg | INTRAVENOUS | Status: AC
  Administered 2023-11-24: 1000 mg via INTRAVENOUS
  Filled 2023-11-23: qty 100

## 2023-11-23 MED ORDER — HYDROCODONE-ACETAMINOPHEN 5-325 MG PO TABS
1.0000 | ORAL_TABLET | ORAL | Status: DC | PRN
  Administered 2023-11-24: 1 via ORAL
  Filled 2023-11-23: qty 1

## 2023-11-23 MED ORDER — PANTOPRAZOLE SODIUM 40 MG PO TBEC
40.0000 mg | DELAYED_RELEASE_TABLET | Freq: Every day | ORAL | Status: DC
Start: 2023-11-24 — End: 2023-11-28
  Administered 2023-11-24 – 2023-11-28 (×5): 40 mg via ORAL
  Filled 2023-11-23 (×5): qty 1

## 2023-11-23 MED ORDER — MORPHINE SULFATE (PF) 2 MG/ML IV SOLN
1.0000 mg | INTRAVENOUS | Status: DC | PRN
  Administered 2023-11-24: 1 mg via INTRAVENOUS
  Filled 2023-11-23: qty 1

## 2023-11-23 MED ORDER — MORPHINE SULFATE (PF) 2 MG/ML IV SOLN
2.0000 mg | INTRAVENOUS | Status: DC | PRN
  Administered 2023-11-24: 2 mg via INTRAVENOUS
  Filled 2023-11-23: qty 1

## 2023-11-23 MED ORDER — AMLODIPINE BESYLATE 5 MG PO TABS
5.0000 mg | ORAL_TABLET | Freq: Every day | ORAL | Status: DC
  Administered 2023-11-24 – 2023-11-27 (×5): 5 mg via ORAL
  Filled 2023-11-23 (×5): qty 1

## 2023-11-23 MED ORDER — CELECOXIB 200 MG PO CAPS
200.0000 mg | ORAL_CAPSULE | Freq: Two times a day (BID) | ORAL | Status: AC
  Administered 2023-11-24: 200 mg via ORAL
  Filled 2023-11-23 (×2): qty 1

## 2023-11-23 MED ORDER — GABAPENTIN 100 MG PO CAPS
200.0000 mg | ORAL_CAPSULE | Freq: Two times a day (BID) | ORAL | Status: DC
  Administered 2023-11-24 – 2023-11-28 (×9): 200 mg via ORAL
  Filled 2023-11-23 (×10): qty 2

## 2023-11-23 MED ORDER — CEFAZOLIN SODIUM-DEXTROSE 2-4 GM/100ML-% IV SOLN
2.0000 g | INTRAVENOUS | Status: AC
  Administered 2023-11-24: 2 g via INTRAVENOUS
  Filled 2023-11-23: qty 100

## 2023-11-23 MED ORDER — OXYBUTYNIN CHLORIDE ER 5 MG PO TB24
10.0000 mg | ORAL_TABLET | Freq: Every day | ORAL | Status: DC
  Administered 2023-11-24 – 2023-11-27 (×5): 10 mg via ORAL
  Filled 2023-11-23 (×5): qty 2

## 2023-11-23 NOTE — Assessment & Plan Note (Signed)
 Hold home oral meds Sliding scale insulin  coverage

## 2023-11-23 NOTE — Assessment & Plan Note (Signed)
 Continue levothyroxine 112 mcg

## 2023-11-23 NOTE — ED Triage Notes (Signed)
 Pt had a mechanical fall tripping over her dog while walking. 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 Pt was given 100mcg of Fentanyl  and Zofran  4mg .

## 2023-11-23 NOTE — Assessment & Plan Note (Signed)
Continue pain meds  

## 2023-11-23 NOTE — H&P (Addendum)
 History and Physical    Patient: Alisha Sanchez IHK:[id number] DOB: 08-Dec-1955 DOA: 11/23/2023 DOS: the patient was seen and examined on 11/23/2023 PCP: Lyle San, MD  Patient coming from: Home  Chief Complaint:  Chief Complaint  Patient presents with   Fall    HPI: Alisha Sanchez is a 68 y.o. 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  She was previously in her usual state of health and denied any recent ongoing acute illness. ED course and data review: Vitals within normal limits. Labs notable for glucose 236, hemoglobin 10.2 but otherwise CBC, CMP and INR unremarkable.  EKG, personally viewed and interpreted showing sinus at 68 with no concerning ST-T wave findings  Chest x-ray nonacute Hip x-ray showing finding of nondisplaced intertrochanteric fracture of the proximal left femur.  The ED provider spoke with orthopedist, Dr. Annabell Key who would like to take patient to the OR on 5/24  Patient treated with morphine  and fentanyl  as well as hydrocodone  for pain given tranexamic acid   Hospitalist consulted for admission.     Review of Systems: As mentioned in the history of present illness. All other systems reviewed and are negative.  Past Medical History:  Diagnosis Date   Arthritis    knees   Cancer (HCC)    Thyroid    Complication of anesthesia    Depression    Diabetes mellitus type 2 in obese    Dyspnea    GERD (gastroesophageal reflux disease)    Headache    migraines - 3-4x/yr   HLD (hyperlipidemia)    HTN (hypertension)    Hypothyroidism    no thyroid    Localized swelling of both lower legs    leaky veins   Lower extremity edema    Neck pain    left side   Numbness and  tingling    numbness in hands and face tingles   PONV (postoperative nausea and vomiting)    Pulmonary embolus (HCC)    after thyroid  surgery   Vertigo    no episodes in approx 2 yrs   Past Surgical History:  Procedure Laterality Date   c-sections     x2    CATARACT EXTRACTION W/PHACO Left 08/27/2017   Procedure: CATARACT EXTRACTION PHACO AND INTRAOCULAR LENS PLACEMENT (IOC) LEFT DIABETIC;  Surgeon: Annell Kidney, MD;  Location: [facility];  Service: Ophthalmology;  Laterality: Left;  Diabetic - oral meds   COLONOSCOPY N/A 11/10/2015   Procedure: COLONOSCOPY;  Surgeon: Stephens Eis, MD;  Location: [facility];  Service: Gastroenterology;  Laterality: N/A;   ESOPHAGOGASTRODUODENOSCOPY N/A 11/10/2015   Procedure: ESOPHAGOGASTRODUODENOSCOPY (EGD) with dialation;  Surgeon: Stephens Eis, MD;  Location: [facility];  Service: Gastroenterology;  Laterality: N/A;   IVC FILTER INSERTION N/A 03/17/2019   Procedure: IVC FILTER INSERTION;  Surgeon: Celso College, MD;  Location: [facility] INVASIVE CV LAB;  Service: Cardiovascular;  Laterality: N/A;   IVC FILTER REMOVAL N/A 06/05/2019   Procedure: IVC FILTER REMOVAL;  Surgeon: Celso College, MD;  Location: [facility] INVAS[facility] CV LAB;  Service: Cardiovascular;  Laterality: N/A;   KNEE ARTHROPLASTY Left 03/24/2019   Procedure: COMPUTER ASSISTED TOTAL KNEE ARTHROPLASTY - RNFA;  Surgeon: Arlyne Lame, MD;  Location: [facility] ORS;  Service:  Orthopedics;  Laterality: Left;   SACROPLASTY N/A 01/12/2021   Procedure: SACROPLASTY;  Surgeon: Molli Angelucci, MD;  Location: [facility] ORS;  Service: Orthopedics;  Laterality: N/A;   SHOULDER ARTHROSCOPY Left 11/15/2018   Procedure: ARTHROSCOPY SHOULDER WITH DEBRIDEMENT DECOMPRESSION, ROTATOR CUFF REPAIR AND POSSIBLE BICEPS TENODESIS;  Surgeon: Elner Hahn, MD;  Location: [facility];  Service: Orthopedics;  Laterality: Left;  SMITH AND NEPHEW REGENTEN PATCH   THYROIDECTOMY     Social History:  reports that  she has never smoked. She has never been exposed to tobacco smoke. She has never used smokeless tobacco. She reports current alcohol use of about 1.0 standard drink of alcohol per week. She reports that she does not use drugs.  No Known Allergies  Family History  Problem Relation Age of Onset   Liver cancer Father    Lung cancer Father    Diabetes Father    Stroke Other    Breast cancer Maternal Aunt 32   Breast cancer Paternal Aunt 50    Prior to Admission medications   Medication Sig Start Date End Date Taking? Authorizing Provider  alendronate (FOSAMAX) 70 MG tablet Take 70 mg by mouth once a week. Take with a full glass of water  on an empty stomach.    [provider]  amLODipine  (NORVASC ) 5 MG tablet Take 5 mg by mouth at bedtime.     [provider]  celecoxib  (CELEBREX ) 100 MG capsule Take 100 mg by mouth 2 (two) times daily. 07/26/22   [provider]  citalopram (CELEXA) 10 MG tablet Take 1 tablet by mouth daily. 07/29/22 09/19/23  [provider]  estradiol  (ESTRACE ) 0.1 MG/GM vaginal cream Estrogen Cream Instruction Discard applicator Apply pea sized amount to tip of finger to urethra before bed. Wash hands well after application. Use Monday, Wednesday and Friday 09/19/23   Lawerence Pressman, MD  glipiZIDE (GLUCOTROL XL) 2.5 MG 24 hr tablet Take 2.5 mg by mouth daily. 11/25/20   [provider]  hydrochlorothiazide  (HYDRODIURIL ) 25 MG tablet Take 25 mg by mouth daily.    [provider]  JANUMET  50-1000 MG tablet Take 1 tablet by mouth 2 (two) times daily. 01/18/22   [provider]  Melatonin 10 MG TABS Take 1 tablet by mouth at bedtime as needed.    [provider]  Multiple Vitamins-Minerals (MULTIVITAMIN ADULT PO) Take 2 tablets by mouth daily.     [provider]  oxybutynin  (DITROPAN -XL) 10 MG 24 hr tablet Take 1 tablet (10 mg total) by mouth at bedtime. 09/19/23   Lawerence Pressman, MD  pantoprazole   (PROTONIX ) 40 MG tablet Take 40 mg by mouth daily.    [provider]  polyethylene glycol (MIRALAX  / GLYCOLAX ) 17 g packet Take 17 g by mouth daily as needed for mild constipation. 01/14/21   Akula, Vijaya, MD  rosuvastatin  (CRESTOR ) 10 MG tablet Take 10 mg by mouth at bedtime.     [provider]  SYNTHROID  112 MCG tablet Take 112 mcg by mouth daily. 12/20/21   [provider]  traMADol  (ULTRAM ) 50 MG tablet Take 50 mg by mouth every 6 (six) hours as needed. 08/07/22   [provider]  zolpidem (AMBIEN) 5 MG tablet Take 5 mg by mouth at bedtime. 07/31/22   [provider]    Physical Exam: Vitals:   11/23/23 2155 11/23/23 2156 11/23/23 2300 11/23/23 2315  BP:  138/68 (!) 131/59   Pulse: 64  76 70  Resp: 20  15 15   Temp: 98.8 F (37.1 C)     SpO2: 100%   97%   Physical Exam Vitals and nursing note reviewed.  Constitutional:      General: She is not in acute distress. HENT:     Head: Normocephalic and atraumatic.  Cardiovascular:     Rate and Rhythm: Normal rate and regular rhythm.     Heart sounds: Normal heart sounds.  Pulmonary:     Effort: Pulmonary effort is normal.     Breath sounds: Normal breath sounds.  Abdominal:     Palpations: Abdomen is soft.     Tenderness: There is no abdominal tenderness.  Neurological:     Mental Status: Mental status is at baseline.     Labs on Admission: I have personally reviewed following labs and imaging studies  CBC: Recent Labs  Lab 11/23/23 2206  WBC 6.2  HGB 10.2*  HCT 32.6*  MCV 74.8*  PLT 305   Basic Metabolic Panel: Recent Labs  Lab 11/23/23 2206  NA 139  K 3.8  CL 104  CO2 22  GLUCOSE 236*  BUN 18  CREATININE 0.71  CALCIUM  8.9   GFR: CrCl cannot be calculated (Unknown ideal weight.). Liver Function Tests: Recent Labs  Lab 11/23/23 2206  AST 25  ALT 12  ALKPHOS 58  BILITOT 0.5  PROT 7.0  ALBUMIN 3.8   No results for input(s): "LIPASE", "AMYLASE" in the last  168 hours. No results for input(s): "AMMONIA" in the last 168 hours. Coagulation Profile: Recent Labs  Lab 11/23/23 2205  INR 1.0   Cardiac Enzymes: No results for input(s): "CKTOTAL", "CKMB", "CKMBINDEX", "TROPONINI" in the last 168 hours. BNP (last 3 results) No results for input(s): "PROBNP" in the last 8760 hours. HbA1C: No results for input(s): "HGBA1C" in the last 72 hours. CBG: No results for input(s): "GLUCAP" in the last 168 hours. Lipid Profile: No results for input(s): "CHOL", "HDL", "LDLCALC", "TRIG", "CHOLHDL", "LDLDIRECT" in the last 72 hours. Thyroid  Function Tests: No results for input(s): "TSH", "T4TOTAL", "FREET4", "T3FREE", "THYROIDAB" in the last 72 hours. Anemia Panel: No results for input(s): "VITAMINB12", "FOLATE", "FERRITIN", "TIBC", "IRON", "RETICCTPCT" in the last 72 hours. Urine analysis:    Component Value Date/Time   COLORURINE YELLOW (A) 01/11/2021 1617   APPEARANCEUR Clear 01/02/2022 1357   LABSPEC 1.013 01/11/2021 1617   PHURINE 7.0 01/11/2021 1617   GLUCOSEU Negative 01/02/2022 1357   HGBUR NEGATIVE 01/11/2021 1617   BILIRUBINUR Negative 01/02/2022 1357   KETONESUR NEGATIVE 01/11/2021 1617   PROTEINUR Negative 01/02/2022 1357   PROTEINUR NEGATIVE 01/11/2021 1617   UROBILINOGEN 0.2 07/22/2007 0917   NITRITE Positive (A) 01/02/2022 1357   NITRITE NEGATIVE 01/11/2021 1617   LEUKOCYTESUR Negative 01/02/2022 1357   LEUKOCYTESUR TRACE (A) 01/11/2021 1617    Radiological Exams on Admission: DG HIP UNILAT WITH PELVIS 2-3 VIEWS LEFT Result Date: 11/23/2023 CLINICAL DATA:  Status post fall. EXAM: DG HIP (WITH OR WITHOUT PELVIS) 2-3V LEFT COMPARISON:  None Available. FINDINGS: Acute nondisplaced fracture deformity is seen extending through the inter trochanteric region of the proximal left femur. There is no evidence of dislocation. Degenerative changes seen in the form of joint space narrowing and acetabular sclerosis. IMPRESSION: Acute nondisplaced  intertrochanteric fracture of the proximal left femur. Electronically Signed   By: Virgle Grime M.D.   On: 11/23/2023 23:11   DG Chest 1 View Result Date: 11/23/2023 CLINICAL DATA:  Status post fall. EXAM: CHEST  1 VIEW COMPARISON:  December 23, 2020 FINDINGS: The heart size and mediastinal contours are within normal limits. Both lungs are clear. Multiple chronic left-sided rib fractures are seen. Multilevel degenerative changes seen throughout the thoracic spine. IMPRESSION: No active cardiopulmonary disease. Electronically Signed   By: Virgle Grime M.D.   On: 11/23/2023 22:54   Data Reviewed for HPI: Relevant notes from primary care and specialist visits, past discharge summaries as available in EHR, including Care Everywhere. Prior diagnostic testing as pertinent to current admission diagnoses Updated medications and problem lists for reconciliation ED course, including vitals, labs, imaging, treatment and response to treatment Triage notes, nursing and pharmacy notes and ED provider's notes Notable results as noted above in HPI      Assessment and Plan: * Closed intertrochanteric fracture of hip, left, initial encounter (HCC) Accidental fall Pain control N.p.o. from midnight for surgery in the a.m. Dr. Annabell Key aware.  Formal consult placed Patient is at low risk for perioperative cardiopulmonary complication  Uncontrolled type 2 diabetes mellitus with hyperglycemia, without long-term current use of insulin  (HCC) Hold home oral meds Sliding scale insulin  coverage  Chronic pain disorder Continue pain meds  Malignant melanoma of face [facility]) S/p Mohs surgery left lower eyelid 04/2023  Thyroid  cancer s/p radioactive iodine  2009 (HCC) Continue levothyroxine  112 mcg  History of pulmonary embolism S/p IVC filter Not currently on anticoagulants  Essential hypertension Continue amlodipine .  Will hold hydrochlorothiazide      DVT prophylaxis: SCD  Consults: Orthopedics,  Dr. Annabell Key  Advance Care Planning:   Code Status: Prior   Family Communication: Husband at bedside  Disposition Plan: Back to previous home environment  Severity of Illness: The appropriate patient status for this patient is INPATIENT. Inpatient status is judged to be reasonable and necessary in order to provide the required intensity of service to ensure the patient's safety. The patient's presenting symptoms, physical exam findings, and initial radiographic and laboratory data in the context of their chronic comorbidities is felt to place them at high risk for further clinical deterioration. Furthermore, it is not anticipated that the patient will be medically stable for discharge from the hospital within 2 midnights of admission.   * I certify that at the point of admission it is my clinical judgment that the patient will require inpatient hospital care spanning beyond 2 midnights from the point of admission due to high intensity of service, high risk for further deterioration and high frequency of surveillance required.*  Author: Lanetta Pion, MD 11/23/2023 11:55 PM  For on call review [email or web address].

## 2023-11-23 NOTE — ED Notes (Signed)
 Pt transported to xray via stretcher per rad tech

## 2023-11-23 NOTE — Assessment & Plan Note (Signed)
 S/p IVC filter Not currently on anticoagulants

## 2023-11-23 NOTE — Assessment & Plan Note (Signed)
 Accidental fall Pain control N.p.o. from midnight for surgery in the a.m. Dr. Annabell Key aware.  Formal consult placed Patient is at low risk for perioperative cardiopulmonary complication

## 2023-11-23 NOTE — ED Provider Notes (Signed)
 [facility] Provider Note    Event Date/Time   First MD Initiated Contact with Patient 11/23/23 2150     (approximate)  History   Chief Complaint: Fall  HPI  Alisha Sanchez is a 68 y.o. female with a past medical history of diabetes, gastric reflux, hypertension, hyperlipidemia, presents to the emergency department for fall with left hip pain.  According to the patient she was attempting to walk her dog when she got tripped up when the dog pulled forward causing her to fall onto her left sign.  Does not believe she hit her head.  No LOC.  No anticoagulation.  Patient experience significant left hip pain.  Unable to stand due to hip pain and EMS was called.  Upon arrival patient appears to have a shortened and externally rotated left lower extremity.  Physical Exam   Triage Vital Signs: ED Triage Vitals  Encounter Vitals Group     BP 11/23/23 2156 138/68     Systolic BP Percentile --      Diastolic BP Percentile --      Pulse Rate 11/23/23 2155 64     Resp 11/23/23 2155 20     Temp 11/23/23 2155 98.8 F (37.1 C)     Temp src --      SpO2 11/23/23 2155 100 %     Weight --      Height --      Head Circumference --      Peak Flow --      Pain Score 11/23/23 2153 5     Pain Loc --      Pain Education --      Exclude from Growth Chart --     Most recent vital signs: Vitals:   11/23/23 2155 11/23/23 2156  BP:  138/68  Pulse: 64   Resp: 20   Temp: 98.8 F (37.1 C)   SpO2: 100%     General: Awake, no distress.  CV:  Good peripheral perfusion.  Regular rate and rhythm  Resp:  Normal effort.  Equal breath sounds bilaterally.  Abd:  No distention.  Soft, nontender.  No rebound or guarding. Other:  Patient has a shortened and externally rotated left lower extremity.  2+ DP pulse with sensation intact.   ED Results / Procedures / Treatments   EKG  EKG viewed and interpreted by myself shows a normal sinus rhythm at 68 bpm with a narrow  QRS, normal axis, normal intervals, no concerning ST changes.  RADIOLOGY  I have reviewed and interpreted the hip x-ray image patient appears to have a left intertrochanteric fracture.   MEDICATIONS ORDERED IN ED: Medications  fentaNYL  (SUBLIMAZE ) injection 50 mcg (has no administration in time range)     IMPRESSION / MDM / ASSESSMENT AND PLAN / ED COURSE  I reviewed the triage vital signs and the nursing notes.  Patient's presentation is most consistent with acute presentation with potential threat to life or bodily function.  Patient presents emergency department for a mechanical fall.  Patient was trying to walk her dog when the dog pulled forward causing her to fall onto her left side.  Patient has a shortened externally rotated left lower extremity.  Neurovascularly intact distally with 2+ DP pulse.  No signs of head trauma.  Patient denies loss of consciousness or anticoagulation.  No other traumatic findings on my examination.  We will check labs will obtain a left hip and pelvis x-ray as well as a  chest x-ray for preop purposes.  Highly suspect hip fracture.  Will dose pain medication while awaiting results.  Patient's labs show reassuring CBC, reassuring chemistry.  X-ray appears to be consistent with a intertrochanteric hip fracture.  Will discuss with orthopedics and admit to the hospital service.  FINAL CLINICAL IMPRESSION(S) / ED DIAGNOSES   Left hip fracture   Note:  This document was prepared using Dragon voice recognition software and may include unintentional dictation errors.   Ruth Cove, MD 11/23/23 2249

## 2023-11-23 NOTE — Assessment & Plan Note (Signed)
 S/p Mohs surgery left lower eyelid 04/2023

## 2023-11-23 NOTE — Assessment & Plan Note (Signed)
Continue amlodipine.  Will hold hydrochlorothiazide

## 2023-11-24 ENCOUNTER — Inpatient Hospital Stay

## 2023-11-24 ENCOUNTER — Encounter
Admission: EM | Disposition: A | Discharge status: Skilled Nursing Facility | Payer: Self-pay | Source: Home / Self Care | Attending: Internal Medicine

## 2023-11-24 ENCOUNTER — Inpatient Hospital Stay: Admitting: Anesthesiology

## 2023-11-24 ENCOUNTER — Other Ambulatory Visit: Payer: Self-pay

## 2023-11-24 DIAGNOSIS — S72142A Displaced intertrochanteric fracture of left femur, initial encounter for closed fracture: Secondary | ICD-10-CM | POA: Diagnosis not present

## 2023-11-24 HISTORY — PX: INTRAMEDULLARY (IM) NAIL INTERTROCHANTERIC: SHX5875

## 2023-11-24 LAB — GLUCOSE, CAPILLARY
Glucose-Capillary: 172 mg/dL — ABNORMAL HIGH (ref 70–99)
Glucose-Capillary: 127 mg/dL — ABNORMAL HIGH (ref 70–99)
Glucose-Capillary: 145 mg/dL — ABNORMAL HIGH (ref 70–99)
Glucose-Capillary: 183 mg/dL — ABNORMAL HIGH (ref 70–99)
Glucose-Capillary: 296 mg/dL — ABNORMAL HIGH (ref 70–99)
Glucose-Capillary: 285 mg/dL — ABNORMAL HIGH (ref 70–99)

## 2023-11-24 LAB — HEMOGLOBIN A1C
Hgb A1c MFr Bld: 6.7 % — ABNORMAL HIGH (ref 4.8–5.6)
Mean Plasma Glucose: 145.59 mg/dL

## 2023-11-24 LAB — URINALYSIS, ROUTINE W REFLEX MICROSCOPIC
Bilirubin Urine: NEGATIVE
Glucose, UA: 150 mg/dL — AB
Ketones, ur: NEGATIVE mg/dL
Nitrite: NEGATIVE
Protein, ur: 30 mg/dL — AB
Specific Gravity, Urine: 1.017 (ref 1.005–1.030)
WBC, UA: >50 WBC/hpf (ref 0–5)
pH: 7 (ref 5.0–8.0)

## 2023-11-24 LAB — TYPE AND SCREEN
ABO/RH(D): A POS
Antibody Screen: NEGATIVE

## 2023-11-24 LAB — CBC
HCT: 30.2 % — ABNORMAL LOW (ref 36.0–46.0)
Hemoglobin: 9.4 g/dL — ABNORMAL LOW (ref 12.0–15.0)
MCH: 23.4 pg — ABNORMAL LOW (ref 26.0–34.0)
MCHC: 31.1 g/dL (ref 30.0–36.0)
MCV: 75.3 fL — ABNORMAL LOW (ref 80.0–100.0)
Platelets: 231 10*3/uL (ref 150–400)
RBC: 4.01 MIL/uL (ref 3.87–5.11)
RDW: 15.5 % (ref 11.5–15.5)
WBC: 13 10*3/uL — ABNORMAL HIGH (ref 4.0–10.5)
nRBC: 0 % (ref 0.0–0.2)

## 2023-11-24 LAB — HIV ANTIBODY (ROUTINE TESTING W REFLEX): HIV Screen 4th Generation wRfx: NONREACTIVE

## 2023-11-24 SURGERY — FIXATION, FRACTURE, INTERTROCHANTERIC, WITH INTRAMEDULLARY ROD
Anesthesia: General | Site: Hip | Laterality: Left

## 2023-11-24 MED ORDER — TRANEXAMIC ACID-NACL 1000-0.7 MG/100ML-% IV SOLN
1000.0000 mg | Freq: Once | INTRAVENOUS | Status: AC
  Administered 2023-11-24: 1000 mg via INTRAVENOUS

## 2023-11-24 MED ORDER — TRANEXAMIC ACID-NACL 1000-0.7 MG/100ML-% IV SOLN
INTRAVENOUS | Status: AC
  Filled 2023-11-24: qty 100

## 2023-11-24 MED ORDER — ZOLPIDEM TARTRATE 5 MG PO TABS
5.0000 mg | ORAL_TABLET | Freq: Every evening | ORAL | Status: DC | PRN
  Administered 2023-11-27: 5 mg via ORAL
  Filled 2023-11-24: qty 1

## 2023-11-24 MED ORDER — DEXAMETHASONE SODIUM PHOSPHATE 10 MG/ML IJ SOLN
INTRAMUSCULAR | Status: DC | PRN
  Administered 2023-11-24: 10 mg via INTRAVENOUS

## 2023-11-24 MED ORDER — CHLORHEXIDINE GLUCONATE CLOTH 2 % EX PADS
6.0000 | MEDICATED_PAD | Freq: Every day | CUTANEOUS | Status: DC
  Administered 2023-11-24 – 2023-11-27 (×4): 6 via TOPICAL

## 2023-11-24 MED ORDER — OXYCODONE HCL 5 MG/5ML PO SOLN: 5.0000 mg | Freq: Once | ORAL | Status: AC | PRN

## 2023-11-24 MED ORDER — FENTANYL CITRATE (PF) 100 MCG/2ML IJ SOLN
25.0000 ug | INTRAMUSCULAR | Status: DC | PRN
  Administered 2023-11-24: 25 ug via INTRAVENOUS

## 2023-11-24 MED ORDER — SUGAMMADEX SODIUM 200 MG/2ML IV SOLN
INTRAVENOUS | Status: DC | PRN
  Administered 2023-11-24: 326.4 mg via INTRAVENOUS

## 2023-11-24 MED ORDER — CEFAZOLIN SODIUM-DEXTROSE 2-4 GM/100ML-% IV SOLN
2.0000 g | Freq: Three times a day (TID) | INTRAVENOUS | Status: AC
  Administered 2023-11-24 – 2023-11-25 (×3): 2 g via INTRAVENOUS
  Filled 2023-11-24 (×3): qty 100

## 2023-11-24 MED ORDER — FLEET ENEMA RE ENEM: 1.0000 | ENEMA | Freq: Once | RECTAL | Status: DC | PRN

## 2023-11-24 MED ORDER — CEFAZOLIN SODIUM-DEXTROSE 2-4 GM/100ML-% IV SOLN
INTRAVENOUS | Status: AC
  Filled 2023-11-24: qty 100

## 2023-11-24 MED ORDER — MENTHOL 3 MG MT LOZG: 1.0000 | LOZENGE | OROMUCOSAL | Status: DC | PRN

## 2023-11-24 MED ORDER — BUPIVACAINE-EPINEPHRINE (PF) 0.25% -1:200000 IJ SOLN
INTRAMUSCULAR | Status: AC
  Filled 2023-11-24: qty 60

## 2023-11-24 MED ORDER — ONDANSETRON HCL 4 MG/2ML IJ SOLN
INTRAMUSCULAR | Status: DC | PRN
  Administered 2023-11-24: 4 mg via INTRAVENOUS

## 2023-11-24 MED ORDER — PHENYLEPHRINE 80 MCG/ML (10ML) SYRINGE FOR IV PUSH (FOR BLOOD PRESSURE SUPPORT)
PREFILLED_SYRINGE | INTRAVENOUS | Status: DC | PRN
  Administered 2023-11-24: 160 ug via INTRAVENOUS

## 2023-11-24 MED ORDER — METOCLOPRAMIDE HCL 5 MG PO TABS: 5.0000 mg | ORAL_TABLET | Freq: Three times a day (TID) | ORAL | Status: DC | PRN

## 2023-11-24 MED ORDER — FERROUS SULFATE 325 (65 FE) MG PO TABS
325.0000 mg | ORAL_TABLET | Freq: Every day | ORAL | Status: DC
  Administered 2023-11-25 – 2023-11-28 (×4): 325 mg via ORAL
  Filled 2023-11-24 (×4): qty 1

## 2023-11-24 MED ORDER — ENSURE ENLIVE PO LIQD
237.0000 mL | Freq: Two times a day (BID) | ORAL | Status: DC
Start: 2023-11-24 — End: 2023-11-28
  Administered 2023-11-24 – 2023-11-28 (×8): 237 mL via ORAL

## 2023-11-24 MED ORDER — GENTAMICIN SULFATE 40 MG/ML IJ SOLN
INTRAMUSCULAR | Status: AC
  Filled 2023-11-24: qty 2

## 2023-11-24 MED ORDER — ALUM & MAG HYDROXIDE-SIMETH 200-200-20 MG/5ML PO SUSP: 30.0000 mL | ORAL | Status: DC | PRN

## 2023-11-24 MED ORDER — TRANEXAMIC ACID-NACL 1000-0.7 MG/100ML-% IV SOLN
INTRAVENOUS | Status: AC
Start: 2023-11-24 — End: ?
  Filled 2023-11-24: qty 100

## 2023-11-24 MED ORDER — TRANEXAMIC ACID-NACL 1000-0.7 MG/100ML-% IV SOLN: 1000.0000 mg | INTRAVENOUS | Status: AC

## 2023-11-24 MED ORDER — PROPOFOL 10 MG/ML IV BOLUS
INTRAVENOUS | Status: AC
  Filled 2023-11-24: qty 20

## 2023-11-24 MED ORDER — ONDANSETRON HCL 4 MG PO TABS: 4.0000 mg | ORAL_TABLET | Freq: Four times a day (QID) | ORAL | Status: DC | PRN

## 2023-11-24 MED ORDER — EPHEDRINE SULFATE-NACL 50-0.9 MG/10ML-% IV SOSY
PREFILLED_SYRINGE | INTRAVENOUS | Status: DC | PRN
  Administered 2023-11-24 (×2): 10 mg via INTRAVENOUS

## 2023-11-24 MED ORDER — ENOXAPARIN SODIUM 30 MG/0.3ML IJ SOSY
30.0000 mg | PREFILLED_SYRINGE | INTRAMUSCULAR | Status: DC
  Administered 2023-11-25 – 2023-11-28 (×4): 30 mg via SUBCUTANEOUS
  Filled 2023-11-24 (×4): qty 0.3

## 2023-11-24 MED ORDER — INSULIN ASPART 100 UNIT/ML IJ SOLN
0.0000 [IU] | Freq: Three times a day (TID) | INTRAMUSCULAR | Status: DC
  Administered 2023-11-25: 5 [IU] via SUBCUTANEOUS
  Administered 2023-11-25: 3 [IU] via SUBCUTANEOUS
  Administered 2023-11-25: 2 [IU] via SUBCUTANEOUS
  Filled 2023-11-24 (×3): qty 1

## 2023-11-24 MED ORDER — FENTANYL CITRATE (PF) 100 MCG/2ML IJ SOLN
INTRAMUSCULAR | Status: AC
  Filled 2023-11-24: qty 2

## 2023-11-24 MED ORDER — ACETAMINOPHEN 10 MG/ML IV SOLN
INTRAVENOUS | Status: AC
  Filled 2023-11-24: qty 100

## 2023-11-24 MED ORDER — PHENOL 1.4 % MT LIQD: 1.0000 | OROMUCOSAL | Status: DC | PRN

## 2023-11-24 MED ORDER — SODIUM CHLORIDE 0.45 % IV SOLN: INTRAVENOUS | Status: AC

## 2023-11-24 MED ORDER — SODIUM CHLORIDE 0.9 % IR SOLN
Status: DC | PRN
  Administered 2023-11-24: 1000 mL

## 2023-11-24 MED ORDER — LACTATED RINGERS IV SOLN: INTRAVENOUS | Status: DC | PRN

## 2023-11-24 MED ORDER — PROPOFOL 10 MG/ML IV BOLUS
INTRAVENOUS | Status: DC | PRN
  Administered 2023-11-24: 120 mg via INTRAVENOUS

## 2023-11-24 MED ORDER — MORPHINE SULFATE (PF) 2 MG/ML IV SOLN
0.5000 mg | INTRAVENOUS | Status: DC | PRN
  Administered 2023-11-26: 0.5 mg via INTRAVENOUS
  Administered 2023-11-28: 1 mg via INTRAVENOUS
  Filled 2023-11-24 (×2): qty 1

## 2023-11-24 MED ORDER — ACETAMINOPHEN 10 MG/ML IV SOLN
INTRAVENOUS | Status: DC | PRN
  Administered 2023-11-24: 1000 mg via INTRAVENOUS

## 2023-11-24 MED ORDER — BISACODYL 10 MG RE SUPP
10.0000 mg | Freq: Every day | RECTAL | Status: DC | PRN
  Administered 2023-11-28: 10 mg via RECTAL
  Filled 2023-11-24: qty 1

## 2023-11-24 MED ORDER — CELECOXIB 200 MG PO CAPS
200.0000 mg | ORAL_CAPSULE | Freq: Two times a day (BID) | ORAL | Status: AC
  Filled 2023-11-24: qty 1

## 2023-11-24 MED ORDER — HYDROCODONE-ACETAMINOPHEN 5-325 MG PO TABS
1.0000 | ORAL_TABLET | ORAL | Status: DC | PRN
  Administered 2023-11-25: 1 via ORAL
  Administered 2023-11-25: 2 via ORAL
  Administered 2023-11-25 – 2023-11-26 (×3): 1 via ORAL
  Administered 2023-11-26 – 2023-11-28 (×4): 2 via ORAL
  Filled 2023-11-24 (×5): qty 2
  Filled 2023-11-24 (×2): qty 1
  Filled 2023-11-24: qty 2
  Filled 2023-11-24 (×2): qty 1

## 2023-11-24 MED ORDER — FENTANYL CITRATE (PF) 100 MCG/2ML IJ SOLN
INTRAMUSCULAR | Status: DC | PRN
  Administered 2023-11-24 (×2): 50 ug via INTRAVENOUS

## 2023-11-24 MED ORDER — SENNA 8.6 MG PO TABS
1.0000 | ORAL_TABLET | Freq: Two times a day (BID) | ORAL | Status: DC
Start: 2023-11-24 — End: 2023-11-28
  Administered 2023-11-24 – 2023-11-28 (×8): 8.6 mg via ORAL
  Filled 2023-11-24 (×8): qty 1

## 2023-11-24 MED ORDER — ROCURONIUM BROMIDE 100 MG/10ML IV SOLN
INTRAVENOUS | Status: DC | PRN
  Administered 2023-11-24: 50 mg via INTRAVENOUS

## 2023-11-24 MED ORDER — ONDANSETRON HCL 4 MG/2ML IJ SOLN: 4.0000 mg | Freq: Four times a day (QID) | INTRAMUSCULAR | Status: DC | PRN

## 2023-11-24 MED ORDER — LIDOCAINE HCL (CARDIAC) PF 100 MG/5ML IV SOSY
PREFILLED_SYRINGE | INTRAVENOUS | Status: DC | PRN
  Administered 2023-11-24: 80 mg via INTRAVENOUS

## 2023-11-24 MED ORDER — MIDAZOLAM HCL 2 MG/2ML IJ SOLN
INTRAMUSCULAR | Status: DC | PRN
  Administered 2023-11-24: 1 mg via INTRAVENOUS

## 2023-11-24 MED ORDER — BUPIVACAINE-EPINEPHRINE (PF) 0.25% -1:200000 IJ SOLN
INTRAMUSCULAR | Status: DC | PRN
  Administered 2023-11-24: 55 mL via PERINEURAL

## 2023-11-24 MED ORDER — HYDROCHLOROTHIAZIDE 25 MG PO TABS
25.0000 mg | ORAL_TABLET | Freq: Every day | ORAL | Status: DC
  Administered 2023-11-25 – 2023-11-28 (×4): 25 mg via ORAL
  Filled 2023-11-24 (×4): qty 1

## 2023-11-24 MED ORDER — METOCLOPRAMIDE HCL 5 MG/ML IJ SOLN: 5.0000 mg | Freq: Three times a day (TID) | INTRAMUSCULAR | Status: DC | PRN

## 2023-11-24 MED ORDER — OXYCODONE HCL 5 MG PO TABS
5.0000 mg | ORAL_TABLET | Freq: Once | ORAL | Status: AC | PRN
  Administered 2023-11-24: 5 mg via ORAL

## 2023-11-24 MED ORDER — MIDAZOLAM HCL 2 MG/2ML IJ SOLN
INTRAMUSCULAR | Status: AC
  Filled 2023-11-24: qty 2

## 2023-11-24 MED ORDER — STERILE WATER FOR IRRIGATION IR SOLN
Status: DC | PRN
  Administered 2023-11-24: 1000 mL

## 2023-11-24 MED ORDER — ACETAMINOPHEN 325 MG PO TABS
325.0000 mg | ORAL_TABLET | Freq: Four times a day (QID) | ORAL | Status: DC | PRN
  Administered 2023-11-26: 650 mg via ORAL
  Filled 2023-11-24: qty 2

## 2023-11-24 MED ORDER — MAGNESIUM HYDROXIDE 400 MG/5ML PO SUSP
30.0000 mL | Freq: Every day | ORAL | Status: DC | PRN
  Administered 2023-11-27: 30 mL via ORAL
  Filled 2023-11-24 (×2): qty 30

## 2023-11-24 SURGICAL SUPPLY — 36 items
BIT DRILL CALIBRATED 4.2 (BIT) IMPLANT
BIT DRILL CANN 16 HIP (BIT) IMPLANT
BIT DRILL CANN STP 6/9 HIP (BIT) IMPLANT
BIT DRILL TAPERED 10 (BIT) IMPLANT
BLADE TFNA HELICAL 95 NON STRL (Anchor) IMPLANT
BNDG COHESIVE 4X5 TAN STRL LF (GAUZE/BANDAGES/DRESSINGS) ×1 IMPLANT
CHLORAPREP W/TINT 26 (MISCELLANEOUS) ×2 IMPLANT
DRAPE C-ARMOR (DRAPES) IMPLANT
DRAPE INCISE 23X17 STRL (DRAPES) ×1 IMPLANT
DRAPE INCISE IOBAN 23X17 STRL (DRAPES) ×1 IMPLANT
ELECTRODE REM PT RTRN 9FT ADLT (ELECTROSURGICAL) ×1 IMPLANT
GAUZE SPONGE 4X4 12PLY STRL (GAUZE/BANDAGES/DRESSINGS) ×2 IMPLANT
GLOVE INDICATOR 8.0 STRL GRN (GLOVE) ×1 IMPLANT
GLOVE SURG ORTHO 8.5 STRL (GLOVE) ×1 IMPLANT
GOWN STRL REUS W/ TWL LRG LVL3 (GOWN DISPOSABLE) ×1 IMPLANT
GOWN STRL REUS W/TWL LRG LVL4 (GOWN DISPOSABLE) ×1 IMPLANT
GUIDEWIRE 3.2X400 (WIRE) IMPLANT
KIT TURNOVER KIT A (KITS) ×1 IMPLANT
MANIFOLD NEPTUNE II (INSTRUMENTS) ×1 IMPLANT
MAT ABSORB FLUID 56X50 GRAY (MISCELLANEOUS) ×1 IMPLANT
NAIL TROCH FIX 10X170 130 (Nail) IMPLANT
NDL SPNL 18GX3.5 QUINCKE PK (NEEDLE) ×1 IMPLANT
NEEDLE SPNL 18GX3.5 QUINCKE PK (NEEDLE) ×1 IMPLANT
NS IRRIG 500ML POUR BTL (IV SOLUTION) ×1 IMPLANT
PACK HIP COMPR (MISCELLANEOUS) ×1 IMPLANT
PAD ABD DERMACEA PRESS 5X9 (GAUZE/BANDAGES/DRESSINGS) ×2 IMPLANT
PENCIL SMOKE EVACUATOR (MISCELLANEOUS) IMPLANT
SCREW LOCK STAR 5X34 (Screw) IMPLANT
SOLUTION PREP PVP 2OZ (MISCELLANEOUS) ×1 IMPLANT
STAPLER SKIN PROX 35W (STAPLE) ×1 IMPLANT
SUCTION TUBE FRAZIER 10FR DISP (SUCTIONS) ×1 IMPLANT
SUT VIC AB 0 CT1 36 (SUTURE) ×1 IMPLANT
SUT VIC AB 2-0 CT1 (SUTURE) ×1 IMPLANT
SYR 30ML LL (SYRINGE) ×1 IMPLANT
TRAP FLUID SMOKE EVACUATOR (MISCELLANEOUS) ×1 IMPLANT
WATER STERILE IRR 500ML POUR (IV SOLUTION) ×1 IMPLANT

## 2023-11-24 NOTE — Anesthesia Postprocedure Evaluation (Signed)
 Anesthesia Post Note  Patient: MAKIA BOSSI  Procedure(s) Performed: FIXATION, FRACTURE, INTERTROCHANTERIC, WITH INTRAMEDULLARY ROD (Left: Hip)  Patient location during evaluation: PACU Anesthesia Type: General Level of consciousness: awake and alert Pain management: pain level controlled Vital Signs Assessment: post-procedure vital signs reviewed and stable Respiratory status: spontaneous breathing, nonlabored ventilation, respiratory function stable and patient connected to nasal cannula oxygen Cardiovascular status: blood pressure returned to baseline and stable Postop Assessment: no apparent nausea or vomiting Anesthetic complications: no   No notable events documented.   Last Vitals:  Vitals:   11/24/23 1415 11/24/23 1430  BP: 120/72 125/60  Pulse: 67 73  Resp: (!) 6 15  Temp:    SpO2: 99% 100%    Last Pain:  Vitals:   11/24/23 1430  TempSrc:   PainSc: 0-No pain                 Portia Brittle Paulene Tayag

## 2023-11-24 NOTE — Hospital Course (Signed)
 16XW with h/o thyroid  CA s/p RAI, HTN, DM, and lower eyelid melanoma s/p Mohs (04/2023) who presented on 5/23 with a mechanical fall resulting in a left intertrochanteric hip fracture.  ORIF on 5/24.

## 2023-11-24 NOTE — Anesthesia Preprocedure Evaluation (Signed)
 Anesthesia Evaluation  Patient identified by MRN, date of birth, ID band Patient awake    Reviewed: Allergy & Precautions, NPO status , Patient's Chart, lab work & pertinent test results  History of Anesthesia Complications (+) PONV and history of anesthetic complications  Airway Mallampati: III  TM Distance: >3 FB Neck ROM: full    Dental  (+) Chipped   Pulmonary neg pulmonary ROS, neg shortness of breath   Pulmonary exam normal        Cardiovascular Exercise Tolerance: Good hypertension, Normal cardiovascular exam     Neuro/Psych  Headaches    GI/Hepatic Neg liver ROS,GERD  Controlled,,  Endo/Other  diabetes, Type 2Hypothyroidism    Renal/GU      Musculoskeletal   Abdominal   Peds  Hematology negative hematology ROS (+)   Anesthesia Other Findings Past Medical History: No date: Arthritis     Comment:  knees No date: Cancer (HCC)     Comment:  Thyroid  No date: Complication of anesthesia No date: Depression No date: Diabetes mellitus type 2 in obese No date: Dyspnea No date: GERD (gastroesophageal reflux disease) No date: Headache     Comment:  migraines - 3-4x/yr No date: HLD (hyperlipidemia) No date: HTN (hypertension) No date: Hypothyroidism     Comment:  no thyroid  No date: Localized swelling of both lower legs     Comment:  leaky veins No date: Lower extremity edema No date: Neck pain     Comment:  left side No date: Numbness and tingling     Comment:  numbness in hands and face tingles No date: PONV (postoperative nausea and vomiting) No date: Pulmonary embolus (HCC)     Comment:  after thyroid  surgery No date: Vertigo     Comment:  no episodes in approx 2 yrs  Past Surgical History: No date: c-sections     Comment:  x2  08/27/2017: CATARACT EXTRACTION W/PHACO; Left     Comment:  Procedure: CATARACT EXTRACTION PHACO AND INTRAOCULAR               LENS PLACEMENT (IOC) LEFT DIABETIC;   Surgeon: Annell Kidney, MD;  Location: [facility];  Service:               Ophthalmology;  Laterality: Left;  Diabetic - oral meds 11/10/2015: COLONOSCOPY; N/A     Comment:  Procedure: COLONOSCOPY;  Surgeon: Stephens Eis, MD;                Location: [facility];  Service:               Gastroenterology;  Laterality: N/A; 11/10/2015: ESOPHAGOGASTRODUODENOSCOPY; N/A     Comment:  Procedure: ESOPHAGOGASTRODUODENOSCOPY (EGD) with               dialation;  Surgeon: Stephens Eis, MD;  Location: [facility]               SURGERY [facility];  Service: Gastroenterology;  Laterality:               N/A; 03/17/2019: IVC FILTER INSERTION; N/A     Comment:  Procedure: IVC FILTER INSERTION;  Surgeon: Celso College,              MD;  Location: [facility] INVASIVE CV LAB;  Service:               Cardiovascular;  Laterality: N/A; 06/05/2019: IVC FILTER  REMOVAL; N/A     Comment:  Procedure: IVC FILTER REMOVAL;  Surgeon: Celso College,               MD;  Location: [facility] IN[facility] CV LAB;  Service:               Cardiovascular;  Laterality: N/A; 03/24/2019: KNEE ARTHROPLASTY; Left     Comment:  Procedure: COMPUTER ASSISTED TOTAL KNEE ARTHROPLASTY -               RNFA;  Surgeon: Arlyne Lame, MD;  Location: [facility] ORS;              Service: Orthopedics;  Laterality: Left; 01/12/2021: SACROPLASTY; N/A     Comment:  Procedure: SACROPLASTY;  Surgeon: Molli Angelucci, MD;                Location: [facility] ORS;  Service: Orthopedics;  Laterality:               N/A; 11/15/2018: SHOULDER ARTHROSCOPY; Left     Comment:  Procedure: ARTHROSCOPY SHOULDER WITH DEBRIDEMENT               DECOMPRESSION, ROTATOR CUFF REPAIR AND POSSIBLE BICEPS               TENODESIS;  Surgeon: Elner Hahn, MD;  Location: [facility];  Service: Orthopedics;  Laterality: Left;                SMITH AND NEPHEW REGENTEN PATCH No date: THYROIDECTOMY     Reproductive/Obstetrics negative OB ROS                              Anesthesia Physical Anesthesia Plan  ASA: 3  Anesthesia Plan: General ETT   Post-op Pain Management:    Induction: Intravenous  PONV Risk Score and Plan: Ondansetron , Dexamethasone , Midazolam  and Treatment may vary due to age or medical condition  Airway Management Planned: Oral ETT  Additional Equipment:   Intra-op Plan:   Post-operative Plan: Extubation in OR  Informed Consent: I have reviewed the patients History and Physical, chart, labs and discussed the procedure including the risks, benefits and alternatives for the proposed anesthesia with the patient or authorized representative who has indicated his/her understanding and acceptance.     Dental Advisory Given  Plan Discussed with: Anesthesiologist, CRNA and Surgeon  Anesthesia Plan Comments: (Patient declines spinal  Patient consented for risks of anesthesia including but not limited to:  - adverse reactions to medications - damage to eyes, teeth, lips or other oral mucosa - nerve damage due to positioning  - sore throat or hoarseness - Damage to heart, brain, nerves, lungs, other parts of body or loss of life  Patient voiced understanding and assent.)       Anesthesia Quick Evaluation

## 2023-11-24 NOTE — Anesthesia Procedure Notes (Signed)
 Procedure Name: Intubation Date/Time: 11/24/2023 11:01 AM  Performed by: Racheal Buddle, CRNAPre-anesthesia Checklist: Patient identified, Patient being monitored, Timeout performed, Emergency Drugs available and Suction available Patient Re-evaluated:Patient Re-evaluated prior to induction Oxygen Delivery Method: Circle system utilized Preoxygenation: Pre-oxygenation with 100% oxygen Induction Type: IV induction Ventilation: Mask ventilation without difficulty Laryngoscope Size: Mac and 3 Grade View: Grade I Tube type: Oral Tube size: 7.0 mm Number of attempts: 1 Airway Equipment and Method: Stylet Placement Confirmation: ETT inserted through vocal cords under direct vision, positive ETCO2 and breath sounds checked- equal and bilateral Secured at: 21 cm Tube secured with: Tape Dental Injury: Teeth and Oropharynx as per pre-operative assessment

## 2023-11-24 NOTE — H&P (Signed)
THE PATIENT WAS SEEN PRIOR TO SURGERY TODAY.  HISTORY, ALLERGIES, HOME MEDICATIONS AND OPERATIVE PROCEDURE WERE REVIEWED. RISKS AND BENEFITS OF SURGERY DISCUSSED WITH PATIENT AGAIN.  NO CHANGES FROM INITIAL HISTORY AND PHYSICAL NOTED.    

## 2023-11-24 NOTE — Progress Notes (Signed)
 Progress Note   Patient: Alisha Sanchez UXL:[id number] DOB: [birth year] DOA: 11/23/2023     1 DOS: the patient was seen and examined on 11/24/2023   Brief hospital course: 68yo with h/o thyroid  CA s/p RAI, HTN, DM, and lower eyelid melanoma s/p Mohs (04/2023) who presented on 5/23 with a mechanical fall resulting in a left intertrochanteric hip fracture.  ORIF on 5/24.  Assessment and Plan:  Closed intertrochanteric fracture of hip, left, initial encounter  Mechanical fall Pain control Orthopedics consulted ORIF on 5/24 Will need PT/OT post-operatively On alendronate for osteoporosis; consider alternative therapy   Type 2 diabetes mellitus with hyperglycemia, without long-term current use of insulin   A1c is 6.7, good control Hold glipizide, Jaumet Cover with moderate-scale SSI Carb modified diet    Chronic pain disorder I have reviewed this patient in the [place] Controlled Substances Reporting System.  She is receiving medications from only one provider and is only receiving tramadol  periodically She is not at particularly high risk of opioid misuse, diversion, or overdose.    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   Thyroid  cancer s/p radioactive iodine  2009  Continue levothyroxine  112 mcg   History of pulmonary embolism S/p IVC filter Not currently on anticoagulants   Essential hypertension Continue amlodipine  Resume hydrochlorothiazide  on 5/25  HLD Continue rosuvastatin   Hypothyroidism Continue Synthroid   Mood d/o Continue citalopram     Consultants: Orthopedics  Procedures: Hip repair 5/24  Antibiotics: Cefazolin  x 1  30 Day Unplanned Readmission Risk Score    Flowsheet Row ED to Hosp-Admission (Current) from 11/23/2023 in [facility] (1A)  30 Day Unplanned Readmission Risk Score (%) 12.28 Filed at 11/24/2023 0400       This score is the patient's risk of an unplanned readmission  within 30 days of being discharged (0 -100%). The score is based on dignosis, age, lab data, medications, orders, and past utilization.   Low:  0-14.9   Medium: 15-21.9   High: 22-29.9   Extreme: 30 and above           Subjective: Seen in PACU.  L hip post-operative discomfort.  No other issues.   Objective: Vitals:   11/24/23 1245 11/24/23 1300  BP: (!) 115/58 119/69  Pulse: 71 75  Resp: 10 19  Temp:    SpO2: 100% 95%    Intake/Output Summary (Last 24 hours) at 11/24/2023 1312 Last data filed at 11/24/2023 1216 Gross per 24 hour  Intake 1064.69 ml  Output 110 ml  Net 954.69 ml   Filed Weights   11/24/23 1035  Weight: 81.6 kg    Exam:  General:  Appears calm and comfortable and is in NAD Eyes:  normal lids, iris ENT:  grossly normal hearing, lips & tongue, mmm Cardiovascular:  RRR, no m/r/g. No LE edema.  Respiratory:   CTA bilaterally with no wheezes/rales/rhonchi.  Normal respiratory effort. Abdomen:  soft, NT, ND Skin:  no rash or induration seen on limited exam Musculoskeletal:  bandage on L hip is C/D/I Psychiatric:  somnolent mood and affect, speech sparse but appropriate Neurologic:  CN 2-12 grossly intact, moves all extremities in coordinated fashion  Data Reviewed: I have reviewed the patient's lab results since admission.  Pertinent labs for today include:   A1c 6.7     Family Communication: None present  Disposition: Status is: Inpatient Remains inpatient appropriate because: post-operative     Time spent: 50 minutes  Unresulted Labs (From admission,  onward)     Start     Ordered   11/25/23 0500  CBC with Differential/Platelet  Tomorrow morning,   R        11/24/23 1310   11/25/23 0500  Basic metabolic panel with GFR  Tomorrow morning,   R        11/24/23 1310   Signed and Held  CBC  (enoxaparin  (LOVENOX )  CrCl < 30 mL/min)  Once,   R       Comments: Baseline for enoxaparin  therapy IF NOT ALREADY DRAWN. Notify MD if PLT < 100 K.     Signed and Held             Author: Lorita Rosa, MD 11/24/2023 1:12 PM  For on call review [email or web address].

## 2023-11-24 NOTE — Consult Note (Signed)
 ORTHOPAEDIC CONSULTATION  REQUESTING PHYSICIAN: Lorita Rosa, MD  Chief Complaint: Left hip pain  HPI: Alisha Sanchez is a 68 y.o. female who complains of left hip pain after a fall yesterday evening while walking her dog.  The patient was brought to the emergency room where exam and x-rays revealed a slightly displaced intertrochanteric fracture of the left hip.  She has been admitted for medical evaluation and surgical stabilization of the fracture.  Patient has a history of pulmonary emboli in the past and has a inferior vena cava filter.  She is not currently on blood thinners.  She has had knee replacements in the past.  The risks and benefits of surgery were discussed with the patient and she wished to proceed.  Postop protocol was discussed as well.  She been cleared for surgery by the medical service.  Past Medical History:  Diagnosis Date   Arthritis    knees   Cancer (HCC)    Thyroid    Complication of anesthesia    Depression    Diabetes mellitus type 2 in obese    Dyspnea    GERD (gastroesophageal reflux disease)    Headache    migraines - 3-4x/yr   HLD (hyperlipidemia)    HTN (hypertension)    Hypothyroidism    no thyroid    Localized swelling of both lower legs    leaky veins   Lower extremity edema    Neck pain    left side   Numbness and tingling    numbness in hands and face tingles   PONV (postoperative nausea and vomiting)    Pulmonary embolus (HCC)    after thyroid  surgery   Vertigo    no episodes in approx 2 yrs   Past Surgical History:  Procedure Laterality Date   c-sections     x2    CATARACT EXTRACTION W/PHACO Left 08/27/2017   Procedure: CATARACT EXTRACTION PHACO AND INTRAOCULAR LENS PLACEMENT (IOC) LEFT DIABETIC;  Surgeon: Annell Kidney, MD;  Location: [facility];  Service: Ophthalmology;  Laterality: Left;  Diabetic - oral meds   COLONOSCOPY N/A 11/10/2015   Procedure: COLONOSCOPY;  Surgeon: Stephens Eis, MD;  Location:  [facility];  Service: Gastroenterology;  Laterality: N/A;   ESOPHAGOGASTRODUODENOSCOPY N/A 11/10/2015   Procedure: ESOPHAGOGASTRODUODENOSCOPY (EGD) with dialation;  Surgeon: Stephens Eis, MD;  Location: [facility];  Service: Gastroenterology;  Laterality: N/A;   IVC FILTER INSERTION N/A 03/17/2019   Procedure: IVC FILTER INSERTION;  Surgeon: Celso College, MD;  Location: [facility] IN[facility];  Service: Cardiovascular;  Laterality: N/A;   IVC FILTER REMOVAL N/A 06/05/2019   Procedure: IVC FILTER REMOVAL;  Surgeon: Celso College, MD;  Location: [facility] CV [facility];  Service: Cardiovascular;  Laterality: N/A;   KNEE ARTHROPLASTY Left 03/24/2019   Procedure: COMPUTER ASSISTED TOTAL KNEE ARTHROPLASTY - RNFA;  Surgeon: Arlyne Lame, MD;  Location: [facility] ORS;  Service: Orthopedics;  Laterality: Left;   SACROPLASTY N/A 01/12/2021   Procedure: SACROPLASTY;  Surgeon: Molli Angelucci, MD;  Location: [facility] ORS;  Service: Orthopedics;  Laterality: N/A;   SHOULDER ARTHROSCOPY Left 11/15/2018   Procedure: ARTHROSCOPY SHOULDER WITH DEBRIDEMENT DECOMPRESSION, ROTATOR CUFF REPAIR AND POSSIBLE BICEPS TENODESIS;  Surgeon: Elner Hahn, MD;  Location: [facility];  Service: Orthopedics;  Laterality: Left;  SMITH AND NEPHEW REGENTEN PATCH   THYROIDECTOMY     Social History   Socioeconomic History   Marital status: Married    Spouse name: Not on file  Number of children: Not on file   Years of education: Not on file   Highest education level: Not on file  Occupational History   Not on file  Tobacco Use   Smoking status: Never    Passive exposure: Never   Smokeless tobacco: Never   Tobacco comments:    tobacco use- no  Vaping Use   Vaping status: Never Used  Substance and Sexual Activity   Alcohol use: Yes    Alcohol/week: 1.0 standard drink of alcohol    Types: 1 Glasses of wine per week    Comment: occassional wine   Drug use: No   Sexual activity: Not on file  Other Topics Concern    Not on file  Social History Narrative   Full time. Regularly exercises.    Social Drivers of Health   Financial Resource Strain: Patient Declined (09/27/2023)   Received from [facility]   Overall Financial Resource Strain (CARDIA)    Difficulty of Paying Living Expenses: Patient declined  Food Insecurity: Patient Declined (09/27/2023)   Received from [facility]   Hunger Vital Sign    Worried About Running Out of Food in the Last Year: Patient declined    Ran Out of Food in the Last Year: Patient declined  Transportation Needs: Patient Declined (09/27/2023)   Received from [facility]    In the past 12 months, has lack of transportation kept you from medical appointments or from getting medications?: Patient declined    Lack of Transportation (Non-Medical): Patient declined  Physical Activity: Not on file  Stress: Not on file  Social Connections: Not on file   Family History  Problem Relation Age of Onset   Liver cancer Father    Lung cancer Father    Diabetes Father    Stroke Other    Breast cancer Maternal Aunt 32   Breast cancer Paternal Aunt 50   No Known Allergies Prior to Admission medications   Medication Sig Start Date End Date Taking? Authorizing Provider  alendronate (FOSAMAX) 70 MG tablet Take 70 mg by mouth once a week. Take with a full glass of water  on an empty stomach.    [provider]  amLODipine  (NORVASC ) 5 MG tablet Take 5 mg by mouth at bedtime.     [provider]  celecoxib  (CELEBREX ) 100 MG capsule Take 100 mg by mouth 2 (two) times daily. 07/26/22   [provider]  citalopram (CELEXA) 10 MG tablet Take 1 tablet by mouth daily. 07/29/22 09/19/23  [provider]  estradiol  (ESTRACE ) 0.1 MG/GM vaginal cream Estrogen Cream Instruction Discard applicator Apply pea sized amount to tip of finger to urethra before bed. Wash hands well after  application. Use Monday, Wednesday and Friday 09/19/23   Lawerence Pressman, MD  glipiZIDE (GLUCOTROL XL) 2.5 MG 24 hr tablet Take 2.5 mg by mouth daily. 11/25/20   [provider]  hydrochlorothiazide  (HYDRODIURIL ) 25 MG tablet Take 25 mg by mouth daily.    [provider]  JANUMET  50-1000 MG tablet Take 1 tablet by mouth 2 (two) times daily. 01/18/22   [provider]  Melatonin 10 MG TABS Take 1 tablet by mouth at bedtime as needed.    [provider]  Multiple Vitamins-Minerals (MULTIVITAMIN ADULT PO) Take 2 tablets by mouth daily.     [provider]  oxybutynin  (DITROPAN -XL) 10 MG 24 hr tablet Take 1 tablet (10 mg total)  by mouth at bedtime. 09/19/23   Lawerence Pressman, MD  pantoprazole  (PROTONIX ) 40 MG tablet Take 40 mg by mouth daily.    [provider]  polyethylene glycol (MIRALAX  / GLYCOLAX ) 17 g packet Take 17 g by mouth daily as needed for mild constipation. 01/14/21   Akula, Vijaya, MD  rosuvastatin  (CRESTOR ) 10 MG tablet Take 10 mg by mouth at bedtime.     [provider]  SYNTHROID  112 MCG tablet Take 112 mcg by mouth daily. 12/20/21   [provider]  traMADol  (ULTRAM ) 50 MG tablet Take 50 mg by mouth every 6 (six) hours as needed. 08/07/22   [provider]  zolpidem (AMBIEN) 5 MG tablet Take 5 mg by mouth at bedtime. 07/31/22   [provider]   DG HIP UNILAT WITH PELVIS 2-3 VIEWS LEFT Result Date: 11/23/2023 CLINICAL DATA:  Status post fall. EXAM: DG HIP (WITH OR WITHOUT PELVIS) 2-3V LEFT COMPARISON:  None Available. FINDINGS: Acute nondisplaced fracture deformity is seen extending through the inter trochanteric region of the proximal left femur. There is no evidence of dislocation. Degenerative changes seen in the form of joint space narrowing and acetabular sclerosis. IMPRESSION: Acute nondisplaced intertrochanteric fracture of the proximal left femur. Electronically Signed   By: Virgle Grime M.D.    On: 11/23/2023 23:11   DG Chest 1 View Result Date: 11/23/2023 CLINICAL DATA:  Status post fall. EXAM: CHEST  1 VIEW COMPARISON:  December 23, 2020 FINDINGS: The heart size and mediastinal contours are within normal limits. Both lungs are clear. Multiple chronic left-sided rib fractures are seen. Multilevel degenerative changes seen throughout the thoracic spine. IMPRESSION: No active cardiopulmonary disease. Electronically Signed   By: Virgle Grime M.D.   On: 11/23/2023 22:54    Positive ROS: All other systems have been reviewed and were otherwise negative with the exception of those mentioned in the HPI and as above.  Physical Exam: General: Alert, no acute distress Cardiovascular: No pedal edema Respiratory: No cyanosis, no use of accessory musculature GI: No organomegaly, abdomen is soft and non-tender Skin: No lesions in the area of chief complaint Neurologic: Sensation intact distally Psychiatric: Patient is competent for consent with normal mood and affect Lymphatic: No axillary or cervical lymphadenopathy  MUSCULOSKELETAL: Patient is alert and and cooperative and oriented.  She is lying quietly in her hospital bed.  The left leg is slightly shortened and externally rotated.  There is pain with any attempts at movement of the hip.  The skin is intact.  Neurovascular status is normal distally.  The right lower extremity is normal to exam.  The upper extremities are both normal.  Head neck and spine are nontender.  Assessment: Displaced intertrochanteric fracture left hip  Plan: Open reduction internal fixation left hip with trochanteric fixation nail We will restart anticoagulation tomorrow and use SCD compression hose.    Oletha Bernheim, MD [phone]   11/24/2023 10:08 AM

## 2023-11-24 NOTE — Op Note (Signed)
 DATE OF SURGERY:  11/24/2023  TIME: 12:11 PM  PATIENT NAME:  Alisha Sanchez  AGE: 68 y.o.  PRE-OPERATIVE DIAGNOSIS:  left hip fracture displaced intertrochanteric  POST-OPERATIVE DIAGNOSIS:  SAME  PROCEDURE:  FIXATION, FRACTURE, left hip INTERTROCHANTERIC, WITH INTRAMEDULLARY ROD  SURGEON:  Oletha Bernheim  ASST:  EBL: 100 cc  COMPLICATIONS: None  OPERATIVE IMPLANTS: Synthes trochanteric femoral nail 10 mm / 130 degree with interlocking helical blade 95  mm and distal locking screw 34 mm.  PREOPERATIVE INDICATIONS:  Alisha Sanchez is a 68 y.o. year old who fell and suffered a hip fracture. She was brought into the ER and then admitted and optimized and then elected for surgical intervention.    The risks benefits and alternatives were discussed with the patient including but not limited to the risks of nonoperative treatment, versus surgical intervention including infection, bleeding, nerve injury, malunion, nonunion, hardware prominence, hardware failure, need for hardware removal, blood clots, cardiopulmonary complications, morbidity, mortality, among others, and they were willing to proceed.    OPERATIVE PROCEDURE:  The patient was brought to the operating room and placed in the supine position.  General endotracheal anesthesia was administered, with a foley. She was placed on the fracture table.  Closed reduction was performed under C-arm guidance. The length of the femur was also measured using fluoroscopy. Time out was then performed after sterile prep and drape. She received preoperative antibiotics.  Incision was made proximal to the greater trochanter. A guidewire was placed in the appropriate position. Confirmation was made on AP and lateral views. The above-named nail was opened. I opened the proximal femur with a reamer. I then placed the nail by hand easily down. I did not need to ream the femur.  Once the nail was completely seated, I placed a guidepin into  the femoral head into the center center position through a second incision.  I measured the length, and then reamed the lateral cortex and up into the head. I then placed the helical blade. Slight compression was applied. Anatomic fixation achieved. Bone quality was mediocre.  I then secured the proximal interlock.  The distal locking screw was then placed and after confirming the position of the fracture fragments and hardware I then removed the instruments, and took final C-arm pictures AP and lateral the entire length of the leg. Anatomic reconstruction was achieved, and the wounds were irrigated copiously and closed with Vicryl  followed by staples and dry sterile dressing. Sponge and needle count were correct.   The patient was awakened and returned to PACU in stable and satisfactory condition. There no complications and the patient tolerated the procedure well.  She will be partial weightbearing as tolerated, and will be on Lovenox   For DVT prophylaxis.     Oletha Bernheim, M.D.

## 2023-11-24 NOTE — Transfer of Care (Signed)
 Immediate Anesthesia Transfer of Care Note  Patient: Alisha Sanchez  Procedure(s) Performed: Procedure(s): FIXATION, FRACTURE, INTERTROCHANTERIC, WITH INTRAMEDULLARY ROD (Left)  Patient Location: PACU  Anesthesia Type:General  Level of Consciousness: sedated  Airway & Oxygen Therapy: Patient Spontanous Breathing and Patient connected to face mask oxygen  Post-op Assessment: Report given to RN and Post -op Vital signs reviewed and stable  Post vital signs: Reviewed and stable  Last Vitals:  Vitals:   11/24/23 0839 11/24/23 1218  BP: 122/69 (!) 118/57  Pulse: 62 74  Resp:  14  Temp: 36.4 C 36.4 C  SpO2: 100% 99%    Complications: No apparent anesthesia complications

## 2023-11-24 NOTE — Progress Notes (Signed)
 Initial Nutrition Assessment  DOCUMENTATION CODES:   Not applicable  INTERVENTION:  Once diet resumes, recommend: Regular diet  Ensure Enlive po BID, each supplement provides 350 kcal and 20 grams of protein.  NUTRITION DIAGNOSIS:   Increased nutrient needs related to hip fracture, acute illness as evidenced by estimated needs.  GOAL:   Patient will meet greater than or equal to 90% of their needs  MONITOR:   Diet advancement, Labs, Weight trends, Supplement acceptance, Skin  REASON FOR ASSESSMENT:   Consult Hip fracture protocol  ASSESSMENT:   Pt admitted from home s/p fall leading to L hip fracture. PMH significant for thyroid  cancer treated with radioactive iodine , HTN, DM, malignant melanoma of lower eyelid s/p Mohs (04/2023).  5/24- L hip intertrochantic fixation, with IM rod  RD working remotely and pt off unit for surgical procedure.  Unable to obtain detailed nutrition related history at this time.   Pt notably in her usual state of health PTA.   2 weights on file to review within the last year. No significant weight changes noted per limited history.  Current weight: 81.6 kg  Medications: SSI 0-15 units q4h  Labs: reviewed CBG's 127-172 x24 hours   NUTRITION - FOCUSED PHYSICAL EXAM: RD working remotely. Deferred to follow up.   Diet Order:   Diet Order             Diet NPO time specified  Diet effective midnight                   EDUCATION NEEDS:   No education needs have been identified at this time  Skin:  Skin Assessment: Reviewed RN Assessment  Last BM:  PTA  Height:   Ht Readings from Last 1 Encounters:  09/19/23 5\' 7"  (1.702 m)    Weight:   Wt Readings from Last 1 Encounters:  11/24/23 81.6 kg   BMI:  Body mass index is 28.18 kg/m.  Estimated Nutritional Needs:   Kcal:  1800-2000  Protein:  100-110g  Fluid:  >/=1.8L  Allie Davinity Fanara, RDN, LDN Clinical Nutrition See AMiON for contact information.

## 2023-11-24 NOTE — Plan of Care (Signed)
 Problem: Education: Goal: Ability to describe self-care measures that may prevent or decrease complications (Diabetes Survival Skills Education) will improve 11/24/2023 0351 by Court Distance, RN Outcome: Progressing 11/24/2023 0350 by Court Distance, RN Outcome: Progressing Goal: Individualized Educational Video(s) 11/24/2023 0351 by Court Distance, RN Outcome: Progressing 11/24/2023 0350 by Court Distance, RN Outcome: Progressing   Problem: Coping: Goal: Ability to adjust to condition or change in health will improve 11/24/2023 0351 by Court Distance, RN Outcome: Progressing 11/24/2023 0350 by Court Distance, RN Outcome: Progressing   Problem: Fluid Volume: Goal: Ability to maintain a balanced intake and output will improve 11/24/2023 0351 by Court Distance, RN Outcome: Progressing 11/24/2023 0350 by Court Distance, RN Outcome: Progressing   Problem: Health Behavior/Discharge Planning: Goal: Ability to identify and utilize available resources and services will improve 11/24/2023 0351 by Court Distance, RN Outcome: Progressing 11/24/2023 0350 by Court Distance, RN Outcome: Progressing Goal: Ability to manage health-related needs will improve 11/24/2023 0352 by Court Distance, RN Outcome: Progressing 11/24/2023 0351 by Court Distance, RN Outcome: Progressing 11/24/2023 0350 by Court Distance, RN Outcome: Progressing   Problem: Metabolic: Goal: Ability to maintain appropriate glucose levels will improve 11/24/2023 0352 by Court Distance, RN Outcome: Progressing 11/24/2023 0351 by Court Distance, RN Outcome: Progressing 11/24/2023 0350 by Court Distance, RN Outcome: Progressing   Problem: Nutritional: Goal: Maintenance of adequate nutrition will improve 11/24/2023 0352 by Court Distance, RN Outcome: Progressing 11/24/2023 0351 by Court Distance, RN Outcome: Progressing 11/24/2023 0350 by Court Distance, RN Outcome: Progressing Goal: Progress  toward achieving an optimal weight will improve 11/24/2023 0352 by Court Distance, RN Outcome: Progressing 11/24/2023 0351 by Court Distance, RN Outcome: Progressing 11/24/2023 0350 by Court Distance, RN Outcome: Progressing   Problem: Skin Integrity: Goal: Risk for impaired skin integrity will decrease 11/24/2023 0352 by Court Distance, RN Outcome: Progressing 11/24/2023 0351 by Court Distance, RN Outcome: Progressing 11/24/2023 0350 by Court Distance, RN Outcome: Progressing   Problem: Tissue Perfusion: Goal: Adequacy of tissue perfusion will improve 11/24/2023 0352 by Court Distance, RN Outcome: Progressing 11/24/2023 0351 by Court Distance, RN Outcome: Progressing 11/24/2023 0350 by Court Distance, RN Outcome: Progressing   Problem: Education: Goal: Knowledge of General Education information will improve Description: Including pain rating scale, medication(s)/side effects and non-pharmacologic comfort measures 11/24/2023 0352 by Court Distance, RN Outcome: Progressing 11/24/2023 0351 by Court Distance, RN Outcome: Progressing 11/24/2023 0350 by Court Distance, RN Outcome: Progressing   Problem: Health Behavior/Discharge Planning: Goal: Ability to manage health-related needs will improve 11/24/2023 0352 by Court Distance, RN Outcome: Progressing 11/24/2023 0351 by Court Distance, RN Outcome: Progressing 11/24/2023 0350 by Court Distance, RN Outcome: Progressing   Problem: Clinical Measurements: Goal: Ability to maintain clinical measurements within normal limits will improve Outcome: Progressing Goal: Will remain free from infection Outcome: Progressing Goal: Diagnostic test results will improve Outcome: Progressing Goal: Respiratory complications will improve Outcome: Progressing Goal: Cardiovascular complication will be avoided Outcome: Progressing   Problem: Activity: Goal: Risk for activity intolerance will decrease 11/24/2023 0352 by  Court Distance, RN Outcome: Progressing 11/24/2023 0351 by Court Distance, RN Outcome: Progressing 11/24/2023 0350 by Court Distance, RN Outcome: Not Progressing   Problem: Nutrition: Goal: Adequate nutrition will be maintained 11/24/2023 0352 by Court Distance, RN Outcome: Progressing 11/24/2023 0351 by Court Distance, RN Outcome:  Progressing 11/24/2023 0350 by Court Distance, RN Outcome: Progressing   Problem: Coping: Goal: Level of anxiety will decrease 11/24/2023 0352 by Court Distance, RN Outcome: Progressing 11/24/2023 0351 by Court Distance, RN Outcome: Progressing 11/24/2023 0350 by Court Distance, RN Outcome: Progressing   Problem: Elimination: Goal: Will not experience complications related to bowel motility 11/24/2023 0352 by Court Distance, RN Outcome: Progressing 11/24/2023 0351 by Court Distance, RN Outcome: Progressing 11/24/2023 0350 by Court Distance, RN Outcome: Progressing Goal: Will not experience complications related to urinary retention 11/24/2023 0352 by Court Distance, RN Outcome: Progressing 11/24/2023 0351 by Court Distance, RN Outcome: Progressing 11/24/2023 0350 by Court Distance, RN Outcome: Progressing   Problem: Pain Managment: Goal: General experience of comfort will improve and/or be controlled 11/24/2023 0352 by Court Distance, RN Outcome: Progressing 11/24/2023 0351 by Court Distance, RN Outcome: Progressing 11/24/2023 0350 by Court Distance, RN Outcome: Progressing   Problem: Safety: Goal: Ability to remain free from injury will improve 11/24/2023 0352 by Court Distance, RN Outcome: Progressing 11/24/2023 0351 by Court Distance, RN Outcome: Progressing 11/24/2023 0350 by Court Distance, RN Outcome: Progressing   Problem: Skin Integrity: Goal: Risk for impaired skin integrity will decrease 11/24/2023 0352 by Court Distance, RN Outcome: Progressing 11/24/2023 0351 by Court Distance, RN Outcome:  Progressing 11/24/2023 0350 by Court Distance, RN Outcome: Progressing

## 2023-11-25 DIAGNOSIS — S72142A Displaced intertrochanteric fracture of left femur, initial encounter for closed fracture: Secondary | ICD-10-CM | POA: Diagnosis not present

## 2023-11-25 LAB — BASIC METABOLIC PANEL WITH GFR
Anion gap: 11 (ref 5–15)
BUN: 9 mg/dL (ref 8–23)
CO2: 24 mmol/L (ref 22–32)
Calcium: 8.5 mg/dL — ABNORMAL LOW (ref 8.9–10.3)
Chloride: 105 mmol/L (ref 98–111)
Creatinine, Ser: 0.55 mg/dL (ref 0.44–1.00)
GFR, Estimated: >60 mL/min (ref >60)
Glucose, Bld: 168 mg/dL — ABNORMAL HIGH (ref 70–99)
Potassium: 3.7 mmol/L (ref 3.5–5.1)
Sodium: 140 mmol/L (ref 135–145)

## 2023-11-25 LAB — CBC WITH DIFFERENTIAL/PLATELET
Abs Immature Granulocytes: 0.09 10*3/uL — ABNORMAL HIGH (ref 0.00–0.07)
Basophils Absolute: 0 10*3/uL (ref 0.0–0.1)
Basophils Relative: 0 %
Eosinophils Absolute: 0 10*3/uL (ref 0.0–0.5)
Eosinophils Relative: 0 %
HCT: 27.9 % — ABNORMAL LOW (ref 36.0–46.0)
Hemoglobin: 8.7 g/dL — ABNORMAL LOW (ref 12.0–15.0)
Immature Granulocytes: 1 %
Lymphocytes Relative: 6 %
Lymphs Abs: 0.6 10*3/uL — ABNORMAL LOW (ref 0.7–4.0)
MCH: 23.1 pg — ABNORMAL LOW (ref 26.0–34.0)
MCHC: 31.2 g/dL (ref 30.0–36.0)
MCV: 74 fL — ABNORMAL LOW (ref 80.0–100.0)
Monocytes Absolute: 0.6 10*3/uL (ref 0.1–1.0)
Monocytes Relative: 6 %
Neutro Abs: 8.6 10*3/uL — ABNORMAL HIGH (ref 1.7–7.7)
Neutrophils Relative %: 87 %
Platelets: 251 10*3/uL (ref 150–400)
RBC: 3.77 MIL/uL — ABNORMAL LOW (ref 3.87–5.11)
RDW: 15.3 % (ref 11.5–15.5)
WBC: 9.8 10*3/uL (ref 4.0–10.5)
nRBC: 0 % (ref 0.0–0.2)

## 2023-11-25 LAB — GLUCOSE, CAPILLARY
Glucose-Capillary: 174 mg/dL — ABNORMAL HIGH (ref 70–99)
Glucose-Capillary: 233 mg/dL — ABNORMAL HIGH (ref 70–99)
Glucose-Capillary: 199 mg/dL — ABNORMAL HIGH (ref 70–99)
Glucose-Capillary: 283 mg/dL — ABNORMAL HIGH (ref 70–99)

## 2023-11-25 MED ORDER — INSULIN ASPART 100 UNIT/ML IJ SOLN
0.0000 [IU] | Freq: Every day | INTRAMUSCULAR | Status: DC
  Administered 2023-11-25: 3 [IU] via SUBCUTANEOUS
  Filled 2023-11-25: qty 1

## 2023-11-25 MED ORDER — INSULIN ASPART 100 UNIT/ML IJ SOLN
0.0000 [IU] | Freq: Three times a day (TID) | INTRAMUSCULAR | Status: DC
  Administered 2023-11-26: 5 [IU] via SUBCUTANEOUS
  Administered 2023-11-26 – 2023-11-27 (×3): 3 [IU] via SUBCUTANEOUS
  Administered 2023-11-27 (×2): 5 [IU] via SUBCUTANEOUS
  Administered 2023-11-28: 8 [IU] via SUBCUTANEOUS
  Administered 2023-11-28: 5 [IU] via SUBCUTANEOUS
  Filled 2023-11-25 (×8): qty 1

## 2023-11-25 NOTE — Progress Notes (Signed)
 Progress Note   Patient: Alisha Sanchez ZOX:[id number] DOB: Sep 05, 1955 DOA: 11/23/2023     2 DOS: the patient was seen and examined on 11/25/2023   Brief hospital course: 68yo with h/o thyroid  CA s/p RAI, HTN, DM, and lower eyelid melanoma s/p Mohs (04/2023) who presented on 5/23 with a mechanical fall resulting in a left intertrochanteric hip fracture.  ORIF on 5/24.  Assessment and Plan:  Closed intertrochanteric fracture of hip, left, initial encounter  Mechanical fall Pain control Orthopedics consulted ORIF on 5/24 PT/OT post-operatively On alendronate for osteoporosis; consider alternative therapy Needs SNF rehab   Type 2 diabetes mellitus with hyperglycemia, without long-term current use of insulin   A1c is 6.7, good control Hold glipizide, Jaumet Cover with moderate-scale SSI Carb modified diet    Chronic pain disorder I have reviewed this patient in the [place] Controlled Substances Reporting System.  She is receiving medications from only one provider and is only receiving tramadol  periodically She is not at particularly high risk of opioid misuse, diversion, or overdose.    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   Thyroid  cancer s/p radioactive iodine  2009  Continue levothyroxine  112 mcg   History of pulmonary embolism S/p IVC filter Not currently on anticoagulants   Essential hypertension Continue amlodipine  Resume hydrochlorothiazide  on 5/25   HLD Continue rosuvastatin    Hypothyroidism Continue Synthroid    Mood d/o Continue citalopram         Consultants: Orthopedics   Procedures: Hip repair 5/24   Antibiotics: Cefazolin  x 1  30 Day Unplanned Readmission Risk Score    Flowsheet Row ED to Hosp-Admission (Current) from 11/23/2023 in [facility] (1A)  30 Day Unplanned Readmission Risk Score (%) 11.69 Filed at 11/25/2023 0401       This score is the patient's risk of an  unplanned readmission within 30 days of being discharged (0 -100%). The score is based on dignosis, age, lab data, medications, orders, and past utilization.   Low:  0-14.9   Medium: 15-21.9   High: 22-29.9   Extreme: 30 and above           Subjective: Feeling ok, L hip feels "different" but generally pain controlled.  Up with therapy this AM, agrees with plan for SNF rehab.     Objective: Vitals:   11/25/23 0604 11/25/23 0822  BP: 125/72 124/64  Pulse: 73 76  Resp: 18 18  Temp: 98.5 F (36.9 C) 98.4 F (36.9 C)  SpO2: 97% 98%    Intake/Output Summary (Last 24 hours) at 11/25/2023 1144 Last data filed at 11/25/2023 8119 Gross per 24 hour  Intake 1676.77 ml  Output 1200 ml  Net 476.77 ml   Filed Weights   11/24/23 1035  Weight: 81.6 kg    Exam:  General:  Appears calm and comfortable and is in NAD, up in bedside chair Eyes:  normal lids, iris ENT:  grossly normal hearing, lips & tongue, mmm Cardiovascular:  RRR, no m/r/g. No LE edema.  Respiratory:   CTA bilaterally with no wheezes/rales/rhonchi.  Normal respiratory effort. Abdomen:  soft, NT, ND Skin:  no rash or induration seen on limited exam Musculoskeletal:  bandage on L hip is C/D/I Psychiatric:  grossly normal mood and affect, speech fluent and appropriate, A&O x 3 Neurologic:  CN 2-12 grossly intact, moves all extremities in coordinated fashion  Data Reviewed: I have reviewed the patient's lab results since admission.  Pertinent labs for today include:  Glucose 168 WBC 9.8 Hgb 8.7     Family Communication: None present  Disposition: Status is: Inpatient Remains inpatient appropriate because: s/p hip repair, needs placement     Time spent: 50 minutes  Unresulted Labs (From admission, onward)     Start     Ordered   11/26/23 0500  CBC with Differential/Platelet  Tomorrow morning,   R        11/25/23 1144   11/26/23 0500  Basic metabolic panel with GFR  Tomorrow morning,   R         11/25/23 1144             Author: Lorita Rosa, MD 11/25/2023 11:44 AM  For on call review [email or web address].

## 2023-11-25 NOTE — TOC Initial Note (Signed)
 Transition of Care [facility]) - Initial/Assessment Note    Patient Details  Name: Alisha Sanchez MRN: [id number] Date of Birth: 01/22/1956  Transition of [facility]) CM/SW Contact:    Alexandra Ice, RN Phone Number: 11/25/2023, 2:26 PM  Clinical Narrative:                  Patient lives with spouse in one-level home. Home DME: RW, rollator, single-point cane. Has PCP in the community. Patient needs short term rehab in skilled nursing facility. PASRR, FL2 and bedsearch completed. Pending bed offers       Patient Goals and CMS Choice            Expected Discharge Plan and Services                                              Prior Living Arrangements/Services                       Activities of Daily Living      Permission Sought/Granted                  Emotional Assessment              Admission diagnosis:  Closed fracture of left hip, initial encounter (HCC) [S72.002A] Closed intertrochanteric fracture of hip, left, initial encounter [facility]) [S72.142A] Patient Active Problem List   Diagnosis Date Noted   Closed intertrochanteric fracture of hip, left, initial encounter (HCC) 11/23/2023   Uncontrolled type 2 diabetes mellitus with hyperglycemia, without long-term current use of insulin  (HCC) 11/23/2023   Malignant melanoma of face (HCC) 06/25/2023   Sacral insufficiency fracture 01/11/2021   S/P IVC filter 05/13/2019   Diabetes mellitus type 2, uncomplicated (HCC) 03/24/2019   GERD (gastroesophageal reflux disease) 03/24/2019   Menorrhagia 03/24/2019   Migraines 03/24/2019   Pulmonary embolism (HCC) 03/24/2019   Thyroid  cancer s/p radioactive iodine  2009 (HCC) 03/24/2019   Total knee replacement status 03/24/2019   Primary osteoarthritis of left shoulder 11/18/2018   Rotator cuff tendinitis, left 10/30/2018   Traumatic complete tear of left rotator cuff 10/30/2018   History of pulmonary embolism 08/04/2018   Primary  osteoarthritis of left knee 05/14/2018   Chronic pain of left knee 05/14/2018   Chronic pain syndrome 05/14/2018   Chronic pain disorder 05/14/2018   Diabetes (HCC) 09/22/2009   Hyperlipidemia 09/22/2009   Essential hypertension 09/22/2009   FATIGUE / MALAISE 09/22/2009   EDEMA 09/22/2009   CHEST PAIN UNSPECIFIED 09/22/2009   PCP:  Lyle San, MD Pharmacy:   [id number] [place], [place] - [address] AT [facility] [address] [facility] [address] [place] [address] Phone: [phone] Fax: [phone]     Social Drivers of Health (SDOH) Social History: SDOH Screenings   Food Insecurity: Unknown (11/24/2023)  Housing: Unknown (11/24/2023)  Transportation Needs: Unknown (11/24/2023)  Utilities: Not At Risk (11/24/2023)  Depression (PHQ2-9): Low Risk  (04/27/2019)  Financial Resource Strain: Patient Declined (09/27/2023)   Received from [facility]  Social Connections: Unknown (11/24/2023)  Tobacco Use: Low Risk  (11/23/2023)   SDOH Interventions:     Readmission Risk Interventions     No data to display

## 2023-11-25 NOTE — Progress Notes (Signed)
 Subjective: 1 Day Post-Op Procedure(s) (LRB): FIXATION, FRACTURE, INTERTROCHANTERIC, WITH INTRAMEDULLARY ROD (Left) Patient is sitting up in bed and is alert appears comfortable.  Family is present.  Dressing is dry.  Passive motion of the hip and knee is satisfactory.  Hemoglobin is 8.7 today. Plan progressive mobilization and discharge probably to skilled nursing unless she makes impressive strides in the next 2 to 3 days. She can be switched to Eliquis for postop DVT prophylaxis due to her history of pulmonary emboli in the past and IVC catheter in place which places her in a high risk category.  Patient reports pain as mild.  Objective:   VITALS:   Vitals:   11/25/23 0604 11/25/23 0822  BP: 125/72 124/64  Pulse: 73 76  Resp: 18 18  Temp: 98.5 F (36.9 C) 98.4 F (36.9 C)  SpO2: 97% 98%    Neurologically intact Neurovascular intact Sensation intact distally Dorsiflexion/Plantar flexion intact Incision: dressing C/D/I  LABS Recent Labs    11/23/23 2206 11/24/23 1620 11/25/23 0521  HGB 10.2* 9.4* 8.7*  HCT 32.6* 30.2* 27.9*  WBC 6.2 13.0* 9.8  PLT 305 231 251    Recent Labs    11/23/23 2206 11/25/23 0521  NA 139 140  K 3.8 3.7  BUN 18 9  CREATININE 0.71 0.55  GLUCOSE 236* 168*    Recent Labs    11/23/23 2205  INR 1.0     Assessment/Plan: 1 Day Post-Op Procedure(s) (LRB): FIXATION, FRACTURE, INTERTROCHANTERIC, WITH INTRAMEDULLARY ROD (Left)   Advance diet Up with therapy Discharge to SNF unless she demonstrates an ability to go home with home therapy. Follow-up in my office 2 weeks after discharge for exam and x-rays Weightbearing as tolerated with a walker Discharge on Eliquis due to her history of pulmonary emboli and indwelling IVC filter

## 2023-11-25 NOTE — Progress Notes (Signed)
 Occupational Therapy Evaluation Patient Details Name: Alisha Sanchez MRN: [id number] DOB: Jul 05, 1955 Today's Date: 11/25/2023   History of Present Illness   Presented to fall while walking dog; admitted for management of L intertrochanteric hip fracture, s/p IM rodding (11/24/23, WBAT)     Clinical Impressions Pt seen for OT/PT evaluation this date, POD#1 from above surgery. Pt was independent in all ADLs/IADLs prior to surgery, Pt is eager to return to PLOF with less pain and improved safety and independence.Pt presents to acute OT demonstrating impaired ADL performance and functional mobility 2/2 (See OT problem list for additional functional deficits). Pt currently requires MAX assist for LB dressing while in seated position due to pain and increased pain level. Pt currently requires MODA +2 for bed mobility, STS from EOB, and transfer into recliner. Pt educated on PWB status and how to implement during ADLs and functional mobility. The pt demonstrated fair carryover of WB status during OOB mobility. Pt completed seated UB dressing with set up assistance as well as set up with oral care tasks to complete PRN. Pt retired in Medical illustrator with all needs in reach, ice applied for comfort. Pt would benefit from skilled OT services to address noted impairments and functional limitations (see below for any additional details) in order to maximize safety and independence while minimizing falls risk and caregiver burden. OT will follow acutely.      If plan is discharge home, recommend the following:   A lot of help with bathing/dressing/bathroom;A lot of help with walking and/or transfers;Assistance with cooking/housework;Assist for transportation;Help with stairs or ramp for entrance     Functional Status Assessment   Patient has had a recent decline in their functional status and demonstrates the ability to make significant improvements in function in a reasonable and predictable amount of  time.     Equipment Recommendations   Other (comment) (Defer to next venue of care)     Recommendations for Other Services         Precautions/Restrictions   Precautions Precautions: Fall Recall of Precautions/Restrictions: Intact Restrictions Weight Bearing Restrictions Per Provider Order: Yes LLE Weight Bearing Per Provider Order: Partial weight bearing     Mobility Bed Mobility Overal bed mobility: Needs Assistance Bed Mobility: Supine to Sit     Supine to sit: Mod assist, +2 for physical assistance          Transfers Overall transfer level: Needs assistance Equipment used: Rolling walker (2 wheels) Transfers: Sit to/from Stand, Bed to chair/wheelchair/BSC Sit to Stand: Mod assist, +2 physical assistance           General transfer comment: Frequent verbal cues for off loading LLE with RW and for adherence to [facility] precautions      Balance Overall balance assessment: Needs assistance Sitting-balance support: No upper extremity supported, Feet supported Sitting balance-Leahy Scale: Good Sitting balance - Comments: Steady reaching within BOS   Standing balance support: Bilateral upper extremity supported Standing balance-Leahy Scale: Fair Standing balance comment: Heavy reliant on RW                           ADL either performed or assessed with clinical judgement   ADL Overall ADL's : Needs assistance/impaired Eating/Feeding: Set up               Upper Body Dressing : Set up;Sitting   Lower Body Dressing: Maximal assistance Lower Body Dressing Details (indicate cue type and reason): Adjusting bilateral socks, attempted  but unable to complete due pain Toilet Transfer: Ambulation;Rolling walker (2 wheels);Moderate assistance;+2 for safety/equipment Toilet Transfer Details (indicate cue type and reason): Simulated toilet t/f into recliner from EOB         Functional mobility during ADLs: Moderate assistance;+2 for  safety/equipment;Rolling walker (2 wheels) General ADL Comments: Set up for seated grooming tasks, simulated toilet tranfsers into recliner      Pertinent Vitals/Pain Pain Assessment Pain Assessment: 0-10 Pain Score: 5  Pain Location: L hip Pain Descriptors / Indicators: Aching, Grimacing, Guarding Pain Intervention(s): Limited activity within patient's tolerance, Repositioned, Ice applied, Premedicated before session     Extremity/Trunk Assessment Upper Extremity Assessment Upper Extremity Assessment: Overall WFL for tasks assessed   Lower Extremity Assessment Lower Extremity Assessment: Generalized weakness;Defer to PT evaluation   Cervical / Trunk Assessment Cervical / Trunk Assessment: Normal   Communication Communication Communication: No apparent difficulties   Cognition Arousal: Alert Behavior During Therapy: WFL for tasks assessed/performed, Anxious Cognition: No apparent impairments             OT - Cognition Comments: A/Ox4                 Following commands: Intact       Cueing  General Comments   Cueing Techniques: Verbal cues  Post op dressing D/C/I pre/post session   Exercises Exercises: Other exercises Other Exercises Other Exercises: Edu: Role of OT/PT eval, hand placement - bed mobility/DME management, DC planning   Shoulder Instructions      Home Living Family/patient expects to be discharged to:: Private residence Living Arrangements: Spouse/significant other Available Help at Discharge: Family Type of Home: House       Home Layout: Two level;Able to live on main level with bedroom/bathroom     Bathroom Shower/Tub: Tub/shower unit         Home Equipment: Agricultural consultant (2 wheels);Rollator (4 wheels);Cane - single point          Prior Functioning/Environment Prior Level of Function : Independent/Modified Independent             Mobility Comments: Indep with ADLs, household and community mobilization without  assist device; denies additional fall history      OT Problem List: Decreased strength;Decreased activity tolerance;Impaired balance (sitting and/or standing);Decreased safety awareness;Decreased knowledge of use of DME or AE   OT Treatment/Interventions: Energy conservation;DME and/or AE instruction;Therapeutic activities;Balance training;Patient/family education;Self-care/ADL training;Therapeutic exercise      OT Goals(Current goals can be found in the care plan section)   Acute Rehab OT Goals Patient Stated Goal: Feel better OT Goal Formulation: With patient Time For Goal Achievement: 12/09/23 Potential to Achieve Goals: Good ADL Goals Pt Will Perform Grooming: with supervision;standing Pt Will Perform Upper Body Dressing: with supervision Pt Will Perform Lower Body Dressing: with supervision;sit to/from stand Pt Will Transfer to Toilet: ambulating;regular height toilet   OT Frequency:  Min 3X/week    Co-evaluation PT/OT/SLP Co-Evaluation/Treatment: Yes Reason for Co-Treatment: For patient/therapist safety;To address functional/ADL transfers   OT goals addressed during session: ADL's and self-care;Proper use of Adaptive equipment and DME      AM-PAC OT "6 Clicks" Daily Activity     Outcome Measure Help from another person eating meals?: A Little Help from another person taking care of personal grooming?: A Little Help from another person toileting, which includes using toliet, bedpan, or urinal?: A Lot Help from another person bathing (including washing, rinsing, drying)?: A Lot Help from another person to put on and taking off  regular upper body clothing?: A Little Help from another person to put on and taking off regular lower body clothing?: A Lot 6 Click Score: 15   End of Session Equipment Utilized During Treatment: Gait belt;Rolling walker (2 wheels) Nurse Communication: Mobility status  Activity Tolerance: Patient tolerated treatment well Patient left: in  chair;with call bell/phone within reach;with chair alarm set  OT Visit Diagnosis: Unsteadiness on feet (R26.81);Other abnormalities of gait and mobility (R26.89)                Time: 9528-4132 OT Time Calculation (min): 23 min Charges:  OT General Charges $OT Visit: 1 Visit OT Evaluation $OT Eval Moderate Complexity: 1 Mod  Jaycey Gens M.S. OTR/L  11/25/23, 9:53 AM

## 2023-11-25 NOTE — Evaluation (Signed)
 Physical Therapy Evaluation Patient Details Name: Alisha Sanchez MRN: [id number] DOB: May 18, 1956 Today's Date: 11/25/2023  History of Present Illness  presented to fall while walking dog; admitted for management of L intertrochanteric hip fracture, s/p IM rodding (11/24/23, WBAT)  Clinical Impression  Patient resting in bed upon arrival to session; alert and oriented, follows commands and agreeable to participation with session.  Slightly anxious re: mobility and pain response, but responds well to encouragement and reassurance.  Rates L hip pain 5/10; meds received prior to session.  Demonstrates fair post-op strength (at least 3-/5) and ROM to L hip; generally limited at end-ranges due to post-op pain/soreness. Currently requiring mod assist +2 for bed mobility, sit/stand, and bed/chair with RW.   Requires cuing for hand placement, L LE WBing precautions; very slow and hesitant in transition, heavily reliant on UE support to complete.  Fair advancement of L LE with SPT; fair adherence to [facility] with standing and transfer efforts.  Additional gait deferred this date due to pain; do anticipate consistent progression towards goals as pain controlled. Would benefit from skilled PT to address above deficits and promote optimal return to PLOF.; recommend post-acute PT follow up as indicated by interdisciplinary care team.          If plan is discharge home, recommend the following: Two people to help with walking and/or transfers;Two people to help with bathing/dressing/bathroom   Can travel by private vehicle        Equipment Recommendations BSC/3in1  Recommendations for Other Services       Functional Status Assessment Patient has had a recent decline in their functional status and demonstrates the ability to make significant improvements in function in a reasonable and predictable amount of time.     Precautions / Restrictions Precautions Precautions: Fall Restrictions Weight Bearing  Restrictions Per Provider Order: Yes LLE Weight Bearing Per Provider Order: Partial weight bearing      Mobility  Bed Mobility Overal bed mobility: Needs Assistance Bed Mobility: Supine to Sit     Supine to sit: Mod assist, +2 for physical assistance          Transfers Overall transfer level: Needs assistance Equipment used: Rolling walker (2 wheels) Transfers: Sit to/from Stand Sit to Stand: Mod assist, +2 physical assistance           General transfer comment: cuing for hand placement, L LE WBing precautions; very slow and hesitant in transition, heavily reliant on UE support to complete    Ambulation/Gait               General Gait Details: unable to tolerate due to pain  Stairs            Wheelchair Mobility     Tilt Bed    Modified Rankin (Stroke Patients Only)       Balance Overall balance assessment: Needs assistance Sitting-balance support: No upper extremity supported, Feet supported Sitting balance-Leahy Scale: Good     Standing balance support: Bilateral upper extremity supported Standing balance-Leahy Scale: Fair                               Pertinent Vitals/Pain Pain Assessment Pain Assessment: 0-10 Pain Score: 5  Pain Location: L hip Pain Descriptors / Indicators: Aching, Grimacing, Guarding Pain Intervention(s): Limited activity within patient's tolerance, Monitored during session, Repositioned, Premedicated before session    Home Living Family/patient expects to be discharged to:: Private residence Living  Arrangements: Spouse/significant other (sister also lives upstairs) Available Help at Discharge: Family Type of Home: House         Home Layout: Two level;Able to live on main level with bedroom/bathroom Home Equipment: Rolling Walker (2 wheels);Rollator (4 wheels);Cane - single point      Prior Function Prior Level of Function : Independent/Modified Independent             Mobility Comments:  Indep with ADLs, household and community mobilization without assist device; denies additional fall history       Extremity/Trunk Assessment   Upper Extremity Assessment Upper Extremity Assessment: Overall WFL for tasks assessed    Lower Extremity Assessment Lower Extremity Assessment: Generalized weakness (L LE grossly 3-/5, limited by pain)       Communication        Cognition Arousal: Alert Behavior During Therapy: WFL for tasks assessed/performed, Anxious   PT - Cognitive impairments: No apparent impairments                                 Cueing       General Comments      Exercises Other Exercises Other Exercises: Supine L LE therex, 1x10, act assist ROM: ankle pumps, heel slides, hip abduct/adduct within tolerable range   Assessment/Plan    PT Assessment Patient needs continued PT services  PT Problem List Decreased strength;Decreased range of motion;Decreased activity tolerance;Decreased safety awareness;Decreased mobility;Decreased balance;Decreased knowledge of use of DME;Decreased knowledge of precautions;Pain       PT Treatment Interventions DME instruction;Gait training;Stair training;Functional mobility training;Therapeutic activities;Therapeutic exercise;Balance training;Patient/family education    PT Goals (Current goals can be found in the Care Plan section)  Acute Rehab PT Goals Patient Stated Goal: to get this started PT Goal Formulation: With patient Time For Goal Achievement: 12/09/23 Potential to Achieve Goals: Good    Frequency 7X/week (may benefit from BID if progresses towards home)     Co-evaluation               AM-PAC PT "6 Clicks" Mobility  Outcome Measure Help needed turning from your back to your side while in a flat bed without using bedrails?: A Lot Help needed moving from lying on your back to sitting on the side of a flat bed without using bedrails?: A Lot Help needed moving to and from a bed to a chair  (including a wheelchair)?: A Lot Help needed standing up from a chair using your arms (e.g., wheelchair or bedside chair)?: A Lot Help needed to walk in hospital room?: Total Help needed climbing 3-5 steps with a railing? : Total 6 Click Score: 10    End of Session   Activity Tolerance: Patient tolerated treatment well Patient left: in chair;with call bell/phone within reach;with chair alarm set Nurse Communication: Mobility status PT Visit Diagnosis: Muscle weakness (generalized) (M62.81);Other abnormalities of gait and mobility (R26.89);Pain Pain - Right/Left: Left Pain - part of body: Hip    Time: 1610-9604 PT Time Calculation (min) (ACUTE ONLY): 23 min   Charges:   PT Evaluation $PT Eval Moderate Complexity: 1 Mod   PT General Charges $$ ACUTE PT VISIT: 1 Visit       Bernhard Koskinen H. Bevin Bucks, PT, DPT, NCS 11/25/23, 9:38 AM [phone]

## 2023-11-25 NOTE — NC FL2 (Signed)
 [place]  MEDICAID FL2 LEVEL OF CARE FORM     IDENTIFICATION  Patient Name: Alisha Sanchez Birthdate: 1956/05/02 Sex: female Admission Date (Current Location): 11/23/2023  [place] and [place] Number:  Chiropodist and Address:  [facility], [address] [place], [place] [address]      Provider Number: [id number]  Attending Physician Name and Address:  Lorita Rosa, MD  Relative Name and Phone Number:  Spouse: Nayzeth Altman, [phone]    Current Level of Care: Hospital Recommended Level of Care: Skilled Nursing Facility Prior Approval Number:    Date Approved/Denied:   PASRR Number: [id number]  Discharge Plan: SNF    Current Diagnoses: Patient Active Problem List   Diagnosis Date Noted   Closed intertrochanteric fracture of hip, left, initial encounter (HCC) 11/23/2023   Uncontrolled type 2 diabetes mellitus with hyperglycemia, without long-term current use of insulin  (HCC) 11/23/2023   Malignant melanoma of face (HCC) 06/25/2023   Sacral insufficiency fracture 01/11/2021   S/P IVC filter 05/13/2019   Diabetes mellitus type 2, uncomplicated (HCC) 03/24/2019   GERD (gastroesophageal reflux disease) 03/24/2019   Menorrhagia 03/24/2019   Migraines 03/24/2019   Pulmonary embolism (HCC) 03/24/2019   Thyroid  cancer s/p radioactive iodine  2009 (HCC) 03/24/2019   Total knee replacement status 03/24/2019   Primary osteoarthritis of left shoulder 11/18/2018   Rotator cuff tendinitis, left 10/30/2018   Traumatic complete tear of left rotator cuff 10/30/2018   History of pulmonary embolism 08/04/2018   Primary osteoarthritis of left knee 05/14/2018   Chronic pain of left knee 05/14/2018   Chronic pain syndrome 05/14/2018   Chronic pain disorder 05/14/2018   Diabetes (HCC) 09/22/2009   Hyperlipidemia 09/22/2009   Essential hypertension 09/22/2009   FATIGUE / MALAISE 09/22/2009   EDEMA 09/22/2009   CHEST PAIN UNSPECIFIED  09/22/2009    Orientation RESPIRATION BLADDER Height & Weight     Situation, Place, Time, Self  Normal (Carb-modified, thin liquid) Continent Weight: 81.6 kg Height:     BEHAVIORAL SYMPTOMS/MOOD NEUROLOGICAL BOWEL NUTRITION STATUS      Continent Diet (Carb-modified, thin liquid)  AMBULATORY STATUS COMMUNICATION OF NEEDS Skin   Limited Assist Verbally Surgical wounds                       Personal Care Assistance Level of Assistance  Bathing, Feeding, Dressing Bathing Assistance: Limited assistance Feeding assistance: Independent Dressing Assistance: Limited assistance     Functional Limitations Info             SPECIAL CARE FACTORS FREQUENCY  PT (By licensed PT), OT (By licensed OT)     PT Frequency: 5 times per week OT Frequency: 5 times per week            Contractures Contractures Info: Not present    Additional Factors Info  Code Status, Allergies Code Status Info: Full Code Allergies Info: NKDA           Current Medications (11/25/2023):  This is the current hospital active medication list Current Facility-Administered Medications  Medication Dose Route Frequency Provider Last Rate Last Admin   0.45 % sodium chloride  infusion   Intravenous Continuous Marlynn Singer, MD   Stopped at 11/25/23 0225   acetaminophen  (TYLENOL ) tablet 325-650 mg  325-650 mg Oral Q6H PRN Marlynn Singer, MD       alum & mag hydroxide-simeth (MAALOX/MYLANTA) 200-200-20 MG/5ML suspension 30 mL  30 mL Oral Q4H PRN Marlynn Singer, MD  amLODipine  (NORVASC ) tablet 5 mg  5 mg Oral QHS Marlynn Singer, MD   5 mg at 11/24/23 2200   bisacodyl  (DULCOLAX) suppository 10 mg  10 mg Rectal Daily PRN Marlynn Singer, MD       Chlorhexidine  Gluconate Cloth 2 % PADS 6 each  6 each Topical Daily Lorita Rosa, MD   6 each at 11/25/23 1100   citalopram (CELEXA) tablet 10 mg  10 mg Oral Daily Marlynn Singer, MD   10 mg at 11/25/23 0843   enoxaparin  (LOVENOX ) injection 30 mg  30 mg  Subcutaneous Q24H Marlynn Singer, MD   30 mg at 11/25/23 0843   feeding supplement (ENSURE ENLIVE / ENSURE PLUS) liquid 237 mL  237 mL Oral BID BM Lorita Rosa, MD   237 mL at 11/25/23 1422   fentaNYL  (SUBLIMAZE ) injection 25-50 mcg  25-50 mcg Intravenous Q5 min PRN Marlynn Singer, MD   25 mcg at 11/24/23 1341   ferrous sulfate  tablet 325 mg  325 mg Oral Q breakfast Marlynn Singer, MD   325 mg at 11/25/23 9562   gabapentin  (NEURONTIN ) capsule 200 mg  200 mg Oral BID Marlynn Singer, MD   200 mg at 11/25/23 1308   hydrochlorothiazide  (HYDRODIURIL ) tablet 25 mg  25 mg Oral Daily Lorita Rosa, MD   25 mg at 11/25/23 6578   HYDROcodone -acetaminophen  (NORCO/VICODIN) 5-325 MG per tablet 1-2 tablet  1-2 tablet Oral Q4H PRN Marlynn Singer, MD   2 tablet at 11/25/23 1243   insulin  aspart (novoLOG ) injection 0-15 Units  0-15 Units Subcutaneous TID [facility] Lorita Rosa, MD   5 Units at 11/25/23 1222   levothyroxine  (SYNTHROID ) tablet 112 mcg  112 mcg Oral Q0600 Marlynn Singer, MD   112 mcg at 11/25/23 0656   magnesium  hydroxide (MILK OF MAGNESIA) suspension 30 mL  30 mL Oral Daily PRN Marlynn Singer, MD       melatonin tablet 10 mg  10 mg Oral QHS PRN Marlynn Singer, MD       menthol -cetylpyridinium (CEPACOL) lozenge 3 mg  1 lozenge Oral PRN Marlynn Singer, MD       Or   phenol (CHLORASEPTIC) mouth spray 1 spray  1 spray Mouth/Throat PRN Marlynn Singer, MD       metoCLOPramide  (REGLAN ) tablet 5-10 mg  5-10 mg Oral Q8H PRN Marlynn Singer, MD       Or   metoCLOPramide  (REGLAN ) injection 5-10 mg  5-10 mg Intravenous Q8H PRN Marlynn Singer, MD       morphine  (PF) 2 MG/ML injection 0.5-1 mg  0.5-1 mg Intravenous Q2H PRN Marlynn Singer, MD       ondansetron  (ZOFRAN ) tablet 4 mg  4 mg Oral Q6H PRN Marlynn Singer, MD       Or   ondansetron  (ZOFRAN ) injection 4 mg  4 mg Intravenous Q6H PRN Marlynn Singer, MD       oxybutynin  (DITROPAN -XL) 24 hr tablet 10 mg  10 mg Oral QHS Marlynn Singer, MD   10 mg at 11/24/23  2200   pantoprazole  (PROTONIX ) EC tablet 40 mg  40 mg Oral Daily Marlynn Singer, MD   40 mg at 11/25/23 4696   rosuvastatin  (CRESTOR ) tablet 10 mg  10 mg Oral QHS Miller, Howard, MD   10 mg at 11/24/23 2200   senna (SENOKOT) tablet 8.6 mg  1 tablet Oral BID Marlynn Singer, MD   8.6 mg at 11/25/23 2952   sodium phosphate  (FLEET) enema 1 enema  1 enema Rectal Once PRN Annabell Key,  Gwen Lek, MD       zolpidem Lourdes Roy) tablet 5 mg  5 mg Oral QHS PRN Marlynn Singer, MD         Discharge Medications: Please see discharge summary for a list of discharge medications.  Relevant Imaging Results:  Relevant Lab Results:   Additional Information SSN: [id number]  Alexandra Ice, RN

## 2023-11-26 DIAGNOSIS — S72142A Displaced intertrochanteric fracture of left femur, initial encounter for closed fracture: Secondary | ICD-10-CM | POA: Diagnosis not present

## 2023-11-26 LAB — CBC WITH DIFFERENTIAL/PLATELET
Abs Immature Granulocytes: 0.04 10*3/uL (ref 0.00–0.07)
Basophils Absolute: 0 10*3/uL (ref 0.0–0.1)
Basophils Relative: 1 %
Eosinophils Absolute: 0.1 10*3/uL (ref 0.0–0.5)
Eosinophils Relative: 1 %
HCT: 26 % — ABNORMAL LOW (ref 36.0–46.0)
Hemoglobin: 8.1 g/dL — ABNORMAL LOW (ref 12.0–15.0)
Immature Granulocytes: 1 %
Lymphocytes Relative: 19 %
Lymphs Abs: 1.4 10*3/uL (ref 0.7–4.0)
MCH: 23.3 pg — ABNORMAL LOW (ref 26.0–34.0)
MCHC: 31.2 g/dL (ref 30.0–36.0)
MCV: 74.9 fL — ABNORMAL LOW (ref 80.0–100.0)
Monocytes Absolute: 0.6 10*3/uL (ref 0.1–1.0)
Monocytes Relative: 8 %
Neutro Abs: 5.2 10*3/uL (ref 1.7–7.7)
Neutrophils Relative %: 70 %
Platelets: 255 10*3/uL (ref 150–400)
RBC: 3.47 MIL/uL — ABNORMAL LOW (ref 3.87–5.11)
RDW: 15.7 % — ABNORMAL HIGH (ref 11.5–15.5)
WBC: 7.3 10*3/uL (ref 4.0–10.5)
nRBC: 0 % (ref 0.0–0.2)

## 2023-11-26 LAB — GLUCOSE, CAPILLARY
Glucose-Capillary: 172 mg/dL — ABNORMAL HIGH (ref 70–99)
Glucose-Capillary: 212 mg/dL — ABNORMAL HIGH (ref 70–99)
Glucose-Capillary: 188 mg/dL — ABNORMAL HIGH (ref 70–99)
Glucose-Capillary: 192 mg/dL — ABNORMAL HIGH (ref 70–99)

## 2023-11-26 LAB — BASIC METABOLIC PANEL WITH GFR
Anion gap: 9 (ref 5–15)
BUN: 12 mg/dL (ref 8–23)
CO2: 28 mmol/L (ref 22–32)
Calcium: 8.4 mg/dL — ABNORMAL LOW (ref 8.9–10.3)
Chloride: 100 mmol/L (ref 98–111)
Creatinine, Ser: 0.56 mg/dL (ref 0.44–1.00)
GFR, Estimated: >60 mL/min (ref >60)
Glucose, Bld: 130 mg/dL — ABNORMAL HIGH (ref 70–99)
Potassium: 3.7 mmol/L (ref 3.5–5.1)
Sodium: 137 mmol/L (ref 135–145)

## 2023-11-26 NOTE — Progress Notes (Signed)
 Progress Note   Patient: Alisha Sanchez BJY:[id number] DOB: 02-Apr-1956 DOA: 11/23/2023     3 DOS: the patient was seen and examined on 11/26/2023   Brief hospital course: 68yo with h/o thyroid  CA s/p RAI, HTN, DM, and lower eyelid melanoma s/p Mohs (04/2023) who presented on 5/23 with a mechanical fall resulting in a left intertrochanteric hip fracture.  ORIF on 5/24.  Assessment and Plan:  Closed intertrochanteric fracture of hip, left, initial encounter  Mechanical fall Pain control Orthopedics consulted ORIF on 5/24 PT/OT post-operatively On alendronate for osteoporosis; consider alternative therapy Needs SNF rehab  ABLA on chronic anemia Mild microcytic anemia on presentation Worsened, likely due to ABLA Transfuse for Hgb <7 Continue to monitor CBC daily until stable   Type 2 diabetes mellitus with hyperglycemia, without long-term current use of insulin   A1c is 6.7, good control Hold glipizide, Jaumet Cover with moderate-scale SSI Carb modified diet    Chronic pain disorder I have reviewed this patient in the [place] Controlled Substances Reporting System.  She is receiving medications from only one provider and is only receiving tramadol  periodically She is not at particularly high risk of opioid misuse, diversion, or overdose.    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   Thyroid  cancer s/p radioactive iodine  2009  Continue levothyroxine  112 mcg   History of pulmonary embolism S/p IVC filter Not currently on anticoagulants   Essential hypertension Continue amlodipine  Resume hydrochlorothiazide  on 5/25   HLD Continue rosuvastatin    Hypothyroidism Continue Synthroid    Mood d/o Continue citalopram         Consultants: Orthopedics   Procedures: Hip repair 5/24   Antibiotics: Cefazolin  x 1   30 Day Unplanned Readmission Risk Score    Flowsheet Row ED to Hosp-Admission (Current) from 11/23/2023 in [facility] (1A)  30 Day Unplanned Readmission Risk Score (%) 17.9 Filed at 11/26/2023 0401       This score is the patient's risk of an unplanned readmission within 30 days of being discharged (0 -100%). The score is based on dignosis, age, lab data, medications, orders, and past utilization.   Low:  0-14.9   Medium: 15-21.9   High: 22-29.9   Extreme: 30 and above           Subjective: Doing well, eager to go to rehab when possible.  No current concerns.  Reports that so far this is harder than either of her previous knee replacements or shoulder surgeries.   Objective: Vitals:   11/26/23 0406 11/26/23 0748  BP: 109/62 (!) 102/57  Pulse: 81 77  Resp: 16 15  Temp: 98.4 F (36.9 C) 98.3 F (36.8 C)  SpO2: 94% 96%    Intake/Output Summary (Last 24 hours) at 11/26/2023 1337 Last data filed at 11/26/2023 1044 Gross per 24 hour  Intake 290 ml  Output 1450 ml  Net -1160 ml   Filed Weights   11/24/23 1035  Weight: 81.6 kg    Exam:  General:  Appears calm and comfortable and is in NAD Eyes:  normal lids, iris ENT:  grossly normal hearing, lips & tongue, mmm Cardiovascular:  RRR, no m/r/g. No LE edema.  Respiratory:   CTA bilaterally with no wheezes/rales/rhonchi.  Normal respiratory effort. Abdomen:  soft, NT, ND Skin:  no rash or induration seen on limited exam Musculoskeletal:  bandage on L hip is C/D/I Psychiatric:  grossly normal mood and affect, speech fluent and appropriate, A&O x  3 Neurologic:  CN 2-12 grossly intact, moves all extremities in coordinated fashion  Data Reviewed: I have reviewed the patient's lab results since admission.  Pertinent labs for today include:   Glucose 130 WBC 7.3 Hgb 8.1, down from 10.2 on presentation     Family Communication: None present  Disposition: Status is: Inpatient Remains inpatient appropriate because: awaiting placement     Time spent: 50 minutes  Unresulted Labs (From admission, onward)      Start     Ordered   11/27/23 0500  CBC with Differential/Platelet  Tomorrow morning,   R        11/26/23 0736   11/27/23 0500  Basic metabolic panel with GFR  Tomorrow morning,   R        11/26/23 1610             Author: Lorita Rosa, MD 11/26/2023 1:37 PM  For on call review [email or web address].

## 2023-11-26 NOTE — Progress Notes (Signed)
 Physical Therapy Treatment Patient Details Name: Alisha Sanchez MRN: [id number] DOB: 06/27/1956 Today's Date: 11/26/2023   History of Present Illness Presented to fall while walking dog; admitted for management of L intertrochanteric hip fracture, s/p IM rodding (11/24/23, WBAT)    PT Comments  Pt was sitting in recliner upon arrival. She is A and O x 4 and pleasant/cooperative throughout. She was pre-medicated prior to session for pain however pain continues to limit session progression. Pt tolerated standing from recliner> RW and ambulating ~ 12 ft with slow antalgic step to gait pattern. No LOB but does require Vcs for adhering to PWB (50%). Once back seated in recliner, Medical sales representative HEP with instructions. Pt remains far form her baseline abilities. DC recs remain appropriate. She will continue to benefit from skilled PT at DC to maximize independence and safety with all ADLs.    If plan is discharge home, recommend the following: A lot of help with walking and/or transfers;A lot of help with bathing/dressing/bathroom;Assistance with cooking/housework;Assist for transportation;Help with stairs or ramp for entrance     Equipment Recommendations  BSC/3in1       Precautions / Restrictions Precautions Precautions: Fall Recall of Precautions/Restrictions: Intact Restrictions Weight Bearing Restrictions Per Provider Order: Yes LLE Weight Bearing Per Provider Order: Partial weight bearing LLE Partial Weight Bearing Percentage or Pounds: 50%     Mobility  Bed Mobility  General bed mobility comments: Pt was in recliner pre/post session    Transfers Overall transfer level: Needs assistance Equipment used: Rolling walker (2 wheels) Transfers: Sit to/from Stand Sit to Stand: Min assist, Mod assist  General transfer comment: pt was able to stand form recliner with less overall assistance. Vcs for handplacement, and technique improvements. Pt does well adhering to [facility] with cueing     Ambulation/Gait Ambulation/Gait assistance: Contact guard assist, Min assist Gait Distance (Feet): 12 Feet Assistive device: Rolling walker (2 wheels) Gait Pattern/deviations: Step-to pattern, Antalgic Gait velocity: decreased  General Gait Details: Pt was able to ambulate ~ 12 ft with RW. antalgic step to gait with vcs for increased UE wt bearing to maintain PWB restrictions   Balance Overall balance assessment: Needs assistance Sitting-balance support: No upper extremity supported, Feet supported Sitting balance-Leahy Scale: Good     Standing balance support: Bilateral upper extremity supported, During functional activity, Reliant on assistive device for balance Standing balance-Leahy Scale: Fair Standing balance comment: Heavy reliant on RW     Communication Communication Communication: No apparent difficulties  Cognition Arousal: Alert Behavior During Therapy: WFL for tasks assessed/performed, Anxious   PT - Cognitive impairments: No apparent impairments   PT - Cognition Comments: Pt is A and O x 4 Following commands: Intact      Cueing Cueing Techniques: Verbal cues, Tactile cues     General Comments General comments (skin integrity, edema, etc.): Chartered loss adjuster issued HEP handout with cueing on performance.      Pertinent Vitals/Pain Pain Assessment Pain Assessment: 0-10 Pain Score: 7  Pain Location: L hip Pain Descriptors / Indicators: Aching, Grimacing, Guarding Pain Intervention(s): Limited activity within patient's tolerance, Monitored during session, Premedicated before session, Repositioned, Ice applied     PT Goals (current goals can now be found in the care plan section) Acute Rehab PT Goals Patient Stated Goal: rehab then home Progress towards PT goals: Progressing toward goals    Frequency    7X/week       AM-PAC PT "6 Clicks" Mobility   Outcome Measure  Help needed turning from your  back to your side while in a flat bed without using  bedrails?: A Little Help needed moving from lying on your back to sitting on the side of a flat bed without using bedrails?: A Lot Help needed moving to and from a bed to a chair (including a wheelchair)?: A Lot Help needed standing up from a chair using your arms (e.g., wheelchair or bedside chair)?: A Lot Help needed to walk in hospital room?: A Lot Help needed climbing 3-5 steps with a railing? : A Lot 6 Click Score: 13    End of Session   Activity Tolerance: Patient tolerated treatment well;Patient limited by pain Patient left: in chair;with call bell/phone within reach;with chair alarm set Nurse Communication: Mobility status PT Visit Diagnosis: Muscle weakness (generalized) (M62.81);Other abnormalities of gait and mobility (R26.89);Pain Pain - Right/Left: Left Pain - part of body: Hip     Time: 1020-1043 PT Time Calculation (min) (ACUTE ONLY): 23 min  Charges:    $Gait Training: 8-22 mins $Therapeutic Exercise: 8-22 mins PT General Charges $$ ACUTE PT VISIT: 1 Visit                     Chester Costa PTA 11/26/23, 10:59 AM

## 2023-11-26 NOTE — TOC Progression Note (Addendum)
 Transition of Care [facility]) - Progression Note    Patient Details  Name: Alisha Sanchez MRN: [id number] Date of Birth: 04-Dec-1955  [facility] CM/SW Contact  Crayton Docker, RN 11/26/2023, 10:06 AM  Clinical Narrative:     Noted, three accepting SNF offers, [facility], [facility] and [facility] . CM call to patient's phone: [phone], regarding SNF preferences. No answer, CM left message for return call.    CM to patient's room regarding SNF preferences. Per patient, prefers [facility]. CM secure message to Antony Baumgartner, Admissions, [employer]. Patient will require auth and BLS transport.  Expected Discharge Plan and Services    SNF     Social Determinants of Health (SDOH) Interventions SDOH Screenings   Food Insecurity: Unknown (11/24/2023)  Housing: Unknown (11/24/2023)  Transportation Needs: Unknown (11/24/2023)  Utilities: Not At Risk (11/24/2023)  Depression (PHQ2-9): Low Risk  (04/27/2019)  Financial Resource Strain: Patient Declined (09/27/2023)   Received from [facility]  Social Connections: Unknown (11/24/2023)  Tobacco Use: Low Risk  (11/23/2023)    Readmission Risk Interventions     No data to display

## 2023-11-26 NOTE — Progress Notes (Signed)
 Subjective: 2 Days Post-Op Procedure(s) (LRB): FIXATION, FRACTURE, INTERTROCHANTERIC, WITH INTRAMEDULLARY ROD (Left) Patient is out of bed in a chair.  She is alert and oriented.  Pain is moderate.  She walked a few steps. Hemoglobin is stable at 8.1. She will need skilled nursing placement most likely.  Patient reports pain as mild.  Objective:   VITALS:   Vitals:   11/26/23 0406 11/26/23 0748  BP: 109/62 (!) 102/57  Pulse: 81 77  Resp: 16 15  Temp: 98.4 F (36.9 C) 98.3 F (36.8 C)  SpO2: 94% 96%    Neurologically intact Sensation intact distally Dorsiflexion/Plantar flexion intact Incision: dressing C/D/I  LABS Recent Labs    11/24/23 1620 11/25/23 0521 11/26/23 0329  HGB 9.4* 8.7* 8.1*  HCT 30.2* 27.9* 26.0*  WBC 13.0* 9.8 7.3  PLT 231 251 255    Recent Labs    11/23/23 2206 11/25/23 0521 11/26/23 0329  NA 139 140 137  K 3.8 3.7 3.7  BUN 18 9 12   CREATININE 0.71 0.55 0.56  GLUCOSE 236* 168* 130*    Recent Labs    11/23/23 2205  INR 1.0     Assessment/Plan: 2 Days Post-Op Procedure(s) (LRB): FIXATION, FRACTURE, INTERTROCHANTERIC, WITH INTRAMEDULLARY ROD (Left)   Advance diet Up with therapy Discharge to SNF when available. Discharge on 81 mg ASA twice daily for 6 to 8 weeks Return to my office 2 weeks after discharge

## 2023-11-26 NOTE — Plan of Care (Signed)
   Problem: Activity: Goal: Risk for activity intolerance will decrease Outcome: Progressing   Problem: Nutrition: Goal: Adequate nutrition will be maintained Outcome: Progressing   Problem: Elimination: Goal: Will not experience complications related to bowel motility Outcome: Progressing Goal: Will not experience complications related to urinary retention Outcome: Progressing

## 2023-11-27 ENCOUNTER — Encounter: Payer: Self-pay | Admitting: Specialist

## 2023-11-27 DIAGNOSIS — S72142A Displaced intertrochanteric fracture of left femur, initial encounter for closed fracture: Secondary | ICD-10-CM | POA: Diagnosis not present

## 2023-11-27 LAB — BASIC METABOLIC PANEL WITH GFR
Anion gap: 11 (ref 5–15)
BUN: 15 mg/dL (ref 8–23)
CO2: 29 mmol/L (ref 22–32)
Calcium: 8.6 mg/dL — ABNORMAL LOW (ref 8.9–10.3)
Chloride: 101 mmol/L (ref 98–111)
Creatinine, Ser: 0.56 mg/dL (ref 0.44–1.00)
GFR, Estimated: >60 mL/min (ref >60)
Glucose, Bld: 159 mg/dL — ABNORMAL HIGH (ref 70–99)
Potassium: 3.4 mmol/L — ABNORMAL LOW (ref 3.5–5.1)
Sodium: 141 mmol/L (ref 135–145)

## 2023-11-27 LAB — CBC WITH DIFFERENTIAL/PLATELET
Abs Immature Granulocytes: 0.04 10*3/uL (ref 0.00–0.07)
Basophils Absolute: 0 10*3/uL (ref 0.0–0.1)
Basophils Relative: 1 %
Eosinophils Absolute: 0.1 10*3/uL (ref 0.0–0.5)
Eosinophils Relative: 2 %
HCT: 27.6 % — ABNORMAL LOW (ref 36.0–46.0)
Hemoglobin: 8.3 g/dL — ABNORMAL LOW (ref 12.0–15.0)
Immature Granulocytes: 1 %
Lymphocytes Relative: 22 %
Lymphs Abs: 1.5 10*3/uL (ref 0.7–4.0)
MCH: 22.6 pg — ABNORMAL LOW (ref 26.0–34.0)
MCHC: 30.1 g/dL (ref 30.0–36.0)
MCV: 75.2 fL — ABNORMAL LOW (ref 80.0–100.0)
Monocytes Absolute: 0.6 10*3/uL (ref 0.1–1.0)
Monocytes Relative: 9 %
Neutro Abs: 4.4 10*3/uL (ref 1.7–7.7)
Neutrophils Relative %: 65 %
Platelets: 274 10*3/uL (ref 150–400)
RBC: 3.67 MIL/uL — ABNORMAL LOW (ref 3.87–5.11)
RDW: 15.6 % — ABNORMAL HIGH (ref 11.5–15.5)
WBC: 6.6 10*3/uL (ref 4.0–10.5)
nRBC: 0 % (ref 0.0–0.2)

## 2023-11-27 LAB — GLUCOSE, CAPILLARY
Glucose-Capillary: 217 mg/dL — ABNORMAL HIGH (ref 70–99)
Glucose-Capillary: 201 mg/dL — ABNORMAL HIGH (ref 70–99)
Glucose-Capillary: 169 mg/dL — ABNORMAL HIGH (ref 70–99)
Glucose-Capillary: 219 mg/dL — ABNORMAL HIGH (ref 70–99)
Glucose-Capillary: 153 mg/dL — ABNORMAL HIGH (ref 70–99)

## 2023-11-27 MED ORDER — POTASSIUM CHLORIDE CRYS ER 20 MEQ PO TBCR
40.0000 meq | EXTENDED_RELEASE_TABLET | Freq: Once | ORAL | Status: AC
  Administered 2023-11-27: 40 meq via ORAL
  Filled 2023-11-27: qty 2

## 2023-11-27 NOTE — Progress Notes (Signed)
 Subjective: 3 Days Post-Op Procedure(s) (LRB): FIXATION, FRACTURE, INTERTROCHANTERIC, WITH INTRAMEDULLARY ROD (Left) Patient is out of bed in the chair.  She is alert and oriented.  Pain is under control. Moderate progress with PT. Plans are for her to go to [facility] skilled nursing soon. Hemoglobin stable at 8.3. Will return to my office in 2 weeks. 81 mg ASA twice daily for 6 to 8 weeks.  Patient reports pain as mild.  Objective:   VITALS:   Vitals:   11/27/23 0413 11/27/23 0802  BP: (!) 102/50 111/62  Pulse: 79 82  Resp: 18 16  Temp: (!) 97.4 F (36.3 C) 98.6 F (37 C)  SpO2: 95% 97%    Neurologically intact Sensation intact distally Dorsiflexion/Plantar flexion intact Incision: dressing C/D/I  LABS Recent Labs    11/25/23 0521 11/26/23 0329 11/27/23 0322  HGB 8.7* 8.1* 8.3*  HCT 27.9* 26.0* 27.6*  WBC 9.8 7.3 6.6  PLT 251 255 274    Recent Labs    11/25/23 0521 11/26/23 0329 11/27/23 0322  NA 140 137 141  K 3.7 3.7 3.4*  BUN 9 12 15   CREATININE 0.55 0.56 0.56  GLUCOSE 168* 130* 159*    No results for input(s): "LABPT", "INR" in the last 72 hours.   Assessment/Plan: 3 Days Post-Op Procedure(s) (LRB): FIXATION, FRACTURE, INTERTROCHANTERIC, WITH INTRAMEDULLARY ROD (Left)   Up with therapy Discharge to SNF Return to my office in 2 weeks. 81 mg ASA twice daily for 6 to 8 weeks.

## 2023-11-27 NOTE — TOC Progression Note (Signed)
 Transition of Care [facility]) - Progression Note    Patient Details  Name: Alisha Sanchez MRN: [id number] Date of Birth: [birth year]  Transition [facility]) CM/SW Contact  Alexandra Ice, RN Phone Number: 11/27/2023, 10:02 AM  Clinical Narrative:      Stella Edward contacted Debria Fang at [facility] initiated auth for [employer] and transportation with [facility], awaiting determination.        Expected Discharge Plan and Services                                               Social Determinants of Health (SDOH) Interventions SDOH Screenings   Food Insecurity: Unknown (11/24/2023)  Housing: Unknown (11/24/2023)  Transportation Needs: Unknown (11/24/2023)  Utilities: Not At Risk (11/24/2023)  Depression (PHQ2-9): Low Risk  (04/27/2019)  Financial Resource Strain: Patient Declined (09/27/2023)   Received from [facility]  Social Connections: Unknown (11/24/2023)  Tobacco Use: Low Risk  (11/23/2023)    Readmission Risk Interventions     No data to display

## 2023-11-27 NOTE — TOC Progression Note (Addendum)
 Transition of Care [facility]) - Progression Note    Patient Details  Name: Alisha Sanchez MRN: [id number] Date of Birth: Oct 14, 1955  Transition of Care [facility]) CM/SW Contact  Alexandra Ice, RN Phone Number: 11/27/2023, 4:44 PM  Clinical Narrative:      Received message from Debria Fang with [facility] received auth for SNF, authorization number, [id number], next review day 12/04/2023. Auth for transport with [facility] is pending MD review.     Notified MD of approval.   Expected Discharge Plan and Services                                               Social Determinants of Health (SDOH) Interventions SDOH Screenings   Food Insecurity: Unknown (11/24/2023)  Housing: Unknown (11/24/2023)  Transportation Needs: Unknown (11/24/2023)  Utilities: Not At Risk (11/24/2023)  Depression (PHQ2-9): Low Risk  (04/27/2019)  Financial Resource Strain: Patient Declined (09/27/2023)   Received from [facility]  Social Connections: Unknown (11/24/2023)  Tobacco Use: Low Risk  (11/23/2023)    Readmission Risk Interventions     No data to display

## 2023-11-27 NOTE — Progress Notes (Signed)
 Occupational Therapy Treatment Patient Details Name: Alisha Sanchez MRN: [id number] DOB: 1955/08/19 Today's Date: 11/27/2023   History of present illness Presented to fall while walking dog; admitted for management of L intertrochanteric hip fracture, s/p IM rodding.   OT comments  Alisha Sanchez was seen for OT treatment on this date. Upon arrival to room pt seated on toilet with PT for handoff, agreeable to tx. Pt requires MOD A + RW to stand from toilet, MIN A + RW for ADL t/f. MIN A standing grooming tasks. Pt making good progress toward goals, will continue to follow POC. Discharge recommendation remains appropriate.       If plan is discharge home, recommend the following:  A lot of help with bathing/dressing/bathroom;A lot of help with walking and/or transfers;Assistance with cooking/housework;Assist for transportation;Help with stairs or ramp for entrance   Equipment Recommendations  Other (comment) (defer)    Recommendations for Other Services      Precautions / Restrictions Precautions Precautions: Fall Recall of Precautions/Restrictions: Intact Restrictions Weight Bearing Restrictions Per Provider Order: Yes LLE Weight Bearing Per Provider Order: Partial weight bearing LLE Partial Weight Bearing Percentage or Pounds: 50%       Mobility Bed Mobility               General bed mobility comments: not tested    Transfers Overall transfer level: Needs assistance Equipment used: Rolling walker (2 wheels) Transfers: Sit to/from Stand Sit to Stand: Mod assist                 Balance Overall balance assessment: Needs assistance Sitting-balance support: No upper extremity supported, Feet supported Sitting balance-Leahy Scale: Good     Standing balance support: Bilateral upper extremity supported, During functional activity, Reliant on assistive device for balance Standing balance-Leahy Scale: Fair                             ADL either  performed or assessed with clinical judgement   ADL Overall ADL's : Needs assistance/impaired                                       General ADL Comments: MOD A + RW for toilet t/f. MIN A standign grooming tasks.    Extremity/Trunk Assessment              Vision       Restaurant manager, fast food Communication: No apparent difficulties   Cognition Arousal: Alert Behavior During Therapy: WFL for tasks assessed/performed, Anxious Cognition: No apparent impairments                               Following commands: Intact        Cueing      Exercises      Shoulder Instructions       General Comments      Pertinent Vitals/ Pain       Pain Assessment Pain Assessment: 0-10 Pain Score: 2  Pain Location: L hip Pain Descriptors / Indicators: Aching Pain Intervention(s): Limited activity within patient's tolerance, Repositioned  Home Living  Prior Functioning/Environment              Frequency  Min 2X/week        Progress Toward Goals  OT Goals(current goals can now be found in the care plan section)  Progress towards OT goals: Progressing toward goals  Acute Rehab OT Goals Patient Stated Goal: to go home OT Goal Formulation: With patient Time For Goal Achievement: 12/09/23 Potential to Achieve Goals: Good ADL Goals Pt Will Perform Grooming: with supervision;standing Pt Will Perform Upper Body Dressing: with supervision Pt Will Perform Lower Body Dressing: with supervision;sit to/from stand Pt Will Transfer to Toilet: ambulating;regular height toilet  Plan      Co-evaluation                 AM-PAC OT "6 Clicks" Daily Activity     Outcome Measure   Help from another person eating meals?: None Help from another person taking care of personal grooming?: A Little Help from another person toileting, which includes using  toliet, bedpan, or urinal?: A Lot Help from another person bathing (including washing, rinsing, drying)?: A Lot Help from another person to put on and taking off regular upper body clothing?: A Little Help from another person to put on and taking off regular lower body clothing?: A Lot 6 Click Score: 16    End of Session Equipment Utilized During Treatment: Rolling walker (2 wheels)  OT Visit Diagnosis: Unsteadiness on feet (R26.81);Other abnormalities of gait and mobility (R26.89)   Activity Tolerance Patient tolerated treatment well   Patient Left in chair;with call bell/phone within reach;with chair alarm set   Nurse Communication Mobility status        Time: 6440-3474 OT Time Calculation (min): 9 min  Charges: OT General Charges $OT Visit: 1 Visit OT Treatments $Self Care/Home Management : 8-22 mins  Gordan Latina, M.S. OTR/L  11/27/23, 12:47 PM  ascom [phone]

## 2023-11-27 NOTE — Progress Notes (Signed)
 Physical Therapy Treatment Patient Details Name: Alisha Sanchez MRN: [id number] DOB: [birth year] Today's Date: 11/27/2023   History of Present Illness Presented to fall while walking dog; admitted for management of L intertrochanteric hip fracture, s/p IM rodding.    PT Comments  Patient alert, oriented to self, place, reported 2-3/10 hip pain that increased to 7/10 with mobility. Required AAROM for L hip exercises, and modA to come to sitting EOB. Sit <> Stand with RW and minA, educated and verbal cues for PWB. She was able to ambulate to bathroom ~96ft, instructed in step to gait pattern. Left with OT on commode at end of session. The patient would benefit from further skilled PT intervention to continue to progress towards goals.    If plan is discharge home, recommend the following: A lot of help with walking and/or transfers;A lot of help with bathing/dressing/bathroom;Assistance with cooking/housework;Assist for transportation;Help with stairs or ramp for entrance   Can travel by private vehicle     Yes  Equipment Recommendations  BSC/3in1    Recommendations for Other Services       Precautions / Restrictions Precautions Precautions: Fall Recall of Precautions/Restrictions: Intact Restrictions Weight Bearing Restrictions Per Provider Order: Yes LLE Weight Bearing Per Provider Order: Partial weight bearing LLE Partial Weight Bearing Percentage or Pounds: 50%     Mobility  Bed Mobility Overal bed mobility: Needs Assistance Bed Mobility: Supine to Sit     Supine to sit: Mod assist          Transfers Overall transfer level: Needs assistance Equipment used: Rolling walker (2 wheels) Transfers: Sit to/from Stand Sit to Stand: Min assist                Ambulation/Gait Ambulation/Gait assistance: Contact guard assist Gait Distance (Feet): 18 Feet Assistive device: Rolling walker (2 wheels) Gait Pattern/deviations: Step-to pattern, Antalgic Gait  velocity: decreased     General Gait Details: cued for PWB status, RW positioning/management   Stairs             Wheelchair Mobility     Tilt Bed    Modified Rankin (Stroke Patients Only)       Balance Overall balance assessment: Needs assistance Sitting-balance support: No upper extremity supported, Feet supported Sitting balance-Leahy Scale: Good Sitting balance - Comments: Steady reaching within BOS   Standing balance support: Bilateral upper extremity supported, During functional activity, Reliant on assistive device for balance Standing balance-Leahy Scale: Fair Standing balance comment: Heavy reliant on RW                            Communication Communication Communication: No apparent difficulties  Cognition Arousal: Alert Behavior During Therapy: WFL for tasks assessed/performed   PT - Cognitive impairments: No apparent impairments                         Following commands: Intact      Cueing Cueing Techniques: Verbal cues, Tactile cues  Exercises General Exercises - Lower Extremity Ankle Circles/Pumps: AROM, 10 reps Heel Slides: AAROM, Strengthening, Left, 10 reps, AROM, Right Hip ABduction/ADduction: AAROM, Strengthening, Left, 10 reps, AROM, Right    General Comments        Pertinent Vitals/Pain Pain Assessment Pain Assessment: 0-10 Pain Score: 7  Pain Location: L hip after moving, 3/10 at rest Pain Descriptors / Indicators: Aching, Grimacing, Guarding    Home Living  Prior Function            PT Goals (current goals can now be found in the care plan section) Progress towards PT goals: Progressing toward goals    Frequency    7X/week      PT Plan      Co-evaluation              AM-PAC PT "6 Clicks" Mobility   Outcome Measure  Help needed turning from your back to your side while in a flat bed without using bedrails?: A Little Help needed moving from  lying on your back to sitting on the side of a flat bed without using bedrails?: A Lot Help needed moving to and from a bed to a chair (including a wheelchair)?: A Lot Help needed standing up from a chair using your arms (e.g., wheelchair or bedside chair)?: A Lot Help needed to walk in hospital room?: A Lot Help needed climbing 3-5 steps with a railing? : A Lot 6 Click Score: 13    End of Session Equipment Utilized During Treatment: Gait belt Activity Tolerance: Patient tolerated treatment well Patient left:  (seated on commode with OT) Nurse Communication: Mobility status PT Visit Diagnosis: Muscle weakness (generalized) (M62.81);Other abnormalities of gait and mobility (R26.89);Pain Pain - Right/Left: Left Pain - part of body: Hip     Time: 8657-8469 PT Time Calculation (min) (ACUTE ONLY): 17 min  Charges:    $Therapeutic Activity: 8-22 mins PT General Charges $$ ACUTE PT VISIT: 1 Visit                     Darien Eden PT, DPT 2:05 PM,11/27/23

## 2023-11-27 NOTE — Progress Notes (Signed)
 Progress Note   Patient: Alisha Sanchez BJY:[id number] DOB: [birth year] DOA: 11/23/2023     4 DOS: the patient was seen and examined on 11/27/2023   Brief hospital course: 68yo with h/o thyroid  CA s/p RAI, HTN, DM, and lower eyelid melanoma s/p Mohs (04/2023) who presented on 5/23 with a mechanical fall resulting in a left intertrochanteric hip fracture.  ORIF on 5/24.  Assessment and Plan:  Closed intertrochanteric fracture of hip, left, initial encounter  Mechanical fall Pain control Orthopedics consulted ORIF on 5/24 PT/OT post-operatively On alendronate for osteoporosis; consider alternative therapy Needs SNF rehab, medically stable   ABLA on chronic anemia Mild microcytic anemia on presentation Worsened, likely due to ABLA Now stable   Type 2 diabetes mellitus with hyperglycemia, without long-term current use of insulin   A1c is 6.7, good control Hold glipizide, Jaumet - resume at time of dc Cover with moderate-scale SSI Carb modified diet    Chronic pain disorder I have reviewed this patient in the [place] Controlled Substances Reporting System.  She is receiving medications from only one provider and is only receiving tramadol  periodically She is not at particularly high risk of opioid misuse, diversion, or overdose.    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   Thyroid  cancer s/p radioactive iodine  2009  Continue levothyroxine  112 mcg   History of pulmonary embolism S/p IVC filter Not currently on anticoagulants   Essential hypertension Continue amlodipine  Resume hydrochlorothiazide  on 5/25   HLD Continue rosuvastatin    Hypothyroidism Continue Synthroid    Mood d/o Continue citalopram   Nutrition Status: Nutrition Problem: Increased nutrient needs Etiology: hip fracture, acute illness Signs/Symptoms: estimated needs Interventions: Ensure Enlive (each supplement provides 350kcal and 20 grams of protein)       *She is  medically stable and awaiting insurance authorization for SNF rehab placement.       Consultants: Orthopedics   Procedures: Hip repair 5/24   Antibiotics: Cefazolin  x 1  Recommendations at discharge:    You are being discharged to skilled nursing facility for rehabilitation Take 81 mg aspirin twice daily for 6-8 weeks Follow up with Dr. Annabell Key (orthopedics) in 2 weeks    30 Day Unplanned Readmission Risk Score    Flowsheet Row ED to Hosp-Admission (Current) from 11/23/2023 in [facility] (1A)  30 Day Unplanned Readmission Risk Score (%) 18.25 Filed at 11/27/2023 1200       This score is the patient's risk of an unplanned readmission within 30 days of being discharged (0 -100%). The score is based on dignosis, age, lab data, medications, orders, and past utilization.   Low:  0-14.9   Medium: 15-21.9   High: 22-29.9   Extreme: 30 and above           Subjective: Feeling fine, no new issues.  She states they she is just not going to walk her dog anymore so she won't ever fall again.   Objective: Vitals:   11/27/23 0413 11/27/23 0802  BP: (!) 102/50 111/62  Pulse: 79 82  Resp: 18 16  Temp: (!) 97.4 F (36.3 C) 98.6 F (37 C)  SpO2: 95% 97%    Intake/Output Summary (Last 24 hours) at 11/27/2023 1428 Last data filed at 11/27/2023 1404 Gross per 24 hour  Intake 340 ml  Output 1525 ml  Net -1185 ml   Filed Weights   11/24/23 1035  Weight: 81.6 kg    Exam:  General:  Appears calm and  comfortable and is in NAD Eyes:  normal lids, iris ENT:  grossly normal hearing, lips & tongue, mmm Cardiovascular:  RRR, no m/r/g. No LE edema.  Respiratory:   CTA bilaterally with no wheezes/rales/rhonchi.  Normal respiratory effort. Abdomen:  soft, NT, ND Skin:  no rash or induration seen on limited exam Musculoskeletal:  bandage on L hip is C/D/I Psychiatric:  grossly normal mood and affect, speech fluent and appropriate, A&O x  3 Neurologic:  CN 2-12 grossly intact, moves all extremities in coordinated fashion  Data Reviewed: I have reviewed the patient's lab results since admission.  Pertinent labs for today include:   K+ 3.4 Glucose 159 WBC 6.6 Hgb 8.3, stable     Family Communication: None present  Disposition: Status is: Inpatient Remains inpatient appropriate because: awaiting placement     Time spent: 35 minutes  Unresulted Labs (From admission, onward)    None        Author: Lorita Rosa, MD 11/27/2023 2:28 PM  For on call review [email or web address].

## 2023-11-28 DIAGNOSIS — C4310 Malignant melanoma of unspecified eyelid, including canthus: Secondary | ICD-10-CM | POA: Diagnosis not present

## 2023-11-28 DIAGNOSIS — Z471 Aftercare following joint replacement surgery: Secondary | ICD-10-CM | POA: Diagnosis not present

## 2023-11-28 DIAGNOSIS — F39 Unspecified mood [affective] disorder: Secondary | ICD-10-CM | POA: Diagnosis not present

## 2023-11-28 DIAGNOSIS — R600 Localized edema: Secondary | ICD-10-CM | POA: Diagnosis not present

## 2023-11-28 DIAGNOSIS — E7800 Pure hypercholesterolemia, unspecified: Secondary | ICD-10-CM | POA: Diagnosis not present

## 2023-11-28 DIAGNOSIS — Z7401 Bed confinement status: Secondary | ICD-10-CM | POA: Diagnosis not present

## 2023-11-28 DIAGNOSIS — D649 Anemia, unspecified: Secondary | ICD-10-CM | POA: Diagnosis not present

## 2023-11-28 DIAGNOSIS — S79912A Unspecified injury of left hip, initial encounter: Secondary | ICD-10-CM | POA: Diagnosis not present

## 2023-11-28 DIAGNOSIS — G894 Chronic pain syndrome: Secondary | ICD-10-CM | POA: Diagnosis not present

## 2023-11-28 DIAGNOSIS — S72142A Displaced intertrochanteric fracture of left femur, initial encounter for closed fracture: Secondary | ICD-10-CM | POA: Diagnosis not present

## 2023-11-28 DIAGNOSIS — W19XXXD Unspecified fall, subsequent encounter: Secondary | ICD-10-CM | POA: Diagnosis not present

## 2023-11-28 DIAGNOSIS — K219 Gastro-esophageal reflux disease without esophagitis: Secondary | ICD-10-CM | POA: Diagnosis not present

## 2023-11-28 DIAGNOSIS — Z95828 Presence of other vascular implants and grafts: Secondary | ICD-10-CM | POA: Diagnosis not present

## 2023-11-28 DIAGNOSIS — M8000XD Age-related osteoporosis with current pathological fracture, unspecified site, subsequent encounter for fracture with routine healing: Secondary | ICD-10-CM | POA: Diagnosis not present

## 2023-11-28 DIAGNOSIS — R1310 Dysphagia, unspecified: Secondary | ICD-10-CM | POA: Diagnosis not present

## 2023-11-28 DIAGNOSIS — D509 Iron deficiency anemia, unspecified: Secondary | ICD-10-CM | POA: Diagnosis not present

## 2023-11-28 DIAGNOSIS — Z85850 Personal history of malignant neoplasm of thyroid: Secondary | ICD-10-CM | POA: Diagnosis not present

## 2023-11-28 DIAGNOSIS — E785 Hyperlipidemia, unspecified: Secondary | ICD-10-CM | POA: Diagnosis not present

## 2023-11-28 DIAGNOSIS — I10 Essential (primary) hypertension: Secondary | ICD-10-CM | POA: Diagnosis not present

## 2023-11-28 DIAGNOSIS — Z85828 Personal history of other malignant neoplasm of skin: Secondary | ICD-10-CM | POA: Diagnosis not present

## 2023-11-28 DIAGNOSIS — K5900 Constipation, unspecified: Secondary | ICD-10-CM | POA: Diagnosis not present

## 2023-11-28 DIAGNOSIS — E1165 Type 2 diabetes mellitus with hyperglycemia: Secondary | ICD-10-CM | POA: Diagnosis not present

## 2023-11-28 DIAGNOSIS — E039 Hypothyroidism, unspecified: Secondary | ICD-10-CM | POA: Diagnosis not present

## 2023-11-28 DIAGNOSIS — N3281 Overactive bladder: Secondary | ICD-10-CM | POA: Diagnosis not present

## 2023-11-28 DIAGNOSIS — S72142D Displaced intertrochanteric fracture of left femur, subsequent encounter for closed fracture with routine healing: Secondary | ICD-10-CM | POA: Diagnosis not present

## 2023-11-28 DIAGNOSIS — Z86711 Personal history of pulmonary embolism: Secondary | ICD-10-CM | POA: Diagnosis not present

## 2023-11-28 LAB — GLUCOSE, CAPILLARY
Glucose-Capillary: 204 mg/dL — ABNORMAL HIGH (ref 70–99)
Glucose-Capillary: 299 mg/dL — ABNORMAL HIGH (ref 70–99)

## 2023-11-28 MED ORDER — FERROUS SULFATE 325 (65 FE) MG PO TABS: 325.0000 mg | ORAL_TABLET | Freq: Every day | ORAL | Status: AC

## 2023-11-28 MED ORDER — ENSURE ENLIVE PO LIQD: 237.0000 mL | Freq: Two times a day (BID) | ORAL | Status: AC

## 2023-11-28 MED ORDER — ACETAMINOPHEN 325 MG PO TABS: 650.0000 mg | ORAL_TABLET | Freq: Four times a day (QID) | ORAL | Status: AC | PRN

## 2023-11-28 MED ORDER — ALUM & MAG HYDROXIDE-SIMETH 200-200-20 MG/5ML PO SUSP: 30.0000 mL | ORAL | Status: AC | PRN

## 2023-11-28 MED ORDER — SENNA 8.6 MG PO TABS: 1.0000 | ORAL_TABLET | Freq: Two times a day (BID) | ORAL | Status: AC

## 2023-11-28 MED ORDER — HYDROCODONE-ACETAMINOPHEN 5-325 MG PO TABS: 1.0000 | ORAL_TABLET | ORAL | 0 refills | Status: AC | PRN

## 2023-11-28 MED ORDER — TRAMADOL HCL 50 MG PO TABS: 50.0000 mg | ORAL_TABLET | Freq: Four times a day (QID) | ORAL | 0 refills | Status: AC | PRN

## 2023-11-28 MED ORDER — MAGNESIUM HYDROXIDE 400 MG/5ML PO SUSP: 30.0000 mL | Freq: Every day | ORAL | Status: AC | PRN

## 2023-11-28 MED ORDER — BISACODYL 10 MG RE SUPP: 10.0000 mg | Freq: Every day | RECTAL | Status: AC | PRN

## 2023-11-28 MED ORDER — GABAPENTIN 100 MG PO CAPS: 200.0000 mg | ORAL_CAPSULE | Freq: Two times a day (BID) | ORAL | Status: AC

## 2023-11-28 NOTE — Progress Notes (Signed)
 Discharge criteria met

## 2023-11-28 NOTE — TOC Transition Note (Signed)
 Transition of Care [facility]) - Discharge Note   Patient Details  Name: Alisha Sanchez MRN: [id number] Date of Birth: 28-Feb-1956  [facility]) CM/SW Contact:  Alexandra Ice, RN Phone Number: 11/28/2023, 12:01 PM   Clinical Narrative:     Met with patient and spouse at bedside. CMS letter reviewed and signed copy provided. TOC discussed discharge plan, updated them that insurance approved stay at [facility], still waiting approval on transport. She stated she feels fine transporting in vehicle with spouse.   11:19A - Contacted spouse, Alisha Sanchez, notified him that patient received approval for ambulance transport, they are agreeble to transport by [facility]. Spoke with Mont Antis at [facility], she is #3 on schedule. Patient going to room 510, nurse to call report to [phone]. EMS packet printed to nurse station. MD and bedside notified.   Final next level of care: Skilled Nursing Facility Barriers to Discharge: Barriers Resolved   Patient Goals and CMS Choice Patient states their goals for this hospitalization and ongoing recovery are:: get better CMS Medicare.gov Compare Post Acute Care list provided to:: Patient Choice offered to / list presented to : Patient      Discharge Placement                Patient to be transferred to facility by: [facility] Name of family member notified: Alisha Sanchez Patient and family notified of of transfer: 11/28/23  Discharge Plan and Services Additional resources added to the After Visit Summary for                    DME Agency: NA       HH Arranged: NA          Social Drivers of Health (SDOH) Interventions SDOH Screenings   Food Insecurity: Unknown (11/24/2023)  Housing: Unknown (11/24/2023)  Transportation Needs: Unknown (11/24/2023)  Utilities: Not At Risk (11/24/2023)  Depression (PHQ2-9): Low Risk  (04/27/2019)  Financial Resource Strain: Patient Declined (09/27/2023)   Received from [facility]  System  Social Connections: Unknown (11/24/2023)  Tobacco Use: Low Risk  (11/23/2023)     Readmission Risk Interventions     No data to display

## 2023-11-28 NOTE — Discharge Summary (Signed)
 Physician Discharge Summary   Patient: Alisha Sanchez MRN: [id number] DOB: [birth year]  Admit date:     11/23/2023  Discharge date: 11/28/23  Discharge Physician: Montey Apa   PCP: Lyle San, MD   Recommendations at discharge:   You are being discharged to rehab today for PT Follow up with Orthopedic surgery in 2 weeks Take aspirin 81 mg TWICE daily for 6-8 weeks to prevent blood clots Follow up with Primary Care in 1-2 weeks Repeat CBC, BMP at follow up   Discharge Diagnoses: Principal Problem:   Closed intertrochanteric fracture of hip, left, initial encounter [facility]) Active Problems:   Uncontrolled type 2 diabetes mellitus with hyperglycemia, without long-term current use of insulin  (HCC)   Essential hypertension   History of pulmonary embolism   Thyroid  cancer s/p radioactive iodine  2009 (HCC)   S/P IVC filter   Malignant melanoma of face (HCC)   Chronic pain disorder  Resolved Problems:   * No resolved hospital problems. [facility] Course:  [phone] with h/o thyroid  CA s/p RAI, HTN, DM, and lower eyelid melanoma s/p Mohs (04/2023) who presented on 5/23 with a mechanical fall resulting in a left intertrochanteric hip fracture. ORIF on 5/24.   Further hospital course and management as outlined below.  5/28/225 -- pt doing well this AM, seen up in recliner after working with PT.  Pt reports having BM this AM, little loose.  No other acute complaints.  Reports pain as moderate, overall controlled with medication. Patient is medically stable for d/c to rehab today.    Assessment and Plan:  Closed intertrochanteric fracture of hip, left, initial encounter  In setting of mechanical fall --Pain control as needed  --Orthopedics consulted -- status post ORIF on 5/24 --Follow up with Ortho in 2 weeks --ASA 81 mg BID x 6-8 weeks for DVT prophylaxis --PT/OT -- continue at rehab --On alendronate for osteoporosis; consider alternative therapy    ABLA on chronic  anemia Mild microcytic anemia on presentation Worsened, likely due to ABLA Now stable --Repeat CBC in 1-2 weeks --Started on iron supplement   Type 2 diabetes mellitus with hyperglycemia, without long-term current use of insulin   A1c is 6.7, good control -Resume home glipizide, Jaumet at d/c --Covered with moderate-scale SSI during admission --Carb modified diet    Chronic pain disorder Dr. Murrel Arnt reviewed this patient in the [place] Controlled Substances Reporting System -- receiving medications from only one provider and is only receiving tramadol  periodically Agree patient is not at particularly high risk of opioid misuse, diversion, or overdose.    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   Thyroid  cancer s/p radioactive iodine  2009  Continue levothyroxine  112 mcg   History of pulmonary embolism S/p IVC filter Not currently on anticoagulants   Essential hypertension Continue amlodipine  Resume hydrochlorothiazide  on 5/25   HLD Continue rosuvastatin    Hypothyroidism Continue Synthroid    Mood d/o Continue citalopram    Nutrition Status: Nutrition Problem: Increased nutrient needs Etiology: hip fracture, acute illness Signs/Symptoms: estimated needs Interventions: Ensure Enlive (each supplement provides 350kcal and 20 grams of protein)         Consultants: Orthopedic surgery Procedures performed: Left hip ORIF  Disposition: Skilled nursing facility Diet recommendation:  Carb modified diet DISCHARGE MEDICATION: Allergies as of 11/28/2023   No Known Allergies      Medication List     STOP taking these medications    celecoxib  100 MG capsule Commonly known as: CELEBREX   polyethylene glycol 17 g packet Commonly known as: MIRALAX  / GLYCOLAX    zolpidem 5 MG tablet Commonly known as: AMBIEN       TAKE these medications    acetaminophen  325 MG tablet Commonly known as: TYLENOL  Take 2 tablets (650 mg total) by mouth  every 6 (six) hours as needed for mild pain (pain score 1-3), fever or headache (or temp > 100.5).   alendronate 70 MG tablet Commonly known as: FOSAMAX Take 70 mg by mouth once a week. Take with a full glass of water  on an empty stomach.   alum & mag hydroxide-simeth 200-200-20 MG/5ML suspension Commonly known as: MAALOX/MYLANTA Take 30 mLs by mouth every 4 (four) hours as needed for indigestion.   amLODipine  5 MG tablet Commonly known as: NORVASC  Take 5 mg by mouth at bedtime.   bisacodyl  10 MG suppository Commonly known as: DULCOLAX Place 1 suppository (10 mg total) rectally daily as needed for moderate constipation.   citalopram 10 MG tablet Commonly known as: CELEXA Take 1 tablet by mouth daily.   estradiol  0.1 MG/GM vaginal cream Commonly known as: ESTRACE  Estrogen Cream Instruction Discard applicator Apply pea sized amount to tip of finger to urethra before bed. Wash hands well after application. Use Monday, Wednesday and Friday   feeding supplement Liqd Take 237 mLs by mouth 2 (two) times daily between meals.   ferrous sulfate  325 (65 FE) MG tablet Take 1 tablet (325 mg total) by mouth daily with breakfast. Start taking on: Nov 29, 2023   gabapentin  100 MG capsule Commonly known as: NEURONTIN  Take 2 capsules (200 mg total) by mouth 2 (two) times daily.   glipiZIDE 2.5 MG 24 hr tablet Commonly known as: GLUCOTROL XL Take 2.5 mg by mouth daily.   hydrochlorothiazide  25 MG tablet Commonly known as: HYDRODIURIL  Take 25 mg by mouth daily.   HYDROcodone -acetaminophen  5-325 MG tablet Commonly known as: NORCO/VICODIN Take 1-2 tablets by mouth every 4 (four) hours as needed for severe pain (pain score 7-10).   Janumet  50-1000 MG tablet Generic drug: sitaGLIPtin -metformin  Take 1 tablet by mouth 2 (two) times daily.   magnesium  hydroxide 400 MG/5ML suspension Commonly known as: MILK OF MAGNESIA Take 30 mLs by mouth daily as needed for mild constipation.    Melatonin 10 MG Tabs Take 1 tablet by mouth at bedtime as needed.   MULTIVITAMIN ADULT PO Take 2 tablets by mouth daily.   oxybutynin  10 MG 24 hr tablet Commonly known as: DITROPAN -XL Take 1 tablet (10 mg total) by mouth at bedtime.   pantoprazole  40 MG tablet Commonly known as: PROTONIX  Take 40 mg by mouth daily.   rosuvastatin  10 MG tablet Commonly known as: CRESTOR  Take 10 mg by mouth at bedtime.   senna 8.6 MG Tabs tablet Commonly known as: SENOKOT Take 1 tablet (8.6 mg total) by mouth 2 (two) times daily.   Synthroid  112 MCG tablet Generic drug: levothyroxine  Take 112 mcg by mouth daily.   traMADol  50 MG tablet Commonly known as: ULTRAM  Take 1 tablet (50 mg total) by mouth every 6 (six) hours as needed for moderate pain (pain score 4-6). What changed: reasons to take this               Discharge Care Instructions  (From admission, onward)           Start     Ordered   11/28/23 0000  Leave dressing on - Keep it clean, dry, and intact until clinic visit  11/28/23 0944            Contact information for follow-up providers     Marlynn Singer, MD. Schedule an appointment as soon as possible for a visit in 2 week(s).   Specialty: Orthopedic Surgery Why: For suture removal, For wound re-check, For re-evaluation  Please make appointment prior to the patient's discharge Contact information: [address] [place] [address] [phone]              Contact information for after-discharge care     Destination     [facility] Preferred SNF .   Service: Skilled Nursing Contact information: [address] [place]  [phone]                    Discharge Exam: Cleavon Curls Weights   11/24/23 1035  Weight: 81.6 kg   General exam: awake, alert, no acute distress HEENT: atraumatic, clear conjunctiva, anicteric  sclera, moist mucus membranes, hearing grossly normal  Respiratory system: CTAB, no wheezes, rales or rhonchi, normal respiratory effort. Cardiovascular system: normal S1/S2, RRR, no JVD, murmurs, rubs, gallops, no pedal edema.   Gastrointestinal system: soft, NT, ND, no HSM felt, +bowel sounds. Central nervous system: A&O x3. no gross focal neurologic deficits, normal speech Extremities: left hip dressing clean dry intact with no signs of ecchymosis or surrounding warmth or erythema Skin: dry, intact, normal temperature Psychiatry: normal mood, congruent affect, judgement and insight appear normal   Condition at discharge: stable  The results of significant diagnostics from this hospitalization (including imaging, microbiology, ancillary and laboratory) are listed below for reference.   Imaging Studies: DG HIP UNILAT WITH PELVIS 2-3 VIEWS LEFT Result Date: 11/24/2023 CLINICAL DATA:  [id number] Surgery, elective [id number] EXAM: DG HIP (WITH OR WITHOUT PELVIS) 2-3V LEFT COMPARISON:  Nov 23, 2023 FINDINGS: Spot fluoroscopy images were obtained for surgical planning purposes. This demonstrates placement of an intramedullary rod within the femur. Improved alignment of fracture fragments. Time: 55 seconds Dose: 12.25 mGy Please reference procedure report for further details. IMPRESSION: Fluoroscopic images were obtained for surgical planning purposes. Electronically Signed   By: Clancy Crimes M.D.   On: 11/24/2023 13:40   DG C-Arm 1-60 Min-No Report Result Date: 11/24/2023 Fluoroscopy was utilized by the requesting physician.  No radiographic interpretation.   DG HIP UNILAT WITH PELVIS 2-3 VIEWS LEFT Result Date: 11/23/2023 CLINICAL DATA:  Status post fall. EXAM: DG HIP (WITH OR WITHOUT PELVIS) 2-3V LEFT COMPARISON:  None Available. FINDINGS: Acute nondisplaced fracture deformity is seen extending through the inter trochanteric region of the proximal left femur. There is no evidence of dislocation.  Degenerative changes seen in the form of joint space narrowing and acetabular sclerosis. IMPRESSION: Acute nondisplaced intertrochanteric fracture of the proximal left femur. Electronically Signed   By: Virgle Grime M.D.   On: 11/23/2023 23:11   DG Chest 1 View Result Date: 11/23/2023 CLINICAL DATA:  Status post fall. EXAM: CHEST  1 VIEW COMPARISON:  December 23, 2020 FINDINGS: The heart size and mediastinal contours are within normal limits. Both lungs are clear. Multiple chronic left-sided rib fractures are seen. Multilevel degenerative changes seen throughout the thoracic spine. IMPRESSION: No active cardiopulmonary disease. Electronically Signed   By: Virgle Grime M.D.   On: 11/23/2023 22:54    Microbiology: Results for orders placed or performed in visit on 01/02/22  Microscopic Examination     Status: Abnormal   Collection  Time: 01/02/22  1:57 PM   Urine  Result Value Ref Range Status   WBC, UA 0-5 0 - 5 /hpf Final   RBC, Urine 0-2 0 - 2 /hpf Final   Epithelial Cells (non renal) 0-10 0 - 10 /hpf Final   Mucus, UA Present (A) Not Estab. Final   Bacteria, UA Many (A) None seen/Few Final    Labs: CBC: Recent Labs  Lab 11/23/23 2206 11/24/23 1620 11/25/23 0521 11/26/23 0329 11/27/23 0322  WBC 6.2 13.0* 9.8 7.3 6.6  NEUTROABS  --   --  8.6* 5.2 4.4  HGB 10.2* 9.4* 8.7* 8.1* 8.3*  HCT 32.6* 30.2* 27.9* 26.0* 27.6*  MCV 74.8* 75.3* 74.0* 74.9* 75.2*  PLT 305 231 251 255 274   Basic Metabolic Panel: Recent Labs  Lab 11/23/23 2206 11/25/23 0521 11/26/23 0329 11/27/23 0322  NA 139 140 137 141  K 3.8 3.7 3.7 3.4*  CL 104 105 100 101  CO2 22 24 28 29   GLUCOSE 236* 168* 130* 159*  BUN 18 9 12 15   CREATININE 0.71 0.55 0.56 0.56  CALCIUM  8.9 8.5* 8.4* 8.6*   Liver Function Tests: Recent Labs  Lab 11/23/23 2206  AST 25  ALT 12  ALKPHOS 58  BILITOT 0.5  PROT 7.0  ALBUMIN 3.8   CBG: Recent Labs  Lab 11/27/23 0759 11/27/23 1145 11/27/23 1643 11/27/23 2102  11/28/23 0713  GLUCAP 201* 169* 219* 153* 204*    Discharge time spent: less than 30 minutes.  Signed: Montey Apa, DO [facility] 11/28/2023

## 2023-11-28 NOTE — Care Management Important Message (Signed)
 Important Message  Patient Details  Name: Alisha Sanchez MRN: [id number] Date of Birth: 01/15/1956   Important Message Given:  Yes - Medicare IM     Alese Furniss W, CMA 11/28/2023, 11:56 AM

## 2023-11-28 NOTE — TOC Progression Note (Signed)
 Transition of Care [facility]) - Progression Note    Patient Details  Name: Alisha Sanchez MRN: [id number] Date of Birth: Jun 10, 1956  Transition of [facility]) CM/SW Contact  Alexandra Ice, RN Phone Number: 11/28/2023, 8:53 AM  Clinical Narrative:    Patient received [employer] approval for [employer], sent message to [facility] to see they are able to accept patient today, awaiting response.        Expected Discharge Plan and Services                                               Social Determinants of Health (SDOH) Interventions SDOH Screenings   Food Insecurity: Unknown (11/24/2023)  Housing: Unknown (11/24/2023)  Transportation Needs: Unknown (11/24/2023)  Utilities: Not At Risk (11/24/2023)  Depression (PHQ2-9): Low Risk  (04/27/2019)  Financial Resource Strain: Patient Declined (09/27/2023)   Received from [facility]  Social Connections: Unknown (11/24/2023)  Tobacco Use: Low Risk  (11/23/2023)    Readmission Risk Interventions     No data to display

## 2023-11-28 NOTE — Progress Notes (Signed)
 Occupational Therapy Treatment Patient Details Name: Alisha Sanchez MRN: [id number] DOB: 1956-06-11 Today's Date: 11/28/2023   History of present illness Presented to fall while walking dog; admitted for management of L intertrochanteric hip fracture, s/p IM rodding.   OT comments  Alisha Sanchez seen for OT treatment on this date. Upon arrival to room ambulating to bathroom with mobility specialist, agreeable to tx. Pt requires CGA +RW for toileting and standing grooming tasks. Pt provided ice and repositioned in chair to address pain. Pt making good progress toward goals, will continue to follow POC. Discharge recommendation remains appropriate.        If plan is discharge home, recommend the following:  A lot of help with bathing/dressing/bathroom;A lot of help with walking and/or transfers;Assistance with cooking/housework;Assist for transportation;Help with stairs or ramp for entrance   Equipment Recommendations  Other (comment)    Recommendations for Other Services      Precautions / Restrictions Precautions Precautions: Fall Recall of Precautions/Restrictions: Intact Restrictions Weight Bearing Restrictions Per Provider Order: Yes LLE Weight Bearing Per Provider Order: Partial weight bearing LLE Partial Weight Bearing Percentage or Pounds: 50%       Mobility Bed Mobility               General bed mobility comments: Not tested    Transfers Overall transfer level: Needs assistance Equipment used: Rolling walker (2 wheels) Transfers: Sit to/from Stand Sit to Stand: Contact guard assist                 Balance Overall balance assessment: Needs assistance Sitting-balance support: Single extremity supported, Feet supported Sitting balance-Leahy Scale: Good     Standing balance support: Bilateral upper extremity supported, During functional activity, Reliant on assistive device for balance Standing balance-Leahy Scale: Fair                              ADL either performed or assessed with clinical judgement   ADL Overall ADL's : Needs assistance/impaired                                       General ADL Comments: CGA for toileting and standing grooming tasks    Extremity/Trunk Assessment Upper Extremity Assessment Upper Extremity Assessment: Overall WFL for tasks assessed   Lower Extremity Assessment Lower Extremity Assessment: LLE deficits/detail;Generalized weakness LLE Deficits / Details: pain, PWB        Vision       Perception     Praxis     Communication Communication Communication: No apparent difficulties   Cognition Arousal: Alert Behavior During Therapy: WFL for tasks assessed/performed Cognition: No apparent impairments                               Following commands: Intact        Cueing   Cueing Techniques: Verbal cues  Exercises      Shoulder Instructions       General Comments      Pertinent Vitals/ Pain       Pain Assessment Pain Assessment: 0-10 Pain Score: 9  Pain Location: L hip Pain Descriptors / Indicators: Aching, Grimacing, Guarding Pain Intervention(s): Limited activity within patient's tolerance, Monitored during session, Repositioned, Premedicated before session, Ice applied  Home Living  Prior Functioning/Environment              Frequency  Min 2X/week        Progress Toward Goals  OT Goals(current goals can now be found in the care plan section)     Acute Rehab OT Goals Patient Stated Goal: to go home OT Goal Formulation: With patient Time For Goal Achievement: 12/12/23 Potential to Achieve Goals: Good  Plan      Co-evaluation                 AM-PAC OT "6 Clicks" Daily Activity     Outcome Measure   Help from another person eating meals?: None Help from another person taking care of personal grooming?: A Little Help from another  person toileting, which includes using toliet, bedpan, or urinal?: A Little Help from another person bathing (including washing, rinsing, drying)?: A Lot Help from another person to put on and taking off regular upper body clothing?: A Little Help from another person to put on and taking off regular lower body clothing?: A Lot 6 Click Score: 17    End of Session Equipment Utilized During Treatment: Gait belt;Rolling walker (2 wheels)  OT Visit Diagnosis: Unsteadiness on feet (R26.81);Other abnormalities of gait and mobility (R26.89)   Activity Tolerance Patient tolerated treatment well   Patient Left in chair;with call bell/phone within reach;with chair alarm set   Nurse Communication          Time: [phone] OT Time Calculation (min): 18 min  Charges: OT General Charges $OT Visit: 1 Visit OT Treatments $Self Care/Home Management : 8-22 mins  Stevenson Elbe, Student OT   [employer] 11/28/2023, 10:53 AM

## 2023-11-28 NOTE — Plan of Care (Signed)
  Problem: Education: Goal: Ability to describe self-care measures that may prevent or decrease complications (Diabetes Survival Skills Education) will improve Outcome: Progressing   Problem: Coping: Goal: Ability to adjust to condition or change in health will improve Outcome: Progressing   Problem: Skin Integrity: Goal: Risk for impaired skin integrity will decrease Outcome: Progressing   Problem: Tissue Perfusion: Goal: Adequacy of tissue perfusion will improve Outcome: Progressing

## 2023-11-28 NOTE — Plan of Care (Signed)
  Problem: Coping: Goal: Ability to adjust to condition or change in health will improve Outcome: Progressing   Problem: Metabolic: Goal: Ability to maintain appropriate glucose levels will improve Outcome: Progressing   Problem: Clinical Measurements: Goal: Diagnostic test results will improve Outcome: Progressing   Problem: Activity: Goal: Risk for activity intolerance will decrease Outcome: Progressing   Problem: Coping: Goal: Level of anxiety will decrease Outcome: Progressing   Problem: Safety: Goal: Ability to remain free from injury will improve Outcome: Progressing   Problem: Pain Management: Goal: Pain level will decrease Outcome: Progressing

## 2023-11-30 DIAGNOSIS — W19XXXD Unspecified fall, subsequent encounter: Secondary | ICD-10-CM | POA: Diagnosis not present

## 2023-11-30 DIAGNOSIS — M8000XD Age-related osteoporosis with current pathological fracture, unspecified site, subsequent encounter for fracture with routine healing: Secondary | ICD-10-CM | POA: Diagnosis not present

## 2023-11-30 DIAGNOSIS — E7800 Pure hypercholesterolemia, unspecified: Secondary | ICD-10-CM | POA: Diagnosis not present

## 2023-11-30 DIAGNOSIS — G894 Chronic pain syndrome: Secondary | ICD-10-CM | POA: Diagnosis not present

## 2023-11-30 DIAGNOSIS — D649 Anemia, unspecified: Secondary | ICD-10-CM | POA: Diagnosis not present

## 2023-11-30 DIAGNOSIS — E039 Hypothyroidism, unspecified: Secondary | ICD-10-CM | POA: Diagnosis not present

## 2023-11-30 DIAGNOSIS — Z85850 Personal history of malignant neoplasm of thyroid: Secondary | ICD-10-CM | POA: Diagnosis not present

## 2023-11-30 DIAGNOSIS — S72142D Displaced intertrochanteric fracture of left femur, subsequent encounter for closed fracture with routine healing: Secondary | ICD-10-CM | POA: Diagnosis not present

## 2023-11-30 DIAGNOSIS — I10 Essential (primary) hypertension: Secondary | ICD-10-CM | POA: Diagnosis not present

## 2023-11-30 DIAGNOSIS — E1165 Type 2 diabetes mellitus with hyperglycemia: Secondary | ICD-10-CM | POA: Diagnosis not present

## 2023-11-30 DIAGNOSIS — Z85828 Personal history of other malignant neoplasm of skin: Secondary | ICD-10-CM | POA: Diagnosis not present

## 2023-11-30 DIAGNOSIS — Z86711 Personal history of pulmonary embolism: Secondary | ICD-10-CM | POA: Diagnosis not present

## 2023-12-04 DIAGNOSIS — M8000XD Age-related osteoporosis with current pathological fracture, unspecified site, subsequent encounter for fracture with routine healing: Secondary | ICD-10-CM | POA: Diagnosis not present

## 2023-12-04 DIAGNOSIS — E1165 Type 2 diabetes mellitus with hyperglycemia: Secondary | ICD-10-CM | POA: Diagnosis not present

## 2023-12-04 DIAGNOSIS — G894 Chronic pain syndrome: Secondary | ICD-10-CM | POA: Diagnosis not present

## 2023-12-04 DIAGNOSIS — Z86711 Personal history of pulmonary embolism: Secondary | ICD-10-CM | POA: Diagnosis not present

## 2023-12-04 DIAGNOSIS — K5900 Constipation, unspecified: Secondary | ICD-10-CM | POA: Diagnosis not present

## 2023-12-04 DIAGNOSIS — E039 Hypothyroidism, unspecified: Secondary | ICD-10-CM | POA: Diagnosis not present

## 2023-12-04 DIAGNOSIS — Z85850 Personal history of malignant neoplasm of thyroid: Secondary | ICD-10-CM | POA: Diagnosis not present

## 2023-12-04 DIAGNOSIS — D649 Anemia, unspecified: Secondary | ICD-10-CM | POA: Diagnosis not present

## 2023-12-04 DIAGNOSIS — I10 Essential (primary) hypertension: Secondary | ICD-10-CM | POA: Diagnosis not present

## 2023-12-04 DIAGNOSIS — S72142D Displaced intertrochanteric fracture of left femur, subsequent encounter for closed fracture with routine healing: Secondary | ICD-10-CM | POA: Diagnosis not present

## 2023-12-05 DIAGNOSIS — S72142D Displaced intertrochanteric fracture of left femur, subsequent encounter for closed fracture with routine healing: Secondary | ICD-10-CM | POA: Diagnosis not present

## 2023-12-05 DIAGNOSIS — M8000XD Age-related osteoporosis with current pathological fracture, unspecified site, subsequent encounter for fracture with routine healing: Secondary | ICD-10-CM | POA: Diagnosis not present

## 2023-12-05 DIAGNOSIS — I10 Essential (primary) hypertension: Secondary | ICD-10-CM | POA: Diagnosis not present

## 2023-12-05 DIAGNOSIS — Z85850 Personal history of malignant neoplasm of thyroid: Secondary | ICD-10-CM | POA: Diagnosis not present

## 2023-12-05 DIAGNOSIS — D649 Anemia, unspecified: Secondary | ICD-10-CM | POA: Diagnosis not present

## 2023-12-05 DIAGNOSIS — F39 Unspecified mood [affective] disorder: Secondary | ICD-10-CM | POA: Diagnosis not present

## 2023-12-05 DIAGNOSIS — E039 Hypothyroidism, unspecified: Secondary | ICD-10-CM | POA: Diagnosis not present

## 2023-12-05 DIAGNOSIS — Z86711 Personal history of pulmonary embolism: Secondary | ICD-10-CM | POA: Diagnosis not present

## 2023-12-05 DIAGNOSIS — K5900 Constipation, unspecified: Secondary | ICD-10-CM | POA: Diagnosis not present

## 2023-12-05 DIAGNOSIS — G894 Chronic pain syndrome: Secondary | ICD-10-CM | POA: Diagnosis not present

## 2023-12-05 DIAGNOSIS — E1165 Type 2 diabetes mellitus with hyperglycemia: Secondary | ICD-10-CM | POA: Diagnosis not present

## 2023-12-07 DIAGNOSIS — K5900 Constipation, unspecified: Secondary | ICD-10-CM | POA: Diagnosis not present

## 2023-12-07 DIAGNOSIS — S72142D Displaced intertrochanteric fracture of left femur, subsequent encounter for closed fracture with routine healing: Secondary | ICD-10-CM | POA: Diagnosis not present

## 2023-12-07 DIAGNOSIS — Z85850 Personal history of malignant neoplasm of thyroid: Secondary | ICD-10-CM | POA: Diagnosis not present

## 2023-12-07 DIAGNOSIS — R600 Localized edema: Secondary | ICD-10-CM | POA: Diagnosis not present

## 2023-12-07 DIAGNOSIS — F39 Unspecified mood [affective] disorder: Secondary | ICD-10-CM | POA: Diagnosis not present

## 2023-12-07 DIAGNOSIS — S72142A Displaced intertrochanteric fracture of left femur, initial encounter for closed fracture: Secondary | ICD-10-CM | POA: Diagnosis not present

## 2023-12-07 DIAGNOSIS — I10 Essential (primary) hypertension: Secondary | ICD-10-CM | POA: Diagnosis not present

## 2023-12-07 DIAGNOSIS — D649 Anemia, unspecified: Secondary | ICD-10-CM | POA: Diagnosis not present

## 2023-12-07 DIAGNOSIS — Z471 Aftercare following joint replacement surgery: Secondary | ICD-10-CM | POA: Diagnosis not present

## 2023-12-07 DIAGNOSIS — M8000XD Age-related osteoporosis with current pathological fracture, unspecified site, subsequent encounter for fracture with routine healing: Secondary | ICD-10-CM | POA: Diagnosis not present

## 2023-12-07 DIAGNOSIS — E1165 Type 2 diabetes mellitus with hyperglycemia: Secondary | ICD-10-CM | POA: Diagnosis not present

## 2023-12-07 DIAGNOSIS — W19XXXD Unspecified fall, subsequent encounter: Secondary | ICD-10-CM | POA: Diagnosis not present

## 2023-12-07 DIAGNOSIS — Z86711 Personal history of pulmonary embolism: Secondary | ICD-10-CM | POA: Diagnosis not present

## 2023-12-07 DIAGNOSIS — E039 Hypothyroidism, unspecified: Secondary | ICD-10-CM | POA: Diagnosis not present

## 2023-12-10 DIAGNOSIS — F39 Unspecified mood [affective] disorder: Secondary | ICD-10-CM | POA: Diagnosis not present

## 2023-12-10 DIAGNOSIS — E1165 Type 2 diabetes mellitus with hyperglycemia: Secondary | ICD-10-CM | POA: Diagnosis not present

## 2023-12-10 DIAGNOSIS — R1310 Dysphagia, unspecified: Secondary | ICD-10-CM | POA: Diagnosis not present

## 2023-12-10 DIAGNOSIS — S72142D Displaced intertrochanteric fracture of left femur, subsequent encounter for closed fracture with routine healing: Secondary | ICD-10-CM | POA: Diagnosis not present

## 2023-12-10 DIAGNOSIS — W19XXXD Unspecified fall, subsequent encounter: Secondary | ICD-10-CM | POA: Diagnosis not present

## 2023-12-10 DIAGNOSIS — D649 Anemia, unspecified: Secondary | ICD-10-CM | POA: Diagnosis not present

## 2023-12-10 DIAGNOSIS — M8000XD Age-related osteoporosis with current pathological fracture, unspecified site, subsequent encounter for fracture with routine healing: Secondary | ICD-10-CM | POA: Diagnosis not present

## 2023-12-10 DIAGNOSIS — E039 Hypothyroidism, unspecified: Secondary | ICD-10-CM | POA: Diagnosis not present

## 2023-12-10 DIAGNOSIS — R600 Localized edema: Secondary | ICD-10-CM | POA: Diagnosis not present

## 2023-12-10 DIAGNOSIS — Z86711 Personal history of pulmonary embolism: Secondary | ICD-10-CM | POA: Diagnosis not present

## 2023-12-10 DIAGNOSIS — K5900 Constipation, unspecified: Secondary | ICD-10-CM | POA: Diagnosis not present

## 2023-12-10 DIAGNOSIS — I10 Essential (primary) hypertension: Secondary | ICD-10-CM | POA: Diagnosis not present

## 2023-12-12 DIAGNOSIS — M8000XD Age-related osteoporosis with current pathological fracture, unspecified site, subsequent encounter for fracture with routine healing: Secondary | ICD-10-CM | POA: Diagnosis not present

## 2023-12-12 DIAGNOSIS — K219 Gastro-esophageal reflux disease without esophagitis: Secondary | ICD-10-CM | POA: Diagnosis not present

## 2023-12-12 DIAGNOSIS — S72142D Displaced intertrochanteric fracture of left femur, subsequent encounter for closed fracture with routine healing: Secondary | ICD-10-CM | POA: Diagnosis not present

## 2023-12-12 DIAGNOSIS — E1165 Type 2 diabetes mellitus with hyperglycemia: Secondary | ICD-10-CM | POA: Diagnosis not present

## 2023-12-12 DIAGNOSIS — Z86711 Personal history of pulmonary embolism: Secondary | ICD-10-CM | POA: Diagnosis not present

## 2023-12-12 DIAGNOSIS — R600 Localized edema: Secondary | ICD-10-CM | POA: Diagnosis not present

## 2023-12-12 DIAGNOSIS — E039 Hypothyroidism, unspecified: Secondary | ICD-10-CM | POA: Diagnosis not present

## 2023-12-12 DIAGNOSIS — I10 Essential (primary) hypertension: Secondary | ICD-10-CM | POA: Diagnosis not present

## 2023-12-12 DIAGNOSIS — R1310 Dysphagia, unspecified: Secondary | ICD-10-CM | POA: Diagnosis not present

## 2023-12-12 DIAGNOSIS — D649 Anemia, unspecified: Secondary | ICD-10-CM | POA: Diagnosis not present

## 2023-12-12 DIAGNOSIS — K5900 Constipation, unspecified: Secondary | ICD-10-CM | POA: Diagnosis not present

## 2023-12-12 DIAGNOSIS — F39 Unspecified mood [affective] disorder: Secondary | ICD-10-CM | POA: Diagnosis not present

## 2023-12-14 DIAGNOSIS — E1165 Type 2 diabetes mellitus with hyperglycemia: Secondary | ICD-10-CM | POA: Diagnosis not present

## 2023-12-14 DIAGNOSIS — Z86711 Personal history of pulmonary embolism: Secondary | ICD-10-CM | POA: Diagnosis not present

## 2023-12-14 DIAGNOSIS — K219 Gastro-esophageal reflux disease without esophagitis: Secondary | ICD-10-CM | POA: Diagnosis not present

## 2023-12-14 DIAGNOSIS — M8000XD Age-related osteoporosis with current pathological fracture, unspecified site, subsequent encounter for fracture with routine healing: Secondary | ICD-10-CM | POA: Diagnosis not present

## 2023-12-14 DIAGNOSIS — R600 Localized edema: Secondary | ICD-10-CM | POA: Diagnosis not present

## 2023-12-14 DIAGNOSIS — D649 Anemia, unspecified: Secondary | ICD-10-CM | POA: Diagnosis not present

## 2023-12-14 DIAGNOSIS — F39 Unspecified mood [affective] disorder: Secondary | ICD-10-CM | POA: Diagnosis not present

## 2023-12-14 DIAGNOSIS — E039 Hypothyroidism, unspecified: Secondary | ICD-10-CM | POA: Diagnosis not present

## 2023-12-14 DIAGNOSIS — S72142D Displaced intertrochanteric fracture of left femur, subsequent encounter for closed fracture with routine healing: Secondary | ICD-10-CM | POA: Diagnosis not present

## 2023-12-14 DIAGNOSIS — R1310 Dysphagia, unspecified: Secondary | ICD-10-CM | POA: Diagnosis not present

## 2023-12-14 DIAGNOSIS — K5900 Constipation, unspecified: Secondary | ICD-10-CM | POA: Diagnosis not present

## 2023-12-14 DIAGNOSIS — I10 Essential (primary) hypertension: Secondary | ICD-10-CM | POA: Diagnosis not present

## 2023-12-26 DIAGNOSIS — C73 Malignant neoplasm of thyroid gland: Secondary | ICD-10-CM | POA: Diagnosis not present

## 2023-12-26 DIAGNOSIS — M810 Age-related osteoporosis without current pathological fracture: Secondary | ICD-10-CM | POA: Diagnosis not present

## 2023-12-26 DIAGNOSIS — S72002D Fracture of unspecified part of neck of left femur, subsequent encounter for closed fracture with routine healing: Secondary | ICD-10-CM | POA: Diagnosis not present

## 2023-12-26 DIAGNOSIS — E119 Type 2 diabetes mellitus without complications: Secondary | ICD-10-CM | POA: Diagnosis not present

## 2023-12-27 DIAGNOSIS — E039 Hypothyroidism, unspecified: Secondary | ICD-10-CM | POA: Diagnosis not present

## 2023-12-27 DIAGNOSIS — F39 Unspecified mood [affective] disorder: Secondary | ICD-10-CM | POA: Diagnosis not present

## 2023-12-27 DIAGNOSIS — Z95828 Presence of other vascular implants and grafts: Secondary | ICD-10-CM | POA: Diagnosis not present

## 2023-12-27 DIAGNOSIS — G8929 Other chronic pain: Secondary | ICD-10-CM | POA: Diagnosis not present

## 2023-12-27 DIAGNOSIS — Z85820 Personal history of malignant melanoma of skin: Secondary | ICD-10-CM | POA: Diagnosis not present

## 2023-12-27 DIAGNOSIS — K5909 Other constipation: Secondary | ICD-10-CM | POA: Diagnosis not present

## 2023-12-27 DIAGNOSIS — E1165 Type 2 diabetes mellitus with hyperglycemia: Secondary | ICD-10-CM | POA: Diagnosis not present

## 2023-12-27 DIAGNOSIS — M80052D Age-related osteoporosis with current pathological fracture, left femur, subsequent encounter for fracture with routine healing: Secondary | ICD-10-CM | POA: Diagnosis not present

## 2023-12-27 DIAGNOSIS — Z7982 Long term (current) use of aspirin: Secondary | ICD-10-CM | POA: Diagnosis not present

## 2023-12-27 DIAGNOSIS — R1310 Dysphagia, unspecified: Secondary | ICD-10-CM | POA: Diagnosis not present

## 2023-12-27 DIAGNOSIS — E785 Hyperlipidemia, unspecified: Secondary | ICD-10-CM | POA: Diagnosis not present

## 2023-12-27 DIAGNOSIS — I10 Essential (primary) hypertension: Secondary | ICD-10-CM | POA: Diagnosis not present

## 2023-12-27 DIAGNOSIS — M1990 Unspecified osteoarthritis, unspecified site: Secondary | ICD-10-CM | POA: Diagnosis not present

## 2023-12-27 DIAGNOSIS — W19XXXD Unspecified fall, subsequent encounter: Secondary | ICD-10-CM | POA: Diagnosis not present

## 2023-12-27 DIAGNOSIS — Z556 Problems related to health literacy: Secondary | ICD-10-CM | POA: Diagnosis not present

## 2023-12-27 DIAGNOSIS — K219 Gastro-esophageal reflux disease without esophagitis: Secondary | ICD-10-CM | POA: Diagnosis not present

## 2023-12-27 DIAGNOSIS — D649 Anemia, unspecified: Secondary | ICD-10-CM | POA: Diagnosis not present

## 2023-12-27 DIAGNOSIS — S72302D Unspecified fracture of shaft of left femur, subsequent encounter for closed fracture with routine healing: Secondary | ICD-10-CM | POA: Diagnosis not present

## 2023-12-27 DIAGNOSIS — N3281 Overactive bladder: Secondary | ICD-10-CM | POA: Diagnosis not present

## 2023-12-27 DIAGNOSIS — R600 Localized edema: Secondary | ICD-10-CM | POA: Diagnosis not present

## 2023-12-27 DIAGNOSIS — Z7984 Long term (current) use of oral hypoglycemic drugs: Secondary | ICD-10-CM | POA: Diagnosis not present

## 2023-12-27 DIAGNOSIS — Z86711 Personal history of pulmonary embolism: Secondary | ICD-10-CM | POA: Diagnosis not present

## 2023-12-27 DIAGNOSIS — Z85850 Personal history of malignant neoplasm of thyroid: Secondary | ICD-10-CM | POA: Diagnosis not present

## 2023-12-27 DIAGNOSIS — F329 Major depressive disorder, single episode, unspecified: Secondary | ICD-10-CM | POA: Diagnosis not present

## 2024-01-01 DIAGNOSIS — E890 Postprocedural hypothyroidism: Secondary | ICD-10-CM | POA: Diagnosis not present

## 2024-01-01 DIAGNOSIS — M8588 Other specified disorders of bone density and structure, other site: Secondary | ICD-10-CM | POA: Diagnosis not present

## 2024-01-01 DIAGNOSIS — Z85850 Personal history of malignant neoplasm of thyroid: Secondary | ICD-10-CM | POA: Diagnosis not present

## 2024-01-01 DIAGNOSIS — Z8781 Personal history of (healed) traumatic fracture: Secondary | ICD-10-CM | POA: Diagnosis not present

## 2024-01-01 DIAGNOSIS — E119 Type 2 diabetes mellitus without complications: Secondary | ICD-10-CM | POA: Diagnosis not present

## 2024-01-07 DIAGNOSIS — Z4789 Encounter for other orthopedic aftercare: Secondary | ICD-10-CM | POA: Diagnosis not present

## 2024-01-07 DIAGNOSIS — S72142D Displaced intertrochanteric fracture of left femur, subsequent encounter for closed fracture with routine healing: Secondary | ICD-10-CM | POA: Diagnosis not present

## 2024-01-08 DIAGNOSIS — Z961 Presence of intraocular lens: Secondary | ICD-10-CM | POA: Diagnosis not present

## 2024-01-08 DIAGNOSIS — H30141 Acute posterior multifocal placoid pigment epitheliopathy, right eye: Secondary | ICD-10-CM | POA: Diagnosis not present

## 2024-01-08 DIAGNOSIS — E119 Type 2 diabetes mellitus without complications: Secondary | ICD-10-CM | POA: Diagnosis not present

## 2024-01-11 DIAGNOSIS — K219 Gastro-esophageal reflux disease without esophagitis: Secondary | ICD-10-CM | POA: Diagnosis not present

## 2024-01-11 DIAGNOSIS — E1165 Type 2 diabetes mellitus with hyperglycemia: Secondary | ICD-10-CM | POA: Diagnosis not present

## 2024-01-11 DIAGNOSIS — M80052D Age-related osteoporosis with current pathological fracture, left femur, subsequent encounter for fracture with routine healing: Secondary | ICD-10-CM | POA: Diagnosis not present

## 2024-01-11 DIAGNOSIS — E039 Hypothyroidism, unspecified: Secondary | ICD-10-CM | POA: Diagnosis not present

## 2024-01-11 DIAGNOSIS — F329 Major depressive disorder, single episode, unspecified: Secondary | ICD-10-CM | POA: Diagnosis not present

## 2024-01-11 DIAGNOSIS — G8929 Other chronic pain: Secondary | ICD-10-CM | POA: Diagnosis not present

## 2024-01-11 DIAGNOSIS — I10 Essential (primary) hypertension: Secondary | ICD-10-CM | POA: Diagnosis not present

## 2024-01-18 DIAGNOSIS — S72142D Displaced intertrochanteric fracture of left femur, subsequent encounter for closed fracture with routine healing: Secondary | ICD-10-CM | POA: Diagnosis not present

## 2024-01-21 DIAGNOSIS — S72142D Displaced intertrochanteric fracture of left femur, subsequent encounter for closed fracture with routine healing: Secondary | ICD-10-CM | POA: Diagnosis not present

## 2024-01-23 DIAGNOSIS — M25552 Pain in left hip: Secondary | ICD-10-CM | POA: Diagnosis not present

## 2024-01-23 DIAGNOSIS — S72142D Displaced intertrochanteric fracture of left femur, subsequent encounter for closed fracture with routine healing: Secondary | ICD-10-CM | POA: Diagnosis not present

## 2024-01-23 DIAGNOSIS — M25652 Stiffness of left hip, not elsewhere classified: Secondary | ICD-10-CM | POA: Diagnosis not present

## 2024-01-30 DIAGNOSIS — S72142D Displaced intertrochanteric fracture of left femur, subsequent encounter for closed fracture with routine healing: Secondary | ICD-10-CM | POA: Diagnosis not present

## 2024-01-30 DIAGNOSIS — M25652 Stiffness of left hip, not elsewhere classified: Secondary | ICD-10-CM | POA: Diagnosis not present

## 2024-01-30 DIAGNOSIS — M25552 Pain in left hip: Secondary | ICD-10-CM | POA: Diagnosis not present

## 2024-02-01 DIAGNOSIS — M25552 Pain in left hip: Secondary | ICD-10-CM | POA: Diagnosis not present

## 2024-02-01 DIAGNOSIS — S72142D Displaced intertrochanteric fracture of left femur, subsequent encounter for closed fracture with routine healing: Secondary | ICD-10-CM | POA: Diagnosis not present

## 2024-02-01 DIAGNOSIS — M25652 Stiffness of left hip, not elsewhere classified: Secondary | ICD-10-CM | POA: Diagnosis not present

## 2024-02-05 DIAGNOSIS — D045 Carcinoma in situ of skin of trunk: Secondary | ICD-10-CM | POA: Diagnosis not present

## 2024-02-05 DIAGNOSIS — D2262 Melanocytic nevi of left upper limb, including shoulder: Secondary | ICD-10-CM | POA: Diagnosis not present

## 2024-02-05 DIAGNOSIS — D2271 Melanocytic nevi of right lower limb, including hip: Secondary | ICD-10-CM | POA: Diagnosis not present

## 2024-02-05 DIAGNOSIS — Z85820 Personal history of malignant melanoma of skin: Secondary | ICD-10-CM | POA: Diagnosis not present

## 2024-02-05 DIAGNOSIS — D2261 Melanocytic nevi of right upper limb, including shoulder: Secondary | ICD-10-CM | POA: Diagnosis not present

## 2024-02-05 DIAGNOSIS — Z85828 Personal history of other malignant neoplasm of skin: Secondary | ICD-10-CM | POA: Diagnosis not present

## 2024-02-05 DIAGNOSIS — D2272 Melanocytic nevi of left lower limb, including hip: Secondary | ICD-10-CM | POA: Diagnosis not present

## 2024-02-06 DIAGNOSIS — S72142D Displaced intertrochanteric fracture of left femur, subsequent encounter for closed fracture with routine healing: Secondary | ICD-10-CM | POA: Diagnosis not present

## 2024-02-08 DIAGNOSIS — S72142D Displaced intertrochanteric fracture of left femur, subsequent encounter for closed fracture with routine healing: Secondary | ICD-10-CM | POA: Diagnosis not present

## 2024-02-13 DIAGNOSIS — Z96698 Presence of other orthopedic joint implants: Secondary | ICD-10-CM | POA: Diagnosis not present

## 2024-02-13 DIAGNOSIS — M25652 Stiffness of left hip, not elsewhere classified: Secondary | ICD-10-CM | POA: Diagnosis not present

## 2024-02-13 DIAGNOSIS — S72142D Displaced intertrochanteric fracture of left femur, subsequent encounter for closed fracture with routine healing: Secondary | ICD-10-CM | POA: Diagnosis not present

## 2024-02-15 DIAGNOSIS — S72142D Displaced intertrochanteric fracture of left femur, subsequent encounter for closed fracture with routine healing: Secondary | ICD-10-CM | POA: Diagnosis not present

## 2024-02-20 DIAGNOSIS — R42 Dizziness and giddiness: Secondary | ICD-10-CM | POA: Diagnosis not present

## 2024-02-20 DIAGNOSIS — J019 Acute sinusitis, unspecified: Secondary | ICD-10-CM | POA: Diagnosis not present

## 2024-02-20 DIAGNOSIS — S72142D Displaced intertrochanteric fracture of left femur, subsequent encounter for closed fracture with routine healing: Secondary | ICD-10-CM | POA: Diagnosis not present

## 2024-02-20 DIAGNOSIS — H6993 Unspecified Eustachian tube disorder, bilateral: Secondary | ICD-10-CM | POA: Diagnosis not present

## 2024-02-20 DIAGNOSIS — R8271 Bacteriuria: Secondary | ICD-10-CM | POA: Diagnosis not present

## 2024-02-20 DIAGNOSIS — H1033 Unspecified acute conjunctivitis, bilateral: Secondary | ICD-10-CM | POA: Diagnosis not present

## 2024-02-20 DIAGNOSIS — B9689 Other specified bacterial agents as the cause of diseases classified elsewhere: Secondary | ICD-10-CM | POA: Diagnosis not present

## 2024-02-22 DIAGNOSIS — S72142D Displaced intertrochanteric fracture of left femur, subsequent encounter for closed fracture with routine healing: Secondary | ICD-10-CM | POA: Diagnosis not present

## 2024-02-26 DIAGNOSIS — S72142D Displaced intertrochanteric fracture of left femur, subsequent encounter for closed fracture with routine healing: Secondary | ICD-10-CM | POA: Diagnosis not present

## 2024-02-28 DIAGNOSIS — S72142D Displaced intertrochanteric fracture of left femur, subsequent encounter for closed fracture with routine healing: Secondary | ICD-10-CM | POA: Diagnosis not present

## 2024-03-04 DIAGNOSIS — S72142D Displaced intertrochanteric fracture of left femur, subsequent encounter for closed fracture with routine healing: Secondary | ICD-10-CM | POA: Diagnosis not present

## 2024-03-06 DIAGNOSIS — S72142D Displaced intertrochanteric fracture of left femur, subsequent encounter for closed fracture with routine healing: Secondary | ICD-10-CM | POA: Diagnosis not present

## 2024-03-14 DIAGNOSIS — S72142D Displaced intertrochanteric fracture of left femur, subsequent encounter for closed fracture with routine healing: Secondary | ICD-10-CM | POA: Diagnosis not present

## 2024-03-17 DIAGNOSIS — M25652 Stiffness of left hip, not elsewhere classified: Secondary | ICD-10-CM | POA: Diagnosis not present

## 2024-03-17 DIAGNOSIS — S72142D Displaced intertrochanteric fracture of left femur, subsequent encounter for closed fracture with routine healing: Secondary | ICD-10-CM | POA: Diagnosis not present

## 2024-03-19 DIAGNOSIS — S72142D Displaced intertrochanteric fracture of left femur, subsequent encounter for closed fracture with routine healing: Secondary | ICD-10-CM | POA: Diagnosis not present

## 2024-04-01 ENCOUNTER — Encounter: Payer: Self-pay | Admitting: Urology

## 2024-04-03 DIAGNOSIS — Z96652 Presence of left artificial knee joint: Secondary | ICD-10-CM | POA: Diagnosis not present

## 2024-04-07 DIAGNOSIS — E119 Type 2 diabetes mellitus without complications: Secondary | ICD-10-CM | POA: Diagnosis not present

## 2024-04-07 DIAGNOSIS — Z23 Encounter for immunization: Secondary | ICD-10-CM | POA: Diagnosis not present

## 2024-04-07 DIAGNOSIS — E890 Postprocedural hypothyroidism: Secondary | ICD-10-CM | POA: Diagnosis not present

## 2024-04-07 DIAGNOSIS — Z8781 Personal history of (healed) traumatic fracture: Secondary | ICD-10-CM | POA: Diagnosis not present

## 2024-04-07 DIAGNOSIS — Z85850 Personal history of malignant neoplasm of thyroid: Secondary | ICD-10-CM | POA: Diagnosis not present

## 2024-04-08 DIAGNOSIS — E039 Hypothyroidism, unspecified: Secondary | ICD-10-CM | POA: Diagnosis not present

## 2024-04-08 DIAGNOSIS — E119 Type 2 diabetes mellitus without complications: Secondary | ICD-10-CM | POA: Diagnosis not present

## 2024-04-08 DIAGNOSIS — I10 Essential (primary) hypertension: Secondary | ICD-10-CM | POA: Diagnosis not present

## 2024-04-08 DIAGNOSIS — G43909 Migraine, unspecified, not intractable, without status migrainosus: Secondary | ICD-10-CM | POA: Diagnosis not present

## 2024-04-19 ENCOUNTER — Other Ambulatory Visit: Payer: Self-pay

## 2024-04-19 ENCOUNTER — Emergency Department
Admission: EM | Admit: 2024-04-19 | Discharge: 2024-04-19 | Disposition: A | Discharge status: Home / Self Care | Attending: Emergency Medicine | Admitting: Emergency Medicine

## 2024-04-19 DIAGNOSIS — H1033 Unspecified acute conjunctivitis, bilateral: Secondary | ICD-10-CM | POA: Insufficient documentation

## 2024-04-19 DIAGNOSIS — H1032 Unspecified acute conjunctivitis, left eye: Secondary | ICD-10-CM | POA: Diagnosis not present

## 2024-04-19 DIAGNOSIS — B9689 Other specified bacterial agents as the cause of diseases classified elsewhere: Secondary | ICD-10-CM | POA: Diagnosis not present

## 2024-04-19 DIAGNOSIS — X58XXXA Exposure to other specified factors, initial encounter: Secondary | ICD-10-CM | POA: Diagnosis not present

## 2024-04-19 DIAGNOSIS — S0502XA Injury of conjunctiva and corneal abrasion without foreign body, left eye, initial encounter: Secondary | ICD-10-CM | POA: Diagnosis not present

## 2024-04-19 DIAGNOSIS — Z96659 Presence of unspecified artificial knee joint: Secondary | ICD-10-CM | POA: Insufficient documentation

## 2024-04-19 DIAGNOSIS — E119 Type 2 diabetes mellitus without complications: Secondary | ICD-10-CM | POA: Diagnosis not present

## 2024-04-19 MED ORDER — ERYTHROMYCIN 5 MG/GM OP OINT: 1.0000 | TOPICAL_OINTMENT | Freq: Four times a day (QID) | OPHTHALMIC | 0 refills | Status: AC

## 2024-04-19 MED ORDER — TETRACAINE HCL 0.5 % OP SOLN
1.0000 [drp] | Freq: Once | OPHTHALMIC | Status: AC
  Administered 2024-04-19: 1 [drp] via OPHTHALMIC
  Filled 2024-04-19: qty 4

## 2024-04-19 MED ORDER — ERYTHROMYCIN 5 MG/GM OP OINT
TOPICAL_OINTMENT | Freq: Once | OPHTHALMIC | Status: AC
  Administered 2024-04-19: 1 via OPHTHALMIC
  Filled 2024-04-19: qty 1

## 2024-04-19 MED ORDER — FLUORESCEIN SODIUM 1 MG OP STRP
1.0000 | ORAL_STRIP | Freq: Once | OPHTHALMIC | Status: AC
  Administered 2024-04-19: 1 via OPHTHALMIC
  Filled 2024-04-19: qty 1

## 2024-04-19 MED ORDER — ERYTHROMYCIN 5 MG/GM OP OINT: 1.0000 | TOPICAL_OINTMENT | Freq: Four times a day (QID) | OPHTHALMIC | 0 refills | Status: DC

## 2024-04-19 NOTE — Discharge Instructions (Signed)
 You have been diagnosed with bacterial conjunctivitis.  Please apply erythromycin ointment 4 times a day.  Please apply warm compress on your eyes.  Please wash your hands before touching your eyes.  You can call Dr. Mittie and make an appointment on Monday for a follow-up.  Please come back to ED with your PCP if you have new symptoms or symptoms worsen.

## 2024-04-19 NOTE — ED Notes (Signed)
 Patient unable to read any letters on the eye chart; reports normally 20/20 bilaterally w/her glasses

## 2024-04-19 NOTE — ED Triage Notes (Signed)
 Patient c/o bilateral eye pain/drainage beginng yesterday. Reports feels like she has grit in her eyes.

## 2024-04-19 NOTE — ED Provider Notes (Signed)
 [facility] Provider Note    Event Date/Time   First MD Initiated Contact with Patient 04/19/24 1751     (approximate)   History   Eye Pain    HPI  Alisha Sanchez is a 68 y.o. female    with a past medical history of diabetes type 2, total knee replacement, closed intertrochanteric fracture, who presents to the ED complaining of bilateral eye pain, drainage. According to the patient, notes started this morning with bilateral drainage from her eyes, itchiness, photophobia, sensation of foreign body in both eyes, and peripheral edema.  Patient does use contact lenses.  Patient is diabetic, and she reports having this kind of infections very often lately.  Patient is here with her husband.    ROS: Patient currently denies any vision changes, tinnitus, difficulty speaking, facial droop, sore throat, chest pain, shortness of breath, abdominal pain, nausea/vomiting/diarrhea, dysuria, or weakness/numbness/paresthesias in any extremity   Physical Exam   Triage Vital Signs: ED Triage Vitals  Encounter Vitals Group     BP 04/19/24 1734 134/84     Girls Systolic BP Percentile --      Girls Diastolic BP Percentile --      Boys Systolic BP Percentile --      Boys Diastolic BP Percentile --      Pulse Rate 04/19/24 1734 64     Resp 04/19/24 1734 17     Temp 04/19/24 1734 98 F (36.7 C)     Temp Source 04/19/24 1734 Oral     SpO2 04/19/24 1734 100 %     Weight 04/19/24 1731 178 lb 9.2 oz (81 kg)     Height 04/19/24 1731 5' 7 (1.702 m)     Head Circumference --      Peak Flow --      Pain Score 04/19/24 1731 8     Pain Loc --      Pain Education --      Exclude from Growth Chart --     Most recent vital signs: Vitals:   04/19/24 1734  BP: 134/84  Pulse: 64  Resp: 17  Temp: 98 F (36.7 C)  SpO2: 100%     Physical Exam Vitals and nursing note reviewed.   General:          Awake, no distress.  Right eye: Mild upper eyelid edema, conjunctival  injection, yellowish secretions.  PERRLA, full EOM without tenderness. Left eye: Mild upper and lower eyelid edema, conjunctival injection, yellowish secretions.  PERRLA, full EOM with without tenderness. CV:                  Good peripheral perfusion.  Resp:               Normal effort. no tachypnea Abd:                 No distention.  Soft nontender Other:               ED Results / Procedures / Treatments   Labs (all labs ordered are listed, but only abnormal results are displayed) Labs Reviewed - No data to display    PROCEDURES:  Critical Care performed:   Procedures   MEDICATIONS ORDERED IN ED: Medications  tetracaine  (PONTOCAINE) 0.5 % ophthalmic solution 1-2 drop (1 drop Both Eyes Given 04/19/24 1820)  fluorescein ophthalmic strip 1 strip (1 strip Both Eyes Given 04/19/24 1821)  erythromycin ophthalmic ointment (1 Application Both Eyes Given  04/19/24 1821)      IMPRESSION / MDM / ASSESSMENT AND PLAN / ED COURSE  I reviewed the triage vital signs and the nursing notes.  Differential diagnosis includes, but is not limited to, bacterial conjunctivitis, viral conjunctivitis, foreign body, corneal abrasion, unlikely hyphema, periorbital cellulitis  Patient's presentation is most consistent with acute, uncomplicated illness.   Alisha Sanchez is a 68 y.o., female who presents today with history of bilateral crusty drainage, sensation of foreign body, itchiness, and blurry vision since this morning.  Patient has history of diabetes, several episodes like these in the last months.  On physical exam both eyes present with upper and lower eyelid edema, bilateral crusty profuse secretions, PERRLA, full EOM without tenderness.  Photophobia.  Exam with a slit lamp of the left eye, show with intake of fluorescein at 3, right eye no intake of fluorescein.  I did order and applied erythromycin for both eyes due to pharmacy hours. Patient's diagnosis is consistent with bacterial  conjunctivitis. I did not order any imaging labs, physical exam is reassuring I did review the patient's allergies and medications.The patient is in stable and satisfactory condition for discharge home  Patient will be discharged home with prescriptions for erythromycin ointment. Patient is to follow up with dermatology on Monday as needed or otherwise directed. Patient is given ED precautions to return to the ED for any worsening or new symptoms. Discussed plan of care with patient, answered all of patient's questions, Patient agreeable to plan of care. Advised patient to take medications according to the instructions on the label. Discussed possible side effects of new medications. Patient verbalized understanding.  FINAL CLINICAL IMPRESSION(S) / ED DIAGNOSES   Final diagnoses:  Acute bacterial conjunctivitis of both eyes  Abrasion of left cornea, initial encounter     Rx / DC Orders   ED Discharge Orders          Ordered    erythromycin ophthalmic ointment  4 times daily        04/19/24 1825             Note:  This document was prepared using Dragon voice recognition software and may include unintentional dictation errors.   Janit Kast, PA-C 04/19/24 1826    Levander Slate, MD 04/19/24 1904

## 2024-05-27 DIAGNOSIS — H26492 Other secondary cataract, left eye: Secondary | ICD-10-CM | POA: Diagnosis not present

## 2024-05-27 DIAGNOSIS — H35373 Puckering of macula, bilateral: Secondary | ICD-10-CM | POA: Diagnosis not present

## 2024-05-27 DIAGNOSIS — Z961 Presence of intraocular lens: Secondary | ICD-10-CM | POA: Diagnosis not present

## 2024-05-27 DIAGNOSIS — H538 Other visual disturbances: Secondary | ICD-10-CM | POA: Diagnosis not present

## 2024-06-19 ENCOUNTER — Encounter: Payer: Self-pay | Admitting: Urology

## 2024-09-16 ENCOUNTER — Ambulatory Visit: Admitting: Urology

## 2024-09-23 ENCOUNTER — Ambulatory Visit: Admitting: Urology
